# Patient Record
Sex: Male | Born: 1960 | Race: Black or African American | Hispanic: No | Marital: Single | Smoking: Never smoker
Health system: Southern US, Community
[De-identification: ages and names within clinical notes are randomized; demographics above are authoritative.]

## PROBLEM LIST (undated history)

## (undated) DIAGNOSIS — F29 Unspecified psychosis not due to a substance or known physiological condition: Secondary | ICD-10-CM

## (undated) DIAGNOSIS — I10 Essential (primary) hypertension: Secondary | ICD-10-CM

## (undated) DIAGNOSIS — R569 Unspecified convulsions: Secondary | ICD-10-CM

## (undated) DIAGNOSIS — E119 Type 2 diabetes mellitus without complications: Secondary | ICD-10-CM

## (undated) DIAGNOSIS — E785 Hyperlipidemia, unspecified: Secondary | ICD-10-CM

---

## 2012-02-02 ENCOUNTER — Ambulatory Visit: Payer: Self-pay | Admitting: Family Medicine

## 2015-10-24 ENCOUNTER — Emergency Department
Admission: EM | Admit: 2015-10-24 | Discharge: 2015-10-24 | Disposition: A | Discharge status: Home / Self Care | Payer: Medicare Other | Attending: Emergency Medicine | Admitting: Emergency Medicine

## 2015-10-24 DIAGNOSIS — L0291 Cutaneous abscess, unspecified: Secondary | ICD-10-CM

## 2015-10-24 DIAGNOSIS — L02413 Cutaneous abscess of right upper limb: Secondary | ICD-10-CM | POA: Insufficient documentation

## 2015-10-24 MED ORDER — LIDOCAINE-EPINEPHRINE (PF) 1 %-1:200000 IJ SOLN
30.0000 mL | Freq: Once | INTRAMUSCULAR | Status: DC
  Filled 2015-10-24: qty 30

## 2015-10-24 NOTE — ED Notes (Signed)
Abscess noted to right elbow. Pt caregiver states first noted 10/21/15. Today PCP recommended evaluation by ED.

## 2015-10-24 NOTE — ED Provider Notes (Signed)
[facility] Emergency Department Provider Note  ____________________________________________  Time seen: Approximately 4:26 PM  I have reviewed the triage vital signs and the nursing notes.   HISTORY  Chief Complaint Abscess   HPI Jason Reed is a 55 y.o. male who presents to the emergency department for evaluation of an abscess to the right elbow. He was evaluated at [facility] at prescribed Bactrim DS, which he started 2 days ago. Caregiver states that there has been no improvement and the area actually looks worse. He was evaluated again today by Dr. Lennox Grumbles and sent to the ER for I&D.  History reviewed. No pertinent past medical history.  There are no active problems to display for this patient.   History reviewed. No pertinent past surgical history.  No current outpatient prescriptions on file.  Allergies Review of patient's allergies indicates no known allergies.  No family history on file.  Social History Social History  Substance Use Topics  . Smoking status: Never Smoker   . Smokeless tobacco: None  . Alcohol Use: No    Review of Systems  Constitutional: Negative for fever/chills Respiratory: Negative for shortness of breath. Musculoskeletal: Positive for pain. Skin: Positive for abscess. Neurological: Negative for headaches, focal weakness or numbness. ____________________________________________   PHYSICAL EXAM:  VITAL SIGNS: ED Triage Vitals  Enc Vitals Group     BP 10/24/15 1551 145/83 mmHg     Pulse Rate 10/24/15 1551 95     Resp 10/24/15 1551 18     Temp 10/24/15 1551 97.9 F (36.6 C)     Temp Source 10/24/15 1551 Oral     SpO2 10/24/15 1551 100 %     Weight 10/24/15 1551 253 lb (114.76 kg)     Height 10/24/15 1551 6' (1.829 m)     Head Cir --      Peak Flow --      Pain Score 10/24/15 1559 7     Pain Loc --      Pain Edu? --      Excl. in [place]? --      Constitutional: Alert and oriented. Well appearing  and in no acute distress. Eyes: Conjunctivae are normal. EOMI. Nose: No congestion/rhinnorhea. Mouth/Throat: Mucous membranes are moist.   Neck: No stridor. Cardiovascular: Good peripheral circulation. Respiratory: Normal respiratory effort.  No retractions. Musculoskeletal: Limited ROM of right elbow secondary to pain. Neurologic:  Normal speech and language. No gross focal neurologic deficits are appreciated. Skin:  Large, draining abscess over the olecranon process of the right elbow with surrounding erythema and mild, localized induration on the anterior aspect of the right forearm.  ____________________________________________   LABS (all labs ordered are listed, but only abnormal results are displayed)  Labs Reviewed - No data to display ____________________________________________  EKG   ____________________________________________  RADIOLOGY  Not indicated. ____________________________________________   PROCEDURES  Procedure(s) performed:  INCISION AND DRAINAGE Performed by: Sherrie George Consent: Verbal consent obtained. Risks and benefits: risks, benefits and alternatives were discussed Type: abscess  Body area: right elbow  Anesthesia: local infiltration  Incision was made with a scalpel.  Local anesthetic: lidocaine 1% with epinephrine  Anesthetic total: 5 ml  Complexity: complex Blunt dissection to break up loculations  Drainage: purulent  Drainage amount: large  Packing material: none  Patient tolerance: Patient tolerated the procedure well with no immediate complications.    ____________________________________________   INITIAL IMPRESSION / ASSESSMENT AND PLAN / ED COURSE  Pertinent labs & imaging results that were available  during my care of the patient were reviewed by me and considered in my medical decision making (see chart for details).  He will be advised to finish the Bactrim prescribed by PCP.  He was advised to follow up  with PCP for wound check in 2 days.  He was also advised to return to the emergency department for symptoms that change or worsen if unable to schedule an appointment.  ____________________________________________   FINAL CLINICAL IMPRESSION(S) / ED DIAGNOSES  Final diagnoses:  Abscess    New Prescriptions   No medications on file     Victorino Dike, FNP 10/24/15 [place], MD 10/24/15 2007

## 2015-10-24 NOTE — Discharge Instructions (Signed)
Abscess °An abscess is an infected area that contains a collection of pus and debris. It can occur in almost any part of the body. An abscess is also known as a furuncle or boil. °CAUSES  °An abscess occurs when tissue gets infected. This can occur from blockage of oil or sweat glands, infection of hair follicles, or a minor injury to the skin. As the body tries to fight the infection, pus collects in the area and creates pressure under the skin. This pressure causes pain. People with weakened immune systems have difficulty fighting infections and get certain abscesses more often.  °SYMPTOMS °Usually an abscess develops on the skin and becomes a painful mass that is red, warm, and tender. If the abscess forms under the skin, you may feel a moveable soft area under the skin. Some abscesses break open (rupture) on their own, but most will continue to get worse without care. The infection can spread deeper into the body and eventually into the bloodstream, causing you to feel ill.  °DIAGNOSIS  °Your caregiver will take your medical history and perform a physical exam. A sample of fluid may also be taken from the abscess to determine what is causing your infection. °TREATMENT  °Your caregiver may prescribe antibiotic medicines to fight the infection. However, taking antibiotics alone usually does not cure an abscess. Your caregiver may need to make a small cut (incision) in the abscess to drain the pus. In some cases, gauze is packed into the abscess to reduce pain and to continue draining the area. °HOME CARE INSTRUCTIONS  °· Only take over-the-counter or prescription medicines for pain, discomfort, or fever as directed by your caregiver. °· If you were prescribed antibiotics, take them as directed. Finish them even if you start to feel better. °· If gauze is used, follow your caregiver's directions for changing the gauze. °· To avoid spreading the infection: °· Keep your draining abscess covered with a  bandage. °· Wash your hands well. °· Do not share personal care items, towels, or whirlpools with others. °· Avoid skin contact with others. °· Keep your skin and clothes clean around the abscess. °· Keep all follow-up appointments as directed by your caregiver. °SEEK MEDICAL CARE IF:  °· You have increased pain, swelling, redness, fluid drainage, or bleeding. °· You have muscle aches, chills, or a general ill feeling. °· You have a fever. °MAKE SURE YOU:  °· Understand these instructions. °· Will watch your condition. °· Will get help right away if you are not doing well or get worse. °  °This information is not intended to replace advice given to you by your health care provider. Make sure you discuss any questions you have with your health care provider. °  °Document Released: 01/28/2005 Document Revised: 10/20/2011 Document Reviewed: 07/03/2011 °Elsevier Interactive Patient Education ©2016 [employer] ° °Incision and Drainage °Incision and drainage is a procedure in which a sac-like structure (cystic structure) is opened and drained. The area to be drained usually contains material such as pus, fluid, or blood.  °LET YOUR CAREGIVER KNOW ABOUT:  °· Allergies to medicine. °· Medicines taken, including vitamins, herbs, eyedrops, over-the-counter medicines, and creams. °· Use of steroids (by mouth or creams). °· Previous problems with anesthetics or numbing medicines. °· History of bleeding problems or blood clots. °· Previous surgery. °· Other health problems, including diabetes and kidney problems. °· Possibility of pregnancy, if this applies. °RISKS AND COMPLICATIONS °· Pain. °· Bleeding. °· Scarring. °· Infection. °BEFORE THE PROCEDURE  °  You may need to have an ultrasound or other imaging tests to see how large or deep your cystic structure is. Blood tests may also be used to determine if you have an infection or how severe the infection is. You may need to have a tetanus shot. °PROCEDURE  °The affected area  is cleaned with a cleaning fluid. The cyst area will then be numbed with a medicine (local anesthetic). A small incision will be made in the cystic structure. A syringe or catheter may be used to drain the contents of the cystic structure, or the contents may be squeezed out. The area will then be flushed with a cleansing solution. After cleansing the area, it is often gently packed with a gauze or another wound dressing. Once it is packed, it will be covered with gauze and tape or some other type of wound dressing.  °AFTER THE PROCEDURE  °· Often, you will be allowed to go home right after the procedure. °· You may be given antibiotic medicine to prevent or heal an infection. °· If the area was packed with gauze or some other wound dressing, you will likely need to come back in 1 to 2 days to get it removed. °· The area should heal in about 14 days. °  °This information is not intended to replace advice given to you by your health care provider. Make sure you discuss any questions you have with your health care provider. °  °Document Released: 10/14/2000 Document Revised: 10/20/2011 Document Reviewed: 06/15/2011 °Elsevier Interactive Patient Education ©2016 Elsevier Inc. ° °

## 2015-10-24 NOTE — ED Notes (Addendum)
Pt arrives to ER via POV c/o right sided elbow abscess. Pt seen at [facility] 5 days ago and prescribed antibiotics. Pt elbow draining white and light yellow drainage, thick drainage, no redness at this time. No redness at site. Pt alert and oriented X4, active, cooperative, pt in NAD. RR even and unlabored, color WNL.

## 2017-01-08 ENCOUNTER — Inpatient Hospital Stay: Payer: Medicare Other

## 2017-01-08 ENCOUNTER — Inpatient Hospital Stay
Admission: EM | Admit: 2017-01-08 | Discharge: 2017-01-12 | DRG: 871 | Disposition: A | Discharge status: Home Health | Payer: Medicare Other | Attending: Internal Medicine | Admitting: Internal Medicine

## 2017-01-08 ENCOUNTER — Emergency Department: Payer: Medicare Other

## 2017-01-08 DIAGNOSIS — N390 Urinary tract infection, site not specified: Secondary | ICD-10-CM | POA: Diagnosis present

## 2017-01-08 DIAGNOSIS — I10 Essential (primary) hypertension: Secondary | ICD-10-CM | POA: Diagnosis present

## 2017-01-08 DIAGNOSIS — H578 Other specified disorders of eye and adnexa: Secondary | ICD-10-CM | POA: Diagnosis present

## 2017-01-08 DIAGNOSIS — R6521 Severe sepsis with septic shock: Secondary | ICD-10-CM | POA: Diagnosis present

## 2017-01-08 DIAGNOSIS — Z7984 Long term (current) use of oral hypoglycemic drugs: Secondary | ICD-10-CM

## 2017-01-08 DIAGNOSIS — Z7982 Long term (current) use of aspirin: Secondary | ICD-10-CM

## 2017-01-08 DIAGNOSIS — Z79899 Other long term (current) drug therapy: Secondary | ICD-10-CM | POA: Diagnosis not present

## 2017-01-08 DIAGNOSIS — R700 Elevated erythrocyte sedimentation rate: Secondary | ICD-10-CM | POA: Diagnosis present

## 2017-01-08 DIAGNOSIS — E860 Dehydration: Secondary | ICD-10-CM | POA: Diagnosis present

## 2017-01-08 DIAGNOSIS — A419 Sepsis, unspecified organism: Secondary | ICD-10-CM | POA: Diagnosis present

## 2017-01-08 DIAGNOSIS — B9620 Unspecified Escherichia coli [E. coli] as the cause of diseases classified elsewhere: Secondary | ICD-10-CM | POA: Diagnosis present

## 2017-01-08 DIAGNOSIS — E119 Type 2 diabetes mellitus without complications: Secondary | ICD-10-CM | POA: Diagnosis present

## 2017-01-08 DIAGNOSIS — F419 Anxiety disorder, unspecified: Secondary | ICD-10-CM | POA: Diagnosis present

## 2017-01-08 DIAGNOSIS — A09 Infectious gastroenteritis and colitis, unspecified: Secondary | ICD-10-CM | POA: Diagnosis present

## 2017-01-08 DIAGNOSIS — N179 Acute kidney failure, unspecified: Secondary | ICD-10-CM | POA: Diagnosis present

## 2017-01-08 DIAGNOSIS — R571 Hypovolemic shock: Secondary | ICD-10-CM | POA: Diagnosis present

## 2017-01-08 DIAGNOSIS — D61818 Other pancytopenia: Secondary | ICD-10-CM | POA: Diagnosis not present

## 2017-01-08 DIAGNOSIS — E872 Acidosis: Secondary | ICD-10-CM | POA: Diagnosis present

## 2017-01-08 DIAGNOSIS — R197 Diarrhea, unspecified: Secondary | ICD-10-CM | POA: Diagnosis not present

## 2017-01-08 DIAGNOSIS — G9341 Metabolic encephalopathy: Secondary | ICD-10-CM | POA: Diagnosis present

## 2017-01-08 HISTORY — DX: Essential (primary) hypertension: I10

## 2017-01-08 HISTORY — DX: Type 2 diabetes mellitus without complications: E11.9

## 2017-01-08 LAB — COMPREHENSIVE METABOLIC PANEL
ALT: 20 U/L (ref 17–63)
ALT: 25 U/L (ref 17–63)
AST: 35 U/L (ref 15–41)
AST: 42 U/L — AB (ref 15–41)
Albumin: 2.9 g/dL — ABNORMAL LOW (ref 3.5–5.0)
Albumin: 2.6 g/dL — ABNORMAL LOW (ref 3.5–5.0)
Alkaline Phosphatase: 90 U/L (ref 38–126)
Alkaline Phosphatase: 76 U/L (ref 38–126)
Anion gap: 15 (ref 5–15)
Anion gap: 7 (ref 5–15)
BUN: 26 mg/dL — ABNORMAL HIGH (ref 6–20)
BUN: 32 mg/dL — AB (ref 6–20)
CHLORIDE: 104 mmol/L (ref 101–111)
CHLORIDE: 111 mmol/L (ref 101–111)
CO2: 16 mmol/L — AB (ref 22–32)
CO2: 20 mmol/L — AB (ref 22–32)
CREATININE: 2.24 mg/dL — AB (ref 0.61–1.24)
Calcium: 7.6 mg/dL — ABNORMAL LOW (ref 8.9–10.3)
Calcium: 6.8 mg/dL — ABNORMAL LOW (ref 8.9–10.3)
Creatinine, Ser: 2.1 mg/dL — ABNORMAL HIGH (ref 0.61–1.24)
GFR calc Af Amer: 38 mL/min — ABNORMAL LOW (ref >60)
GFR calc non Af Amer: 31 mL/min — ABNORMAL LOW (ref >60)
GFR calc non Af Amer: 33 mL/min — ABNORMAL LOW (ref >60)
GFR, EST AFRICAN AMERICAN: 35 mL/min — AB (ref >60)
Glucose, Bld: 213 mg/dL — ABNORMAL HIGH (ref 65–99)
Glucose, Bld: 167 mg/dL — ABNORMAL HIGH (ref 65–99)
POTASSIUM: 5.1 mmol/L (ref 3.5–5.1)
Potassium: 5.4 mmol/L — ABNORMAL HIGH (ref 3.5–5.1)
SODIUM: 135 mmol/L (ref 135–145)
SODIUM: 138 mmol/L (ref 135–145)
Total Bilirubin: 1.1 mg/dL (ref 0.3–1.2)
Total Bilirubin: 1.1 mg/dL (ref 0.3–1.2)
Total Protein: 5.9 g/dL — ABNORMAL LOW (ref 6.5–8.1)
Total Protein: 5.3 g/dL — ABNORMAL LOW (ref 6.5–8.1)

## 2017-01-08 LAB — URINALYSIS, ROUTINE W REFLEX MICROSCOPIC
Glucose, UA: 150 mg/dL — AB
KETONES UR: NEGATIVE mg/dL
Nitrite: NEGATIVE
PH: 5 (ref 5.0–8.0)
Protein, ur: 30 mg/dL — AB
SQUAMOUS EPITHELIAL / LPF: NONE SEEN
Specific Gravity, Urine: 1.012 (ref 1.005–1.030)

## 2017-01-08 LAB — CBC WITH DIFFERENTIAL/PLATELET
BASOS ABS: 0.1 10*3/uL (ref 0–0.1)
Basophils Relative: 1 %
Eosinophils Absolute: 0 10*3/uL (ref 0–0.7)
Eosinophils Relative: 0 %
HCT: 36 % — ABNORMAL LOW (ref 40.0–52.0)
HEMOGLOBIN: 11.6 g/dL — AB (ref 13.0–18.0)
LYMPHS PCT: 4 %
Lymphs Abs: 0.5 10*3/uL — ABNORMAL LOW (ref 1.0–3.6)
MCH: 30.1 pg (ref 26.0–34.0)
MCHC: 32.2 g/dL (ref 32.0–36.0)
MCV: 93.7 fL (ref 80.0–100.0)
Monocytes Absolute: 1.2 10*3/uL — ABNORMAL HIGH (ref 0.2–1.0)
Monocytes Relative: 11 %
NEUTROS ABS: 9.3 10*3/uL — AB (ref 1.4–6.5)
NEUTROS PCT: 84 %
Platelets: 161 10*3/uL (ref 150–440)
RBC: 3.85 MIL/uL — ABNORMAL LOW (ref 4.40–5.90)
RDW: 16 % — ABNORMAL HIGH (ref 11.5–14.5)
WBC: 11.1 10*3/uL — AB (ref 3.8–10.6)

## 2017-01-08 LAB — LIPASE, BLOOD: Lipase: 21 U/L (ref 11–51)

## 2017-01-08 LAB — TYPE AND SCREEN
ABO/RH(D): O POS
Antibody Screen: NEGATIVE

## 2017-01-08 LAB — GASTROINTESTINAL PANEL BY PCR, STOOL (REPLACES STOOL CULTURE)

## 2017-01-08 LAB — CBC
HCT: 32 % — ABNORMAL LOW (ref 40.0–52.0)
Hemoglobin: 10.4 g/dL — ABNORMAL LOW (ref 13.0–18.0)
MCH: 29.8 pg (ref 26.0–34.0)
MCHC: 32.5 g/dL (ref 32.0–36.0)
MCV: 91.6 fL (ref 80.0–100.0)
PLATELETS: 116 10*3/uL — AB (ref 150–440)
RBC: 3.5 MIL/uL — AB (ref 4.40–5.90)
RDW: 15.4 % — ABNORMAL HIGH (ref 11.5–14.5)
WBC: 3.4 10*3/uL — AB (ref 3.8–10.6)

## 2017-01-08 LAB — PHOSPHORUS
PHOSPHORUS: 3.7 mg/dL (ref 2.5–4.6)
Phosphorus: 3.4 mg/dL (ref 2.5–4.6)

## 2017-01-08 LAB — MAGNESIUM
Magnesium: 2.3 mg/dL (ref 1.7–2.4)
Magnesium: 1.9 mg/dL (ref 1.7–2.4)

## 2017-01-08 LAB — LACTIC ACID, PLASMA
LACTIC ACID, VENOUS: 6.3 mmol/L — AB (ref 0.5–1.9)
Lactic Acid, Venous: 5.8 mmol/L (ref 0.5–1.9)
Lactic Acid, Venous: 3.9 mmol/L (ref 0.5–1.9)

## 2017-01-08 LAB — MRSA PCR SCREENING: MRSA BY PCR: NEGATIVE

## 2017-01-08 LAB — C DIFFICILE QUICK SCREEN W PCR REFLEX
C DIFFICILE (CDIFF) TOXIN: NEGATIVE
C Diff antigen: NEGATIVE
C Diff interpretation: NOT DETECTED

## 2017-01-08 LAB — PROTIME-INR
INR: 1.11
PROTHROMBIN TIME: 14.2 s (ref 11.4–15.2)

## 2017-01-08 LAB — GLUCOSE, CAPILLARY
GLUCOSE-CAPILLARY: 179 mg/dL — AB (ref 65–99)
Glucose-Capillary: 177 mg/dL — ABNORMAL HIGH (ref 65–99)

## 2017-01-08 LAB — TROPONIN I: Troponin I: <0.03 ng/mL (ref <0.03)

## 2017-01-08 MED ORDER — MIRTAZAPINE 15 MG PO TBDP
15.0000 mg | ORAL_TABLET | Freq: Every day | ORAL | Status: DC
  Administered 2017-01-08 – 2017-01-11 (×4): 15 mg via ORAL
  Filled 2017-01-08 (×5): qty 1

## 2017-01-08 MED ORDER — TRAZODONE HCL 50 MG PO TABS
50.0000 mg | ORAL_TABLET | Freq: Every day | ORAL | Status: DC
  Administered 2017-01-09 – 2017-01-11 (×3): 50 mg via ORAL
  Filled 2017-01-08 (×3): qty 1

## 2017-01-08 MED ORDER — CIPROFLOXACIN IN D5W 400 MG/200ML IV SOLN
400.0000 mg | Freq: Two times a day (BID) | INTRAVENOUS | Status: DC
  Administered 2017-01-09 – 2017-01-11 (×5): 400 mg via INTRAVENOUS
  Filled 2017-01-08 (×6): qty 200

## 2017-01-08 MED ORDER — SODIUM CHLORIDE 0.9 % IV BOLUS (SEPSIS)
1000.0000 mL | Freq: Once | INTRAVENOUS | Status: AC
  Administered 2017-01-08: 1000 mL via INTRAVENOUS

## 2017-01-08 MED ORDER — INSULIN ASPART 100 UNIT/ML ~~LOC~~ SOLN
0.0000 [IU] | Freq: Three times a day (TID) | SUBCUTANEOUS | Status: DC
  Administered 2017-01-09 – 2017-01-10 (×3): 1 [IU] via SUBCUTANEOUS
  Administered 2017-01-11: 12:00:00 2 [IU] via SUBCUTANEOUS
  Administered 2017-01-11: 18:00:00 1 [IU] via SUBCUTANEOUS
  Administered 2017-01-11: 09:00:00 2 [IU] via SUBCUTANEOUS
  Administered 2017-01-12: 09:00:00 1 [IU] via SUBCUTANEOUS
  Administered 2017-01-12: 2 [IU] via SUBCUTANEOUS
  Filled 2017-01-08 (×8): qty 1

## 2017-01-08 MED ORDER — DEXTROSE 5 % IV SOLN
0.0000 ug/min | Freq: Once | INTRAVENOUS | Status: AC
Start: 2017-01-08 — End: 2017-01-09
  Administered 2017-01-08: 5 ug/min via INTRAVENOUS
  Filled 2017-01-08: qty 4

## 2017-01-08 MED ORDER — ARIPIPRAZOLE 15 MG PO TABS
15.0000 mg | ORAL_TABLET | Freq: Every day | ORAL | Status: DC
  Administered 2017-01-09 – 2017-01-12 (×4): 15 mg via ORAL
  Filled 2017-01-08 (×4): qty 1

## 2017-01-08 MED ORDER — PHENYTOIN SODIUM EXTENDED 100 MG PO CAPS
100.0000 mg | ORAL_CAPSULE | Freq: Two times a day (BID) | ORAL | Status: DC
  Administered 2017-01-08 – 2017-01-12 (×8): 100 mg via ORAL
  Filled 2017-01-08 (×9): qty 1

## 2017-01-08 MED ORDER — ONDANSETRON HCL 4 MG PO TABS: 4.0000 mg | ORAL_TABLET | Freq: Four times a day (QID) | ORAL | Status: DC | PRN

## 2017-01-08 MED ORDER — SIMVASTATIN 20 MG PO TABS
20.0000 mg | ORAL_TABLET | Freq: Every day | ORAL | Status: DC
  Administered 2017-01-08 – 2017-01-11 (×4): 20 mg via ORAL
  Filled 2017-01-08 (×4): qty 1

## 2017-01-08 MED ORDER — GUAIFENESIN-DM 100-10 MG/5ML PO SYRP
10.0000 mL | ORAL_SOLUTION | ORAL | Status: DC | PRN
  Filled 2017-01-08: qty 10

## 2017-01-08 MED ORDER — SODIUM CHLORIDE 0.9 % IV BOLUS (SEPSIS)
1000.0000 mL | INTRAVENOUS | Status: AC
  Administered 2017-01-08 – 2017-01-09 (×2): 1000 mL via INTRAVENOUS

## 2017-01-08 MED ORDER — CIPROFLOXACIN IN D5W 400 MG/200ML IV SOLN: 400.0000 mg | Freq: Once | INTRAVENOUS | Status: DC

## 2017-01-08 MED ORDER — LORATADINE 10 MG PO TABS
10.0000 mg | ORAL_TABLET | Freq: Every day | ORAL | Status: DC
Start: 2017-01-09 — End: 2017-01-12
  Administered 2017-01-10 – 2017-01-12 (×3): 10 mg via ORAL
  Filled 2017-01-08 (×4): qty 1

## 2017-01-08 MED ORDER — QUETIAPINE FUMARATE 200 MG PO TABS
800.0000 mg | ORAL_TABLET | Freq: Every day | ORAL | Status: DC
  Administered 2017-01-09 – 2017-01-11 (×3): 800 mg via ORAL
  Filled 2017-01-08: qty 2
  Filled 2017-01-08 (×3): qty 4

## 2017-01-08 MED ORDER — ENOXAPARIN SODIUM 40 MG/0.4ML ~~LOC~~ SOLN
40.0000 mg | SUBCUTANEOUS | Status: DC
  Administered 2017-01-08 – 2017-01-11 (×4): 40 mg via SUBCUTANEOUS
  Filled 2017-01-08 (×4): qty 0.4

## 2017-01-08 MED ORDER — SODIUM CHLORIDE 0.9 % IV SOLN
600.0000 mg | Freq: Four times a day (QID) | INTRAVENOUS | Status: DC | PRN
  Administered 2017-01-08 – 2017-01-09 (×2): 600 mg via INTRAVENOUS
  Filled 2017-01-08 (×2): qty 6

## 2017-01-08 MED ORDER — ONDANSETRON HCL 4 MG/2ML IJ SOLN: 4.0000 mg | Freq: Four times a day (QID) | INTRAMUSCULAR | Status: DC | PRN

## 2017-01-08 MED ORDER — PIPERACILLIN-TAZOBACTAM 3.375 G IVPB 30 MIN
3.3750 g | Freq: Once | INTRAVENOUS | Status: AC
  Administered 2017-01-08: 3.375 g via INTRAVENOUS

## 2017-01-08 MED ORDER — POTASSIUM CHLORIDE IN NACL 20-0.9 MEQ/L-% IV SOLN
INTRAVENOUS | Status: DC
  Administered 2017-01-08: 21:00:00 via INTRAVENOUS
  Filled 2017-01-08 (×2): qty 1000

## 2017-01-08 MED ORDER — SODIUM CHLORIDE 0.9 % IV BOLUS (SEPSIS)
500.0000 mL | Freq: Once | INTRAVENOUS | Status: AC
  Administered 2017-01-08: 500 mL via INTRAVENOUS

## 2017-01-08 MED ORDER — VANCOMYCIN HCL IN DEXTROSE 1-5 GM/200ML-% IV SOLN
1000.0000 mg | Freq: Once | INTRAVENOUS | Status: AC
  Administered 2017-01-08: 1000 mg via INTRAVENOUS

## 2017-01-08 MED ORDER — SODIUM CHLORIDE 0.9 % IV SOLN
INTRAVENOUS | Status: DC
  Administered 2017-01-08 – 2017-01-11 (×7): via INTRAVENOUS

## 2017-01-08 MED ORDER — ACETAMINOPHEN 650 MG RE SUPP: 650.0000 mg | Freq: Four times a day (QID) | RECTAL | Status: DC | PRN

## 2017-01-08 MED ORDER — METRONIDAZOLE IN NACL 5-0.79 MG/ML-% IV SOLN
500.0000 mg | Freq: Three times a day (TID) | INTRAVENOUS | Status: DC
  Administered 2017-01-09 (×3): 500 mg via INTRAVENOUS
  Filled 2017-01-08 (×4): qty 100

## 2017-01-08 MED ORDER — ASPIRIN EC 81 MG PO TBEC
81.0000 mg | DELAYED_RELEASE_TABLET | Freq: Every day | ORAL | Status: DC
  Administered 2017-01-09 – 2017-01-12 (×4): 81 mg via ORAL
  Filled 2017-01-08 (×4): qty 1

## 2017-01-08 MED ORDER — ONDANSETRON 4 MG PO TBDP
4.0000 mg | ORAL_TABLET | Freq: Four times a day (QID) | ORAL | Status: DC | PRN
  Filled 2017-01-08: qty 1

## 2017-01-08 MED ORDER — ACETAMINOPHEN 325 MG PO TABS
650.0000 mg | ORAL_TABLET | Freq: Four times a day (QID) | ORAL | Status: DC | PRN
  Administered 2017-01-09: 650 mg via ORAL
  Filled 2017-01-08: qty 2

## 2017-01-08 MED ORDER — CLONAZEPAM 0.5 MG PO TABS: 1.0000 mg | ORAL_TABLET | Freq: Three times a day (TID) | ORAL | Status: DC | PRN

## 2017-01-08 MED ORDER — SENNOSIDES-DOCUSATE SODIUM 8.6-50 MG PO TABS: 1.0000 | ORAL_TABLET | Freq: Every evening | ORAL | Status: DC | PRN

## 2017-01-08 MED ORDER — CHLORHEXIDINE GLUCONATE 0.12 % MT SOLN
15.0000 mL | Freq: Two times a day (BID) | OROMUCOSAL | Status: DC
  Administered 2017-01-08 – 2017-01-09 (×3): 15 mL via OROMUCOSAL
  Filled 2017-01-08 (×3): qty 15

## 2017-01-08 MED ORDER — GABAPENTIN 300 MG PO CAPS
300.0000 mg | ORAL_CAPSULE | Freq: Three times a day (TID) | ORAL | Status: DC
  Administered 2017-01-08 – 2017-01-12 (×11): 300 mg via ORAL
  Filled 2017-01-08 (×11): qty 1

## 2017-01-08 NOTE — ED Notes (Signed)
Pt able to state his name, unsure where he is.

## 2017-01-08 NOTE — Progress Notes (Signed)
eLink Physician-Brief Progress Note Patient Name: Jason Reed DOB: 09/02/1960 MRN: [id number]   Date of Service  01/08/2017  HPI/Events of Note  56 yo male with unknown PMH. Presents with diarrhea and ALOC. Patient already seen in consultation by PCCM. VSS.   eICU Interventions  No new orders.      Intervention Category Evaluation Type: New Patient Evaluation  Lysle Dingwall 01/08/2017, 7:28 PM

## 2017-01-08 NOTE — H&P (Signed)
[place] [facility] [place] NAME: Jason Reed    MR#:  [id number]  DATE OF BIRTH:  [birth year]  DATE OF ADMISSION:  01/08/2017  PRIMARY CARE PHYSICIAN: Patient, No Pcp Per   REQUESTING/REFERRING PHYSICIAN: dr Corky Downs  CHIEF COMPLAINT:   Diarrhea and AMS HISTORY OF PRESENT ILLNESS:  Jason Reed  is a 56 y.o. male with a known history of Diabetes and essential hypertension who comes in group home due to profuse diarrhea and altered mental status. Apparently this morning patient woke up with profuse diarrhea. His blood pressure was reported to be 73/35 with heart rate 116 and he had some confusion. He was brought to the ER for further evaluation where he was noted to have persistent hypotension. He is mentating however he is very slow to answer any questions. He has been given a total of 4-1/2 L of fluid however his systolic blood pressure remains in the 70s to 80s.   PAST MEDICAL HISTORY:  Essential hypertension Diabetes Anxiety  PAST SURGICAL HISTORY:  No surgical history  SOCIAL HISTORY:   Social History  Substance Use Topics  . Smoking status: Never Smoker  . Smokeless tobacco: no  . Alcohol use No    FAMILY HISTORY:  Patient unable to answer this  DRUG ALLERGIES:   Allergies  Allergen Reactions  . Lisinopril     Unknown    REVIEW OF SYSTEMS:   Review of Systems  Unable to perform ROS: Acuity of condition    MEDICATIONS AT HOME:   Prior to Admission medications   Medication Sig Start Date End Date Taking? Authorizing Provider  acetaminophen (TYLENOL) 325 MG tablet Take 650 mg by mouth every 6 (six) hours as needed.   Yes [provider]  ARIPiprazole (ABILIFY) 15 MG tablet Take 15 mg by mouth daily.   Yes [provider]  aspirin EC 81 MG tablet Take 81 mg by mouth daily.   Yes [provider]  chlorhexidine (PERIDEX) 0.12 % solution Use as directed 15 mLs in the mouth or throat 2 (two)  times daily.   Yes [provider]  clonazePAM (KLONOPIN) 1 MG tablet Take 1 mg by mouth 3 (three) times daily as needed for anxiety. Take 1 mg by mouth every 12 hours as needed and take 1 mg by mouth at bedtime.   Yes [provider]  gabapentin (NEURONTIN) 300 MG capsule Take 300 mg by mouth 3 (three) times daily.   Yes [provider]  guaiFENesin-dextromethorphan (ROBITUSSIN DM) 100-10 MG/5ML syrup Take 10 mLs by mouth every 4 (four) hours as needed for cough.   Yes [provider]  hydrochlorothiazide (HYDRODIURIL) 25 MG tablet Take 25 mg by mouth daily.   Yes [provider]  hydrocortisone cream 1 % Apply 1 application topically 2 (two) times daily.   Yes [provider]  loratadine (CLARITIN) 10 MG tablet Take 10 mg by mouth daily.   Yes [provider]  losartan (COZAAR) 100 MG tablet Take 100 mg by mouth daily.   Yes [provider]  metFORMIN (GLUCOPHAGE) 500 MG tablet Take 500 mg by mouth 2 (two) times daily with a meal.   Yes [provider]  metoprolol succinate (TOPROL-XL) 25 MG 24 hr tablet Take 25 mg by mouth daily.   Yes [provider]  mirtazapine (REMERON SOL-TAB) 15 MG disintegrating tablet Take 15 mg by mouth at bedtime.   Yes [provider]  ondansetron (ZOFRAN-ODT) 4  MG disintegrating tablet Take 4 mg by mouth every 6 (six) hours as needed for nausea or vomiting.   Yes [provider]  phenytoin (DILANTIN) 100 MG ER capsule Take 100-300 mg by mouth 2 (two) times daily. Take 100 mg by mouth in the morning and 300 mg by mouth at bedtime.   Yes [provider]  psyllium (METAMUCIL) 58.6 % powder Take 1 packet by mouth daily.   Yes [provider]  QUEtiapine (SEROQUEL) 400 MG tablet Take 800 mg by mouth at bedtime.   Yes [provider]  simvastatin (ZOCOR) 20 MG tablet Take 20 mg by mouth at bedtime.   Yes [provider]  traZODone  (DESYREL) 50 MG tablet Take 50 mg by mouth at bedtime.   Yes [provider]      VITAL SIGNS:  Blood pressure (!) 87/40, pulse (!) 108, temperature 99.5 F (37.5 C), resp. rate 18, height 6' (1.829 m), weight 113.4 kg (250 lb), SpO2 100 %.  PHYSICAL EXAMINATION:   Physical Exam  Constitutional: He is well-developed, well-nourished, and in no distress. No distress.  Patient looks critically ill. Severely dehydrated. Tongue is very dry  HENT:  Head: Normocephalic.  Right eyelid swollen  Eyes: No scleral icterus.  Neck: Normal range of motion. Neck supple. No JVD present. No tracheal deviation present.  Cardiovascular: Normal rate, regular rhythm and normal heart sounds.  Exam reveals no gallop and no friction rub.   No murmur heard. Tachycardia  Pulmonary/Chest: Effort normal and breath sounds normal. No respiratory distress. He has no wheezes. He has no rales. He exhibits no tenderness.  Abdominal: Soft. Bowel sounds are normal. He exhibits no distension and no mass. There is tenderness. There is no rebound and no guarding.  Musculoskeletal: Normal range of motion. He exhibits edema.  Neurological: He is alert.  Oriented to name and place not time Very slow to answer any questions  Skin: Skin is warm. No rash noted. No erythema.  Psychiatric:  Very slow to answer questions      LABORATORY PANEL:   CBC  Recent Labs Lab 01/08/17 1353  WBC 11.1*  HGB 11.6*  HCT 36.0*  PLT 161   ------------------------------------------------------------------------------------------------------------------  Chemistries   Recent Labs Lab 01/08/17 1353  NA 135  K 5.1  CL 104  CO2 16*  GLUCOSE 213*  BUN 26*  CREATININE 2.24*  CALCIUM 7.6*  AST 35  ALT 20  ALKPHOS 90  BILITOT 1.1   ------------------------------------------------------------------------------------------------------------------  Cardiac Enzymes  Recent Labs Lab 01/08/17 1353  TROPONINI <0.03    ------------------------------------------------------------------------------------------------------------------  RADIOLOGY:  Dg Chest Port 1 View  Result Date: 01/08/2017 CLINICAL DATA:  Weakness EXAM: PORTABLE CHEST 1 VIEW COMPARISON:  None. FINDINGS: Hypoventilation with mild bibasilar atelectasis. Negative for heart failure or effusion IMPRESSION: Hypoventilation with mild bibasilar atelectasis. Electronically Signed   By: Franchot Gallo M.D.   On: 01/08/2017 14:55    EKG:  No orders found for this or any previous visit.  IMPRESSION AND PLAN:   56 year old male with history of essential hypertension and diabetes from group home who presents with hypovolemic shock due to profuse diarrhea.  1. Hypovolemic shock due to profuse diarrhea Empiric Cipro and Flagyl started Follow up in stool cultures as well as C. Difficile Continue aggressive hydration with IV fluids Patient will need central line and pressors to be started to keep map greater than 65 Follow-up on urine analysis as well to rule out septic shock Check CT of  the abdomen without contrast   2. Acute metabolic encephalopathy in the setting hypovolemic shock Patient's mental status is slowly clearing Consider head CT if mental status does not improve after fluids in a.m.  3. Diabetes: Sliding scale insulin started  4. Essential hypertension: Due to problem #1 I will discontinue all hypertensive medications at this time. 5. Acute kidney injury in the setting of hypovolemic shock and profound diarrhea Continue aggressive hydration BMP in a.m. Consider renal ultrasound if creatinine has not improved in a.m. Hold nephrotoxic medications  6. Right eye swelling: Patient states he has had right eye swelling I would continue to monitor to make sure he doesn't have any vision or changes due to current mental status, I am unable to obtain much information about the right eye   All the records are reviewed and case  discussed with ED provider. Management plans discussed with the patient and he is in agreement  CODE STATUS: FULL  Critical careTOTAL TIME TAKING CARE OF THIS PATIENT: 50 minutes.   Case discussed with Dr. Mortimer Fries   Amarria Andreasen, Ulice Bold M.D on 01/08/2017 at 3:31 PM  Between 7am to 6pm - Pager - [phone]  After 6pm go to [email or web address] - password [email or web address] [facility]  Office  [phone]  CC: Primary care physician; Patient, No Pcp Per

## 2017-01-08 NOTE — ED Triage Notes (Signed)
Pt came to ED via EMS, found in a chair in a field. From group home, unsure where. Pt arrived to ED covered in stool, bp 75/35, HR 116, minimally responsive.

## 2017-01-08 NOTE — Progress Notes (Signed)
Pharmacy Antibiotic Note  Jason Reed is a 56 y.o. male admitted on 01/08/2017 with intraabdominal infection.  Pharmacy has been consulted for Cipro dosing.  Plan: Will start Cipro 400 mg IV q12 hours  Height: 6' (182.9 cm) Weight: 250 lb (113.4 kg) IBW/kg (Calculated) : 77.6  Temp (24hrs), Avg:99.6 F (37.6 C), Min:99.3 F (37.4 C), Max:100.1 F (37.8 C)   Recent Labs Lab 01/08/17 1353  WBC 11.1*  CREATININE 2.24*  LATICACIDVEN 6.3*    Estimated Creatinine Clearance: 46.7 mL/min (A) (by C-G formula based on SCr of 2.24 mg/dL (H)).    Allergies  Allergen Reactions  . Lisinopril     Unknown    Antimicrobials this admission: Cipro 9/7 >>  Metronidazole 9/7 >>   Dose adjustments this admission:   Microbiology results:  BCx:   UCx:    Sputum:    MRSA PCR:   Thank you for allowing pharmacy to be a part of this patient's care.  Conn Trombetta D 01/08/2017 3:40 PM

## 2017-01-08 NOTE — ED Notes (Signed)
Called Dr Jenell Milliner for orders related to patient's decreasing MAP. Unable to contact.

## 2017-01-08 NOTE — ED Provider Notes (Signed)
[facility] Emergency Department Provider Note   ____________________________________________    I have reviewed the triage vital signs and the nursing notes.   HISTORY  Chief Complaint Code Sepsis   Altered mental status limits history  HPI Jason Reed is a 56 y.o. male who presents with altered mental status. EMS reports patient is from a group home and developed copious diarrhea this morning and has become more lethargic throughout the day. They report he is febrile. No further history is available    History reviewed. No pertinent past medical history.  There are no active problems to display for this patient.   History reviewed. No pertinent surgical history.  Prior to Admission medications   Medication Sig Start Date End Date Taking? Authorizing Provider  acetaminophen (TYLENOL) 325 MG tablet Take 650 mg by mouth every 6 (six) hours as needed.   Yes [provider]  ARIPiprazole (ABILIFY) 15 MG tablet Take 15 mg by mouth daily.   Yes [provider]  aspirin EC 81 MG tablet Take 81 mg by mouth daily.   Yes [provider]  chlorhexidine (PERIDEX) 0.12 % solution Use as directed 15 mLs in the mouth or throat 2 (two) times daily.   Yes [provider]  clonazePAM (KLONOPIN) 1 MG tablet Take 1 mg by mouth 3 (three) times daily as needed for anxiety. Take 1 mg by mouth every 12 hours as needed and take 1 mg by mouth at bedtime.   Yes [provider]  gabapentin (NEURONTIN) 300 MG capsule Take 300 mg by mouth 3 (three) times daily.   Yes [provider]  guaiFENesin-dextromethorphan (ROBITUSSIN DM) 100-10 MG/5ML syrup Take 10 mLs by mouth every 4 (four) hours as needed for cough.   Yes [provider]  hydrochlorothiazide (HYDRODIURIL) 25 MG tablet Take 25 mg by mouth daily.   Yes [provider]  hydrocortisone cream 1 % Apply 1 application topically 2 (two) times daily.    Yes [provider]  loratadine (CLARITIN) 10 MG tablet Take 10 mg by mouth daily.   Yes [provider]  losartan (COZAAR) 100 MG tablet Take 100 mg by mouth daily.   Yes [provider]  metFORMIN (GLUCOPHAGE) 500 MG tablet Take 500 mg by mouth 2 (two) times daily with a meal.   Yes [provider]  metoprolol succinate (TOPROL-XL) 25 MG 24 hr tablet Take 25 mg by mouth daily.   Yes [provider]  mirtazapine (REMERON SOL-TAB) 15 MG disintegrating tablet Take 15 mg by mouth at bedtime.   Yes [provider]  ondansetron (ZOFRAN-ODT) 4 MG disintegrating tablet Take 4 mg by mouth every 6 (six) hours as needed for nausea or vomiting.   Yes [provider]  phenytoin (DILANTIN) 100 MG ER capsule Take 100-300 mg by mouth 2 (two) times daily. Take 100 mg by mouth in the morning and 300 mg by mouth at bedtime.   Yes [provider]  psyllium (METAMUCIL) 58.6 % powder Take 1 packet by mouth daily.   Yes [provider]  QUEtiapine (SEROQUEL) 400 MG tablet Take 800 mg by mouth at bedtime.   Yes [provider]  simvastatin (ZOCOR) 20 MG tablet Take 20 mg by mouth at bedtime.   Yes [provider]  traZODone (DESYREL) 50 MG tablet Take 50 mg by mouth at bedtime.   Yes [provider]     Allergies Lisinopril  No family history on  file.  Social History Social History  Substance Use Topics  . Smoking status: Never Smoker  . Smokeless tobacco: Not on file  . Alcohol use No    Level V caveat: Unable to obtain Review of Systems due to altered mental status     ____________________________________________   PHYSICAL EXAM:  VITAL SIGNS: ED Triage Vitals  Enc Vitals Group     BP 01/08/17 1400 (!) 77/32     Pulse Rate 01/08/17 1400 (!) 123     Resp 01/08/17 1400 (!) 31     Temp 01/08/17 1405 100.1 F (37.8 C)     Temp Source 01/08/17 1405 Rectal     SpO2 01/08/17 1400 94 %      Weight 01/08/17 1423 113.4 kg (250 lb)     Height 01/08/17 1423 1.829 m (6')     Head Circumference --      Peak Flow --      Pain Score --      Pain Loc --      Pain Edu? --      Excl. in [place]? --     Constitutional: Arousable.  Eyes: Conjunctivae are normal.  Head: Atraumatic. Nose: No congestion/rhinnorhea. Mouth/Throat: Mucous membranes are dry    Cardiovascular: Tachycardia, regular rhythm. Grossly normal heart sounds.  Good peripheral circulation. Respiratory: Normal respiratory effort.  No retractions. Lungs CTAB. Gastrointestinal: Soft and nontender. No distention.  No CVA tenderness. Foul-smelling diarrhea  Musculoskeletal:   Warm and well perfused Neurologic:   No gross focal neurologic deficits are appreciated.  Skin:  Skin is warm, dry and intact. No rash noted. Psychiatric: Unable to examine  ____________________________________________   LABS (all labs ordered are listed, but only abnormal results are displayed)  Labs Reviewed  LACTIC ACID, PLASMA - Abnormal; Notable for the following:       Result Value   Lactic Acid, Venous 6.3 (*)    All other components within normal limits  CBC WITH DIFFERENTIAL/PLATELET - Abnormal; Notable for the following:    WBC 11.1 (*)    RBC 3.85 (*)    Hemoglobin 11.6 (*)    HCT 36.0 (*)    RDW 16.0 (*)    Neutro Abs 9.3 (*)    Lymphs Abs 0.5 (*)    Monocytes Absolute 1.2 (*)    All other components within normal limits  CULTURE, BLOOD (ROUTINE X 2)  CULTURE, BLOOD (ROUTINE X 2)  URINE CULTURE  GASTROINTESTINAL PANEL BY PCR, STOOL (REPLACES STOOL CULTURE)  C DIFFICILE QUICK SCREEN W PCR REFLEX  LIPASE, BLOOD  TROPONIN I  PROTIME-INR  LACTIC ACID, PLASMA  URINALYSIS, ROUTINE W REFLEX MICROSCOPIC  COMPREHENSIVE METABOLIC PANEL  TYPE AND SCREEN  TYPE AND SCREEN   ____________________________________________  EKG  ED ECG REPORT I, Lavonia Drafts, the attending physician, personally viewed and interpreted this  ECG.  Date: 01/08/2017   Rhythm: Sinus tachycardia QRS Axis: normal Intervals: normal ST/T Wave abnormalities: Nonspecific   ____________________________________________  RADIOLOGY  Chest x-ray unremarkable ____________________________________________   PROCEDURES  Procedure(s) performed: No    Critical Care performed: yes  CRITICAL CARE Performed by: Lavonia Drafts   Total critical care time: 35 minutes  Critical care time was exclusive of separately billable procedures and treating other patients.  Critical care was necessary to treat or prevent imminent or life-threatening deterioration.  Critical care was time spent personally by me on the following activities: development of treatment plan with patient and/or surrogate as well as nursing, discussions with  consultants, evaluation of patient's response to treatment, examination of patient, obtaining history from patient or surrogate, ordering and performing treatments and interventions, ordering and review of laboratory studies, ordering and review of radiographic studies, pulse oximetry and re-evaluation of patient's condition.  ____________________________________________   INITIAL IMPRESSION / ASSESSMENT AND PLAN / ED COURSE  Pertinent labs & imaging results that were available during my care of the patient were reviewed by me and considered in my medical decision making (see chart for details).  Patient presents with altered mental status likely related to hypotension. Patient is febrile and tachycardic. Suspect severe fluid losses from diarrhea combined with sepsis as the cause of his septic shock. 30 MLS per kilogram normal saline started. Broad-spectrum antibiotics ordered.  ----------------------------------------- 3:17 PM on 01/08/2017 -----------------------------------------  Patient has had significant improvement in blood pressure with fluids. Heart rate has decreased, blood pressure has increased  to a map of 70  Sepsis - Repeat Assessment  Performed at:    1515  Vitals     Blood pressure (!) 87/40, pulse (!) 108, temperature 99.5 F (37.5 C), resp. rate 18, height 1.829 m (6'), weight 113.4 kg (250 lb), SpO2 100 %.  Heart:     Tachycardic  Lungs:    CTA  Capillary Refill:   <2 sec  Peripheral Pulse:   Radial pulse palpable  Skin:     Normal Color    Discussed with hospitalist for admission, also spoke with Dr. Mortimer Fries of ICU, recommended additional fluids      ____________________________________________   FINAL CLINICAL IMPRESSION(S) / ED DIAGNOSES  Final diagnoses:  Septic shock ([place])      NEW MEDICATIONS STARTED DURING THIS VISIT:  New Prescriptions   No medications on file     Note:  This document was prepared using Dragon voice recognition software and may include unintentional dictation errors.    Lavonia Drafts, MD 01/08/17 [phone]

## 2017-01-08 NOTE — Consult Note (Signed)
Name: Jason Reed MRN: [id number] DOB: [birth year]     CONSULTATION DATE: 01/08/2017  REFERRING MD : Benjie Karvonen  CHIEF COMPLAINT:  Diarrhea, pain    HISTORY OF PRESENT ILLNESS:  56 y.o. male who presents with altered mental status. EMS reports patient is from a group home and developed copious diarrhea this morning and has become more lethargic throughout the day. They report he is febrile. No further history is available   He looks ill, patient lethargic, unable to provide complete ROS +rogors and chills LA 6, has ARF with creat 2.3 BP low, patient given several liters of IVF's approx 3 Liters  PAST MEDICAL HISTORY :   has no past medical history on file.  has no past surgical history on file. Prior to Admission medications   Medication Sig Start Date End Date Taking? Authorizing Provider  acetaminophen (TYLENOL) 325 MG tablet Take 650 mg by mouth every 6 (six) hours as needed.   Yes [provider]  ARIPiprazole (ABILIFY) 15 MG tablet Take 15 mg by mouth daily.   Yes [provider]  aspirin EC 81 MG tablet Take 81 mg by mouth daily.   Yes [provider]  chlorhexidine (PERIDEX) 0.12 % solution Use as directed 15 mLs in the mouth or throat 2 (two) times daily.   Yes [provider]  clonazePAM (KLONOPIN) 1 MG tablet Take 1 mg by mouth 3 (three) times daily as needed for anxiety. Take 1 mg by mouth every 12 hours as needed and take 1 mg by mouth at bedtime.   Yes [provider]  gabapentin (NEURONTIN) 300 MG capsule Take 300 mg by mouth 3 (three) times daily.   Yes [provider]  guaiFENesin-dextromethorphan (ROBITUSSIN DM) 100-10 MG/5ML syrup Take 10 mLs by mouth every 4 (four) hours as needed for cough.   Yes [provider]  hydrochlorothiazide (HYDRODIURIL) 25 MG tablet Take 25 mg by mouth daily.   Yes [provider]  hydrocortisone cream 1 % Apply 1 application topically 2 (two) times daily.   Yes  [provider]  loratadine (CLARITIN) 10 MG tablet Take 10 mg by mouth daily.   Yes [provider]  losartan (COZAAR) 100 MG tablet Take 100 mg by mouth daily.   Yes [provider]  metFORMIN (GLUCOPHAGE) 500 MG tablet Take 500 mg by mouth 2 (two) times daily with a meal.   Yes [provider]  metoprolol succinate (TOPROL-XL) 25 MG 24 hr tablet Take 25 mg by mouth daily.   Yes [provider]  mirtazapine (REMERON SOL-TAB) 15 MG disintegrating tablet Take 15 mg by mouth at bedtime.   Yes [provider]  ondansetron (ZOFRAN-ODT) 4 MG disintegrating tablet Take 4 mg by mouth every 6 (six) hours as needed for nausea or vomiting.   Yes [provider]  phenytoin (DILANTIN) 100 MG ER capsule Take 100-300 mg by mouth 2 (two) times daily. Take 100 mg by mouth in the morning and 300 mg by mouth at bedtime.   Yes [provider]  psyllium (METAMUCIL) 58.6 % powder Take 1 packet by mouth daily.   Yes [provider]  QUEtiapine (SEROQUEL) 400 MG tablet Take 800 mg by mouth at bedtime.   Yes [provider]  simvastatin (ZOCOR) 20 MG tablet Take 20 mg by mouth at bedtime.   Yes [provider]  traZODone (DESYREL) 50 MG tablet Take 50 mg by mouth at bedtime.   Yes [provider]  Allergies  Allergen Reactions  . Lisinopril     Unknown    FAMILY HISTORY:  +HTN  SOCIAL HISTORY:  reports that he has never smoked. He does not have any smokeless tobacco history on file. He reports that he does not drink alcohol.  REVIEW OF SYSTEMS:   Can not obtain due to lethargy ALL OTHER ROS ARE NEGATIVE    VITAL SIGNS: Temp:  [99.3 F (37.4 C)-100.1 F (37.8 C)] 99.3 F (37.4 C) (09/07 1532) Pulse Rate:  [108-123] 108 (09/07 1500) Resp:  [18-31] 19 (09/07 1532) BP: (75-101)/(32-67) 101/67 (09/07 1532) SpO2:  [93 %-100 %] 100 % (09/07 1500) Weight:  [250 lb (113.4 kg)] 250 lb (113.4 kg) (09/07  1423)  Physical Examination:  GENERAL:critically ill appearing,  HEAD: Normocephalic, atraumatic.  EYES: Pupils equal, round, reactive to light.  No scleral icterus.  MOUTH: Moist mucosal membrane. NECK: Supple. No thyromegaly. No nodules. No JVD.  PULMONARY: +rhonchi,  CARDIOVASCULAR: S1 and S2. Regular rate and rhythm. No murmurs, rubs, or gallops.  GASTROINTESTINAL: Soft, nontender, -distended. No masses. Positive bowel sounds. No hepatosplenomegaly.  MUSCULOSKELETAL: No swelling, clubbing, or edema.  NEUROLOGIC: olethargic but arousable SKIN:intact,warm,dry        Recent Labs Lab 01/08/17 1353  NA 135  K 5.1  CL 104  CO2 16*  BUN 26*  CREATININE 2.24*  GLUCOSE 213*    Recent Labs Lab 01/08/17 1353  HGB 11.6*  HCT 36.0*  WBC 11.1*  PLT 161   Dg Chest Port 1 View  Result Date: 01/08/2017 CLINICAL DATA:  Weakness EXAM: PORTABLE CHEST 1 VIEW COMPARISON:  None. FINDINGS: Hypoventilation with mild bibasilar atelectasis. Negative for heart failure or effusion IMPRESSION: Hypoventilation with mild bibasilar atelectasis. Electronically Signed   By: Franchot Gallo M.D.   On: 01/08/2017 14:55  I have Independently reviewed images of  CXR   on 01/08/2017 Interpretation:no obvious opacities, no infiltrates   ASSESSMENT / PLAN: 56 yo AAM with acute septic shock with lactic acidosis with abd pain and diarrhea with acute renal failure with encephalopathy  1.aggressive IVF's 2.Neo if needed for MAP >65 3.follow UOP 4.admit to step down status 5.follow up chem 7 and cbc 6.empiric abx 7.CT abd with oral contract NOT IV contrast  Case discussed with Dr Benjie Karvonen Prognosis is guarded   Corrin Parker, M.D.  [facility]  Medical Director [place] Director [facility] Cardio-Pulmonary Department

## 2017-01-08 NOTE — ED Notes (Signed)
Clara- [phone] [facility])

## 2017-01-08 NOTE — ED Provider Notes (Signed)
-----------------------------------------   5:39 PM on 01/08/2017 -----------------------------------------  Patient remained hypotensive despite multiple/significant fluid boluses., Unable to reach intensive care physician at this time. I placed a right IJ triple lumen, no complications. We'll obtain an x-ray to eval placement. Patient tolerated very well. Patient will be admitted to the ICU very shortly.  CENTRAL LINE Performed by: Harvest Dark Consent: The procedure was performed in an emergent situation. Required items: required blood products, implants, devices, and special equipment available Patient identity confirmed: arm band and provided demographic data Time out: Immediately prior to procedure a "time out" was called to verify the correct patient, procedure, equipment, support staff and site/side marked as required. Indications: vascular access Anesthesia: local infiltration Local anesthetic: lidocaine 1% with epinephrine Anesthetic total: 3 ml Patient sedated: no Preparation: skin prepped with 2% chlorhexidine Skin prep agent dried: skin prep agent completely dried prior to procedure Sterile barriers: all five maximum sterile barriers used - cap, mask, sterile gown, sterile gloves, and large sterile sheet Hand hygiene: hand hygiene performed prior to central venous catheter insertion  Location details: Right internal jugular  Catheter type: triple lumen Catheter size: 8 Fr Pre-procedure: landmarks identified Ultrasound guidance: yes Successful placement: yes Post-procedure: line sutured and dressing applied Assessment: blood return through all parts, free fluid flow, placement verified by x-ray and no pneumothorax on x-ray Patient tolerance: Patient tolerated the procedure well with no immediate complications.    Harvest Dark, MD 01/08/17 1740

## 2017-01-09 LAB — BASIC METABOLIC PANEL
ANION GAP: 6 (ref 5–15)
ANION GAP: 4 — AB (ref 5–15)
Anion gap: 6 (ref 5–15)
BUN: 28 mg/dL — ABNORMAL HIGH (ref 6–20)
BUN: 28 mg/dL — AB (ref 6–20)
BUN: 27 mg/dL — ABNORMAL HIGH (ref 6–20)
CALCIUM: 6.2 mg/dL — AB (ref 8.9–10.3)
CALCIUM: 6.6 mg/dL — AB (ref 8.9–10.3)
CALCIUM: 6.7 mg/dL — AB (ref 8.9–10.3)
CHLORIDE: 115 mmol/L — AB (ref 101–111)
CO2: 17 mmol/L — ABNORMAL LOW (ref 22–32)
CO2: 18 mmol/L — AB (ref 22–32)
CO2: 17 mmol/L — ABNORMAL LOW (ref 22–32)
CREATININE: 1.82 mg/dL — AB (ref 0.61–1.24)
CREATININE: 1.77 mg/dL — AB (ref 0.61–1.24)
Chloride: 116 mmol/L — ABNORMAL HIGH (ref 101–111)
Chloride: 115 mmol/L — ABNORMAL HIGH (ref 101–111)
Creatinine, Ser: 1.62 mg/dL — ABNORMAL HIGH (ref 0.61–1.24)
GFR calc Af Amer: 47 mL/min — ABNORMAL LOW (ref >60)
GFR calc Af Amer: 52 mL/min — ABNORMAL LOW (ref >60)
GFR calc non Af Amer: 39 mL/min — ABNORMAL LOW (ref >60)
GFR calc non Af Amer: 41 mL/min — ABNORMAL LOW (ref >60)
GFR calc non Af Amer: 45 mL/min — ABNORMAL LOW (ref >60)
GFR, EST AFRICAN AMERICAN: 46 mL/min — AB (ref >60)
GLUCOSE: 181 mg/dL — AB (ref 65–99)
Glucose, Bld: 150 mg/dL — ABNORMAL HIGH (ref 65–99)
Glucose, Bld: 160 mg/dL — ABNORMAL HIGH (ref 65–99)
POTASSIUM: 4.1 mmol/L (ref 3.5–5.1)
Potassium: 5.3 mmol/L — ABNORMAL HIGH (ref 3.5–5.1)
Potassium: 5.4 mmol/L — ABNORMAL HIGH (ref 3.5–5.1)
SODIUM: 138 mmol/L (ref 135–145)
Sodium: 140 mmol/L (ref 135–145)
Sodium: 136 mmol/L (ref 135–145)

## 2017-01-09 LAB — GLUCOSE, CAPILLARY
Glucose-Capillary: 119 mg/dL — ABNORMAL HIGH (ref 65–99)
Glucose-Capillary: 131 mg/dL — ABNORMAL HIGH (ref 65–99)
Glucose-Capillary: 129 mg/dL — ABNORMAL HIGH (ref 65–99)
Glucose-Capillary: 134 mg/dL — ABNORMAL HIGH (ref 65–99)
Glucose-Capillary: 133 mg/dL — ABNORMAL HIGH (ref 65–99)

## 2017-01-09 LAB — CBC
HCT: 28.1 % — ABNORMAL LOW (ref 40.0–52.0)
HCT: 29.1 % — ABNORMAL LOW (ref 40.0–52.0)
Hemoglobin: 9.2 g/dL — ABNORMAL LOW (ref 13.0–18.0)
Hemoglobin: 9.3 g/dL — ABNORMAL LOW (ref 13.0–18.0)
MCH: 30.6 pg (ref 26.0–34.0)
MCH: 30.5 pg (ref 26.0–34.0)
MCHC: 32.5 g/dL (ref 32.0–36.0)
MCHC: 32.1 g/dL (ref 32.0–36.0)
MCV: 93.9 fL (ref 80.0–100.0)
MCV: 95.2 fL (ref 80.0–100.0)
PLATELETS: 88 10*3/uL — AB (ref 150–440)
PLATELETS: 84 10*3/uL — AB (ref 150–440)
RBC: 3 MIL/uL — AB (ref 4.40–5.90)
RBC: 3.06 MIL/uL — ABNORMAL LOW (ref 4.40–5.90)
RDW: 15.7 % — ABNORMAL HIGH (ref 11.5–14.5)
RDW: 15.6 % — AB (ref 11.5–14.5)
WBC: 2.7 10*3/uL — AB (ref 3.8–10.6)
WBC: 5.1 10*3/uL (ref 3.8–10.6)

## 2017-01-09 LAB — URINE DRUG SCREEN, QUALITATIVE (ARMC ONLY)
Amphetamines, Ur Screen: NOT DETECTED
BARBITURATES, UR SCREEN: NOT DETECTED
Benzodiazepine, Ur Scrn: NOT DETECTED
CANNABINOID 50 NG, UR ~~LOC~~: NOT DETECTED
COCAINE METABOLITE, UR ~~LOC~~: NOT DETECTED
MDMA (Ecstasy)Ur Screen: NOT DETECTED
Methadone Scn, Ur: NOT DETECTED
Opiate, Ur Screen: NOT DETECTED
Phencyclidine (PCP) Ur S: NOT DETECTED
TRICYCLIC, UR SCREEN: NOT DETECTED

## 2017-01-09 LAB — CORTISOL: CORTISOL PLASMA: 25.5 ug/dL

## 2017-01-09 LAB — MAGNESIUM: MAGNESIUM: 1.6 mg/dL — AB (ref 1.7–2.4)

## 2017-01-09 LAB — RAPID HIV SCREEN (HIV 1/2 AB+AG)
HIV 1/2 Antibodies: NONREACTIVE
HIV-1 P24 Antigen - HIV24: NONREACTIVE

## 2017-01-09 LAB — LACTIC ACID, PLASMA
LACTIC ACID, VENOUS: 2.2 mmol/L — AB (ref 0.5–1.9)
Lactic Acid, Venous: 1.9 mmol/L (ref 0.5–1.9)

## 2017-01-09 LAB — SEDIMENTATION RATE: SED RATE: 48 mm/h — AB (ref 0–20)

## 2017-01-09 LAB — AMMONIA: AMMONIA: 28 umol/L (ref 9–35)

## 2017-01-09 LAB — LACTOFERRIN, FECAL, QUALITATIVE: LACTOFERRIN, FECAL, QUAL: POSITIVE — AB

## 2017-01-09 LAB — TSH: TSH: 1.774 u[IU]/mL (ref 0.350–4.500)

## 2017-01-09 LAB — PHOSPHORUS: Phosphorus: 3.2 mg/dL (ref 2.5–4.6)

## 2017-01-09 MED ORDER — LOPERAMIDE HCL 2 MG PO CAPS
4.0000 mg | ORAL_CAPSULE | Freq: Four times a day (QID) | ORAL | Status: DC | PRN
  Administered 2017-01-09: 4 mg via ORAL
  Filled 2017-01-09 (×2): qty 2

## 2017-01-09 MED ORDER — ACETAMINOPHEN 325 MG PO TABS
650.0000 mg | ORAL_TABLET | Freq: Once | ORAL | Status: AC
  Administered 2017-01-09: 650 mg via ORAL
  Filled 2017-01-09: qty 2

## 2017-01-09 MED ORDER — HYDROCORTISONE NA SUCCINATE PF 100 MG IJ SOLR
50.0000 mg | Freq: Four times a day (QID) | INTRAMUSCULAR | Status: DC
  Administered 2017-01-09 – 2017-01-11 (×7): 50 mg via INTRAVENOUS
  Filled 2017-01-09: qty 2
  Filled 2017-01-09 (×2): qty 1
  Filled 2017-01-09 (×3): qty 2
  Filled 2017-01-09 (×3): qty 1

## 2017-01-09 MED ORDER — SODIUM CHLORIDE 0.9 % IV BOLUS (SEPSIS)
1000.0000 mL | Freq: Once | INTRAVENOUS | Status: AC
  Administered 2017-01-09: 1000 mL via INTRAVENOUS

## 2017-01-09 MED ORDER — RIFAXIMIN 200 MG PO TABS
200.0000 mg | ORAL_TABLET | Freq: Three times a day (TID) | ORAL | Status: AC
  Administered 2017-01-09 – 2017-01-12 (×8): 200 mg via ORAL
  Filled 2017-01-09 (×10): qty 1

## 2017-01-09 MED ORDER — SODIUM CHLORIDE 0.9 % IV BOLUS (SEPSIS)
2000.0000 mL | Freq: Once | INTRAVENOUS | Status: AC
  Administered 2017-01-09: 2000 mL via INTRAVENOUS

## 2017-01-09 MED ORDER — SODIUM CHLORIDE 0.9 % IV BOLUS (SEPSIS)
1000.0000 mL | INTRAVENOUS | Status: AC
  Administered 2017-01-09: 1000 mL via INTRAVENOUS

## 2017-01-09 MED ORDER — AZITHROMYCIN 500 MG PO TABS
1000.0000 mg | ORAL_TABLET | Freq: Once | ORAL | Status: AC
  Administered 2017-01-09: 1000 mg via ORAL
  Filled 2017-01-09: qty 2

## 2017-01-09 NOTE — Clinical Social Work Note (Signed)
CSW received consult that patient is from a group home. CSW will assess when able.  Santiago Bumpers, MSW, Latanya Presser [phone]

## 2017-01-09 NOTE — Clinical Social Work Note (Signed)
Clinical Social Work Assessment  Patient Details  Name: Jason Reed MRN: [id number] Date of Birth: [birth year]  Date of referral:  01/09/17               Reason for consult:  Facility Placement                Permission sought to share information with:  Facility Art therapist granted to share information::     Name::        Agency::     Relationship::     Contact Information:     Housing/Transportation Living arrangements for the past 2 months:  Group Home Source of Information:  Facility, Medical Team Patient Interpreter Needed:  None Criminal Activity/Legal Involvement Pertinent to Current Situation/Hospitalization:  No - Comment as needed Significant Relationships:  Jason Reed Lives with:  Facility Resident Do you feel safe going back to the place where you live?  Yes Need for family participation in patient care:  Yes (Comment) (Patient has AMS)  Care giving concerns:  The patient admitted from a group home.   Social Worker assessment / plan:  CSW received a call from the attending RN that an unknown person was attempting to gain information about the patient without a password. The CSW contacted the patient's caregiver, Jason Reed, with [employer] Jason Reed confirmed that she had contacted the RN. The CSW explained the hospital policies/federal policies regarding HIPPA and PHI. Jason Reed verbalized understanding.  Jason Reed reported that the patient can return when stable. The patient was found having wandered from the home by Jason Reed in a field sitting in a chair and covered in feces. Jason Reed reported that this is not his baseline behavior in any way. Currently, the patient is under code Sepsis, and his discharge needs (SNF v home) are unknown as PT is pending. The patient is febrile at this time.  CSW will continue to follow for discharge planning and to screen for possible neglect due to the way the patient was found.     Employment status:   Retired Forensic scientist:  [employer], Medicaid In [place] PT Recommendations:  Not assessed at this time Information / Referral to community resources:     Patient/Family's Response to care:  The patient is lethargic and only oriented to person and place. The patient's caregiver, Jason Reed, thanked the CSW for assistance.  Patient/Family's Understanding of and Emotional Response to Diagnosis, Current Treatment, and Prognosis:  The patient seems to understand that he is in [facility]. The patient's caregiver, Jason Reed, understands that the patient is in critical care.  Emotional Assessment Appearance:  Appears stated age Attitude/Demeanor/Rapport:  Lethargic Affect (typically observed):  Quiet Orientation:  Oriented to Self Alcohol / Substance use:  Never Used Psych involvement (Current and /or in the community):  No (Comment)  Discharge Needs  Concerns to be addressed:  Care Coordination, Discharge Planning Concerns Readmission within the last 30 days:  No Current discharge risk:  Chronically ill, Lack of support system, Other (Concern for neglect due to how the patient was found) Barriers to Discharge:  Continued Medical Work up   [employer], LCSW 01/09/2017, 10:48 AM

## 2017-01-09 NOTE — Consult Note (Signed)
   Name: Jason Reed MRN: [id number] DOB: [birth year]     CONSULTATION DATE: 01/08/2017  REFERRING MD : Benjie Karvonen  CHIEF COMPLAINT:  Diarrhea, pain  HISTORY OF PRESENT ILLNESS:  Low BP, IVF's Still with profuse diarrhea Creat better LA improved    Allergies  Allergen Reactions  . Lisinopril     Unknown    REVIEW OF SYSTEMS:   Can not obtain due to lethargy ALL OTHER ROS ARE NEGATIVE    VITAL SIGNS: Temp:  [99.3 F (37.4 C)-103.1 F (39.5 C)] 101.7 F (38.7 C) (09/08 1000) Pulse Rate:  [102-232] 138 (09/08 1000) Resp:  [0-31] 29 (09/08 1000) BP: (60-203)/(28-160) 117/67 (09/08 1000) SpO2:  [92 %-100 %] 98 % (09/08 1000) Weight:  [250 lb (113.4 kg)-259 lb 0.7 oz (117.5 kg)] 259 lb 0.7 oz (117.5 kg) (09/08 0431)   Physical Examination:  GENERAL:critically ill appearing,  HEAD: Normocephalic, atraumatic.  EYES: Pupils equal, round, reactive to light.  No scleral icterus.  MOUTH: Moist mucosal membrane. NECK: Supple. No thyromegaly. No nodules. No JVD.  PULMONARY: +rhonchi,  CARDIOVASCULAR: S1 and S2. Regular rate and rhythm. No murmurs, rubs, or gallops.  GASTROINTESTINAL: Soft, nontender, -distended. No masses. Positive bowel sounds. No hepatosplenomegaly.  MUSCULOSKELETAL: No swelling, clubbing, or edema.  NEUROLOGIC: olethargic but arousable SKIN:intact,warm,dry       Recent Labs Lab 01/08/17 1353 01/08/17 2308 01/09/17 0648  NA 135 138 138  K 5.1 5.4* 4.1  CL 104 111 115*  CO2 16* 20* 17*  BUN 26* 32* 28*  CREATININE 2.24* 2.10* 1.82*  GLUCOSE 213* 167* 150*    Recent Labs Lab 01/08/17 1353 01/08/17 2308 01/09/17 0648  HGB 11.6* 10.4* 9.2*  HCT 36.0* 32.0* 28.1*  WBC 11.1* 3.4* 2.7*  PLT 161 116* 88*      ASSESSMENT / PLAN: 56 yo AAM with acute septic shock with lactic acidosis with abd pain and diarrhea with acute renal failure with encephalopathy  1.aggressive IVF's 2.Neo if needed for MAP >65 3.follow UOP 4.step down  status 5.follow up chem 7 and cbc 6.empiric abx 7.CT abd shows probable  Infectious colitis 8.gi consult 9.follow up cultures, check HIV  Remains step down status    Corrin Parker, M.D.  [facility]  Medical Director [place] Director [facility] Cardio-Pulmonary Department

## 2017-01-09 NOTE — Progress Notes (Signed)
Dr.Kasa notified of pt's Lactic result of 2.2 and Pt calcium of 6.2. Dr. Mortimer Fries acknowledged and gave verbal orders to start an additional 1 Liter bolus.  Orders read back and acknowledged. Will continue to assess.

## 2017-01-09 NOTE — Plan of Care (Signed)
Problem: Education: Goal: Knowledge of [facility] Education information/materials will improve Outcome: Not Met (add Reason) Pt was febrile overnight and remained tachycardiac with rates from low 100s-130s. Pt was given 7L boluses of NS overnight due to low BPs and CVPs. Pt remained on 2L overnight with adequate O2 sats.

## 2017-01-09 NOTE — Progress Notes (Addendum)
Dr.Kasa notified of pt's HR remaining ST in the 20's, BP dropping currently  99/45 (65). Verbal orders to draw a Lactic and give 2 L bolus. Orders read back and acknowledged. Will continue to assess.

## 2017-01-09 NOTE — Consult Note (Signed)
Jason Darby, MD [address]  [place]  [place], [place] [address]  Main: [phone]  Fax: [phone] Pager: [phone]   Consultation  Referring Provider:     No ref. provider found Primary Care Physician:  Patient, No Pcp Per Primary Gastroenterologist: None         Reason for Consultation:     Acute diarrhea, hypovolemic shock  Date of Admission:  01/08/2017 Date of Consultation:  01/09/2017         HPI:   Jason Reed is a 56 y.o. male with history of hypertension, diabetes was admitted to the ICU yesterday secondary to hypovolemic shock from severe profuse diarrhea. He was brought by EMS yesterday to the ER solids in feces, he was hypotensive, tachycardic,was found to be inacute renal failure and elevated lactate. Patient is not a good historian as he is lethargic at the time of interview and was not answering all of my questions. He has mild MR. He reports having several bouts of nonbloody diarrhea since Monday. Patient reports that he started having fever, diffuse abdominal pain, nausea and vomiting followed by diarrhea. He goes to school with people of his age. He denies eating out, recent travel, sick contacts. He was initially admitted to the floor, transferred to ICU as he developed hypovolemic shock. He has been febrile in ICU to temp of 101. He had a CT without contrast which showed mild rectosigmoid thickening and gaseous distention. He denies taking any inhalants or IV drugs.he is also on several different anti-psychiatric medications as outpatient.  Since admission to the ICU he received aggressive fluid resuscitation, AKI is improving. He continues to have profuse watery diarrhea, per patient's nurse diarrhea has slightly improved today. He is more arousable today. He did not have any episodes of vomiting. He was started empirically on Cipro and Flagyl. Workup thus far was negative for infectious diarrhea including C. Difficile.HIV was negative He did not  have any GI surgeries. GI Procedures: none  Past Medical History:  Diagnosis Date  . Diabetes ([place])   . HTN (hypertension)     History reviewed. No pertinent surgical history.  Prior to Admission medications   Medication Sig Start Date End Date Taking? Authorizing Provider  acetaminophen (TYLENOL) 325 MG tablet Take 650 mg by mouth every 6 (six) hours as needed.   Yes [provider]  ARIPiprazole (ABILIFY) 15 MG tablet Take 15 mg by mouth daily.   Yes [provider]  aspirin EC 81 MG tablet Take 81 mg by mouth daily.   Yes [provider]  chlorhexidine (PERIDEX) 0.12 % solution Use as directed 15 mLs in the mouth or throat 2 (two) times daily.   Yes [provider]  clonazePAM (KLONOPIN) 1 MG tablet Take 1 mg by mouth 3 (three) times daily as needed for anxiety. Take 1 mg by mouth every 12 hours as needed and take 1 mg by mouth at bedtime.   Yes [provider]  gabapentin (NEURONTIN) 300 MG capsule Take 300 mg by mouth 3 (three) times daily.   Yes [provider]  guaiFENesin-dextromethorphan (ROBITUSSIN DM) 100-10 MG/5ML syrup Take 10 mLs by mouth every 4 (four) hours as needed for cough.   Yes [provider]  hydrochlorothiazide (HYDRODIURIL) 25 MG tablet Take 25 mg by mouth daily.   Yes [provider]  hydrocortisone cream 1 % Apply 1 application topically 2 (two) times daily.   Yes [provider]  loratadine (CLARITIN) 10  MG tablet Take 10 mg by mouth daily.   Yes [provider]  losartan (COZAAR) 100 MG tablet Take 100 mg by mouth daily.   Yes [provider]  metFORMIN (GLUCOPHAGE) 500 MG tablet Take 500 mg by mouth 2 (two) times daily with a meal.   Yes [provider]  metoprolol succinate (TOPROL-XL) 25 MG 24 hr tablet Take 25 mg by mouth daily.   Yes [provider]  mirtazapine (REMERON SOL-TAB) 15 MG disintegrating tablet Take 15 mg by mouth at bedtime.    Yes [provider]  ondansetron (ZOFRAN-ODT) 4 MG disintegrating tablet Take 4 mg by mouth every 6 (six) hours as needed for nausea or vomiting.   Yes [provider]  phenytoin (DILANTIN) 100 MG ER capsule Take 100-300 mg by mouth 2 (two) times daily. Take 100 mg by mouth in the morning and 300 mg by mouth at bedtime.   Yes [provider]  psyllium (METAMUCIL) 58.6 % powder Take 1 packet by mouth daily.   Yes [provider]  QUEtiapine (SEROQUEL) 400 MG tablet Take 800 mg by mouth at bedtime.   Yes [provider]  simvastatin (ZOCOR) 20 MG tablet Take 20 mg by mouth at bedtime.   Yes [provider]  traZODone (DESYREL) 50 MG tablet Take 50 mg by mouth at bedtime.   Yes [provider]    No family history on file.   Social History  Substance Use Topics  . Smoking status: Never Smoker  . Smokeless tobacco: Not on file  . Alcohol use No    Allergies as of 01/08/2017 - Review Complete 01/08/2017  Allergen Reaction Noted  . Lisinopril  01/08/2017    Review of Systems:    All systems reviewed and negative except where noted in HPI.   Physical Exam:  Vital signs in last 24 hours: Temp:  [99.3 F (37.4 C)-103.1 F (39.5 C)] 100.2 F (37.9 C) (09/08 1400) Pulse Rate:  [102-232] 120 (09/08 1400) Resp:  [0-29] 27 (09/08 1400) BP: (60-203)/(28-160) 95/49 (09/08 1400) SpO2:  [92 %-100 %] 99 % (09/08 1400) Weight:  [254 lb 13.6 oz (115.6 kg)-259 lb 0.7 oz (117.5 kg)] 259 lb 0.7 oz (117.5 kg) (09/08 0431) Last BM Date: 01/09/17   General:  Lethargic, opens eyes to commands, cooperative in NAD Head:  Normocephalic and atraumatic. Eyes:   No icterus.   Conjunctiva pink. PERRLA. Neck:  Supple; no masses or thyroidomegaly Lungs: Respirations even and unlabored. Lungs clear to auscultation bilaterally.   No wheezes, crackles, or rhonchi.  Heart:  Regular rate and rhythm;  Without murmur, clicks, rubs or gallops Abdomen:   Soft, nondistended, mild diffusetenderness. Normal bowel sounds. No appreciable masses or hepatomegaly.  No rebound or guarding.  Rectal:  Not performed.liquid brown stool in rectal bag Msk:  Symmetrical without gross deformities.   Extremities:  Without edema, cyanosis or clubbing. Neurologic:  Alert and oriented x2;   Skin:  Intact without significant lesions or rashes.   LAB RESULTS:  Recent Labs  01/08/17 1353 01/08/17 2308 01/09/17 0648  WBC 11.1* 3.4* 2.7*  HGB 11.6* 10.4* 9.2*  HCT 36.0* 32.0* 28.1*  PLT 161 116* 88*   BMET  Recent Labs  01/08/17 1353 01/08/17 2308 01/09/17 0648  NA 135 138 138  K 5.1 5.4* 4.1  CL 104 111 115*  CO2 16* 20* 17*  GLUCOSE 213* 167* 150*  BUN 26* 32* 28*  CREATININE 2.24* 2.10* 1.82*  CALCIUM  7.6* 6.8* 6.2*   LFT  Recent Labs  01/08/17 2308  PROT 5.3*  ALBUMIN 2.6*  AST 42*  ALT 25  ALKPHOS 76  BILITOT 1.1   PT/INR  Recent Labs  01/08/17 1353  LABPROT 14.2  INR 1.11    STUDIES: Ct Abdomen Pelvis Wo Contrast  Result Date: 01/08/2017 CLINICAL DATA:  Diarrhea.  Hypotension and tachycardia.  Confusion. EXAM: CT ABDOMEN AND PELVIS WITHOUT CONTRAST TECHNIQUE: Multidetector CT imaging of the abdomen and pelvis was performed following the standard protocol without IV contrast. COMPARISON:  None. FINDINGS: Lower chest: Respiratory motion artifact with mild dependent opacity in both lung bases, likely atelectasis. No pleural effusion. Coronary artery atherosclerosis. Normal heart size. Hepatobiliary: No focal liver abnormality is seen. No gallstones, gallbladder wall thickening, or biliary dilatation. Pancreas: Unremarkable. Spleen: Unremarkable. Adrenals/Urinary Tract: Unremarkable adrenal glands. Absent right kidney. Mild left hydronephrosis. Mild-to-moderate diffuse left ureteral dilatation without obstructing stone identified. Decompressed bladder with Foley catheter in place. Stomach/Bowel: The stomach is within normal limits.  A rectal tube is in place. A small amount of liquid stool is present at the rectosigmoid region. There is moderate gaseous distension of the sigmoid colon, which is redundant. The sigmoid colon also has a somewhat featureless appearance though there is only at most mild wall thickening. A small amount of formed stool is present in the hepatic and splenic flexures, and there is a small amount of fluid in the descending colon. There is mild gaseous distension of the transverse colon. There is no small bowel dilatation. Vascular/Lymphatic: Normal caliber of the abdominal aorta. No enlarged lymph nodes. Reproductive: Unremarkable prostate. Other: No intraperitoneal free fluid. Small fat containing umbilical hernia. Musculoskeletal: Thoracolumbar spondylosis. Degenerative endplate irregularity and disc space narrowing at L4-5 and L5-S1 with neural foraminal stenosis at both levels. Grade 1 retrolisthesis of L5 on S1. IMPRESSION: 1. Moderate gaseous distension of the colon suggestive of ileus. Mild rectosigmoid wall thickening may indicate infectious colitis. 2. Mild-to-moderate left hydroureteronephrosis. No obstructing stone identified. Foley catheter in place. 3. Absent right kidney. Electronically Signed   By: Logan Bores M.D.   On: 01/08/2017 18:51   Dg Chest Portable 1 View  Result Date: 01/08/2017 CLINICAL DATA:  Central line placement EXAM: PORTABLE CHEST 1 VIEW COMPARISON:  01/08/2017 FINDINGS: Right central venous catheter tip overlies the right upper chest, presumably over venous confluence, allowing for patient rotation. No pneumothorax. Low lung volumes. Borderline cardiomegaly. IMPRESSION: 1. Right sided central venous catheter tip probably superimposes the venous confluence allowing for rotated patient. No pneumothorax. Electronically Signed   By: Donavan Foil M.D.   On: 01/08/2017 17:58   Dg Chest Port 1 View  Result Date: 01/08/2017 CLINICAL DATA:  Weakness EXAM: PORTABLE CHEST 1 VIEW COMPARISON:   None. FINDINGS: Hypoventilation with mild bibasilar atelectasis. Negative for heart failure or effusion IMPRESSION: Hypoventilation with mild bibasilar atelectasis. Electronically Signed   By: Franchot Gallo M.D.   On: 01/08/2017 14:55      Impression / Plan:   Shay Bartoli is a 56 y.o. y/o male with MR, diabetes, hypertension with acute febrile nonbloody diarrhea leading to severe hypovolemia and and AKI. Most likely infectious diarrhea. However, GI pathogen panel for various bacterial and vital infections and C. Difficile have been negative. HIV negative. He also developed mild pancytopenia most likely secondary to aggressive fluid resuscitation  - Check ESR, CRP - Check urine toxicology - Check TSH, serum cortisol - Check stool lactoferrin/WBC - Empiric antibiotics: ciprofloxacin plus azithromycin plus rifaximin -  Start Imodium 4 mg, 4 times every 6 hours, hold for BM less than 3 per day - Monitor electrolytes, magnesium, phosphorus and CBC closely   Thank you for involving me in the care of this patient. I will continue to follow    LOS: 1 day   Sherri Sear, MD  01/09/2017, 3:26 PM   Note: This dictation was prepared with Dragon dictation along with smaller phrase technology. Any transcriptional errors that result from this process are unintentional.

## 2017-01-09 NOTE — Clinical Social Work Note (Signed)
CSW contacted the patient's brother, Charlotte Crumb, to confirm that he is aware that the patient is in CCU. Mr. Choquette had been notified by the patient's caregiver, Clara, and he plans to arrive in the morning to visit the patient and connect with the medical team. Mr. Costales gave verbal permission for the team to discuss care with Clara, and he indicated that he feels his brother is well-cared for by her facility. CSW will continue to follow.  Santiago Bumpers, MSW, Latanya Presser [phone]

## 2017-01-09 NOTE — Progress Notes (Addendum)
Dr.Kasa notified of pts increasing HR, now ST at 137. Increasing RR, currently 25 breaths per minute, regular and unlabored. Pt temp also increasing, currently 101.7 core temp. New orders given for additional 1 time dose of Tylenol. Ice packs applied. Pt is lethargic but is responsive to voice. Pt remains alert to place and self. Will continue to assess.

## 2017-01-09 NOTE — Progress Notes (Signed)
PT Hold Note  Patient Details Name: Jason Reed MRN: [id number] DOB: 07/25/1960   Hold Evaluation:    Reason Eval/Treat Not Completed: Medical issues which prohibited therapy. Order received and chart reviewed. Pt was significantly hypotensive overnight requiring multiple fluid boluses to maintain BP and CVP. BP appears stable this AM however pt remains significantly tachycardic throughout AM and is currently febrile. Not appropriate for PT evaluation at this time. Will attempt PT evaluation on later date/time once pt has stabilized and is medically appropriate.   Lyndel Safe [name] PT, DPT   [name],Jason 01/09/2017, 8:06 AM

## 2017-01-09 NOTE — Progress Notes (Signed)
Facility contact person visited patient. Left contact information for Patients brother Tank [phone]). Pt also has sister, representative states that they do not contact the sister.  Representative also left personal cell phone number to contact her [phone].  Representative will bring over papers with patients current health history and medication list.

## 2017-01-09 NOTE — Progress Notes (Signed)
[facility] [place] NAME: Jason Reed    MR#:  [id number]  DATE OF BIRTH:  1960/05/12  SUBJECTIVE:  CHIEF COMPLAINT:  Patient is arousable but not answering any questions. No family members at bedside.  REVIEW OF SYSTEMS:  Review of systems unobtainable  DRUG ALLERGIES:   Allergies  Allergen Reactions  . Lisinopril     Unknown    VITALS:  Blood pressure (!) 95/49, pulse (!) 120, temperature 100.2 F (37.9 C), resp. rate (!) 27, height 6\' 2"  (1.88 m), weight 117.5 kg (259 lb 0.7 oz), SpO2 99 %.  PHYSICAL EXAMINATION:  GENERAL:  56 y.o.-year-old patient lying in the bed with no acute distress.  EYES: Pupils equal, round, reactive to light and accommodation. No scleral icterus.  HEENT: Head atraumatic, normocephalic. Oropharynx and nasopharynx clear.  NECK:  Supple, no jugular venous distention. No thyroid enlargement, no tenderness. Central line is present LUNGS: Moderate breath sounds bilaterally, no wheezing, rales,rhonchi or crepitation. No use of accessory muscles of respiration.  CARDIOVASCULAR: S1, S2 normal. No murmurs, rubs, or gallops.  ABDOMEN: Soft, nontender, nondistended. Bowel sounds present. No organomegaly or mass.  EXTREMITIES: No pedal edema, cyanosis, or clubbing.  NEUROLOGIC: Delirious PSYCHIATRIC: The patient is delirious  SKIN: No obvious rash, lesion, or ulcer.    LABORATORY PANEL:   CBC  Recent Labs Lab 01/09/17 0648  WBC 2.7*  HGB 9.2*  HCT 28.1*  PLT 88*   ------------------------------------------------------------------------------------------------------------------  Chemistries   Recent Labs Lab 01/08/17 2308 01/09/17 0648  NA 138 138  K 5.4* 4.1  CL 111 115*  CO2 20* 17*  GLUCOSE 167* 150*  BUN 32* 28*  CREATININE 2.10* 1.82*  CALCIUM 6.8* 6.2*  MG 1.9  --   AST 42*  --   ALT 25  --   ALKPHOS 76  --   BILITOT 1.1  --     ------------------------------------------------------------------------------------------------------------------  Cardiac Enzymes  Recent Labs Lab 01/08/17 1353  TROPONINI <0.03   ------------------------------------------------------------------------------------------------------------------  RADIOLOGY:  Ct Abdomen Pelvis Wo Contrast  Result Date: 01/08/2017 CLINICAL DATA:  Diarrhea.  Hypotension and tachycardia.  Confusion. EXAM: CT ABDOMEN AND PELVIS WITHOUT CONTRAST TECHNIQUE: Multidetector CT imaging of the abdomen and pelvis was performed following the standard protocol without IV contrast. COMPARISON:  None. FINDINGS: Lower chest: Respiratory motion artifact with mild dependent opacity in both lung bases, likely atelectasis. No pleural effusion. Coronary artery atherosclerosis. Normal heart size. Hepatobiliary: No focal liver abnormality is seen. No gallstones, gallbladder wall thickening, or biliary dilatation. Pancreas: Unremarkable. Spleen: Unremarkable. Adrenals/Urinary Tract: Unremarkable adrenal glands. Absent right kidney. Mild left hydronephrosis. Mild-to-moderate diffuse left ureteral dilatation without obstructing stone identified. Decompressed bladder with Foley catheter in place. Stomach/Bowel: The stomach is within normal limits. A rectal tube is in place. A small amount of liquid stool is present at the rectosigmoid region. There is moderate gaseous distension of the sigmoid colon, which is redundant. The sigmoid colon also has a somewhat featureless appearance though there is only at most mild wall thickening. A small amount of formed stool is present in the hepatic and splenic flexures, and there is a small amount of fluid in the descending colon. There is mild gaseous distension of the transverse colon. There is no small bowel dilatation. Vascular/Lymphatic: Normal caliber of the abdominal aorta. No enlarged lymph nodes. Reproductive: Unremarkable prostate. Other: No  intraperitoneal free fluid. Small fat containing umbilical hernia. Musculoskeletal: Thoracolumbar spondylosis. Degenerative endplate irregularity and disc space narrowing  at L4-5 and L5-S1 with neural foraminal stenosis at both levels. Grade 1 retrolisthesis of L5 on S1. IMPRESSION: 1. Moderate gaseous distension of the colon suggestive of ileus. Mild rectosigmoid wall thickening may indicate infectious colitis. 2. Mild-to-moderate left hydroureteronephrosis. No obstructing stone identified. Foley catheter in place. 3. Absent right kidney. Electronically Signed   By: Logan Bores M.D.   On: 01/08/2017 18:51   Dg Chest Portable 1 View  Result Date: 01/08/2017 CLINICAL DATA:  Central line placement EXAM: PORTABLE CHEST 1 VIEW COMPARISON:  01/08/2017 FINDINGS: Right central venous catheter tip overlies the right upper chest, presumably over venous confluence, allowing for patient rotation. No pneumothorax. Low lung volumes. Borderline cardiomegaly. IMPRESSION: 1. Right sided central venous catheter tip probably superimposes the venous confluence allowing for rotated patient. No pneumothorax. Electronically Signed   By: Donavan Foil M.D.   On: 01/08/2017 17:58   Dg Chest Port 1 View  Result Date: 01/08/2017 CLINICAL DATA:  Weakness EXAM: PORTABLE CHEST 1 VIEW COMPARISON:  None. FINDINGS: Hypoventilation with mild bibasilar atelectasis. Negative for heart failure or effusion IMPRESSION: Hypoventilation with mild bibasilar atelectasis. Electronically Signed   By: Franchot Gallo M.D.   On: 01/08/2017 14:55    EKG:   Orders placed or performed during the hospital encounter of 01/08/17  . EKG    ASSESSMENT AND PLAN:    #Acute encephalopathy secondary to septic shock Aggressive IV fluids Follow serial lactic acid levels Follow-up on the cultures On IV antibiotics, neuro checks  #Septic shock with abdominal pain and diarrhea-probably from acute infectious colitis Aggressive hydration with IV fluids so  far received 13 L of fluid boluses IV antibiotics ciprofloxacin and Flagyl Follow-up with gastroenterology CT abdomen has revealed acute infectious colitis and possible ileus Repeat a.m. Labs  #Acute renal failure from septic shock Hydrate with IV fluids and monitor renal function and avoid nephrotoxins   #Diabetes mellitus sliding scale insulin at this time   All the records are reviewed and case discussed with Care Management/Social Workerr. Management plans discussed with the patient, family and they are in agreement.  CODE STATUS: FC,Until CODE STATUS determined  TOTAL TIME TAKING CARE OF THIS PATIENT: 35 minutes.   POSSIBLE D/C IN ?DAYS, DEPENDING ON CLINICAL CONDITION.  Note: This dictation was prepared with Dragon dictation along with smaller phrase technology. Any transcriptional errors that result from this process are unintentional.   Nicholes Mango M.D on 01/09/2017 at 4:11 PM  Between 7am to 6pm - Pager - [phone] After 6pm go to [email or web address] - password [email or web address]  Office  [phone]  CC: Primary care physician; Patient, No Pcp Per

## 2017-01-10 DIAGNOSIS — A09 Infectious gastroenteritis and colitis, unspecified: Secondary | ICD-10-CM

## 2017-01-10 DIAGNOSIS — R571 Hypovolemic shock: Secondary | ICD-10-CM

## 2017-01-10 LAB — BASIC METABOLIC PANEL
ANION GAP: 8 (ref 5–15)
ANION GAP: 6 (ref 5–15)
ANION GAP: 5 (ref 5–15)
Anion gap: 5 (ref 5–15)
BUN: 27 mg/dL — AB (ref 6–20)
BUN: 24 mg/dL — ABNORMAL HIGH (ref 6–20)
BUN: 26 mg/dL — ABNORMAL HIGH (ref 6–20)
BUN: 23 mg/dL — ABNORMAL HIGH (ref 6–20)
CALCIUM: 6.3 mg/dL — AB (ref 8.9–10.3)
CALCIUM: 7.2 mg/dL — AB (ref 8.9–10.3)
CALCIUM: 7.6 mg/dL — AB (ref 8.9–10.3)
CALCIUM: 7.5 mg/dL — AB (ref 8.9–10.3)
CHLORIDE: 115 mmol/L — AB (ref 101–111)
CHLORIDE: 116 mmol/L — AB (ref 101–111)
CO2: 16 mmol/L — ABNORMAL LOW (ref 22–32)
CO2: 17 mmol/L — AB (ref 22–32)
CO2: 16 mmol/L — AB (ref 22–32)
CO2: 17 mmol/L — ABNORMAL LOW (ref 22–32)
Chloride: 117 mmol/L — ABNORMAL HIGH (ref 101–111)
Chloride: 116 mmol/L — ABNORMAL HIGH (ref 101–111)
Creatinine, Ser: 1.65 mg/dL — ABNORMAL HIGH (ref 0.61–1.24)
Creatinine, Ser: 1.44 mg/dL — ABNORMAL HIGH (ref 0.61–1.24)
Creatinine, Ser: 1.39 mg/dL — ABNORMAL HIGH (ref 0.61–1.24)
Creatinine, Ser: 1.4 mg/dL — ABNORMAL HIGH (ref 0.61–1.24)
GFR calc Af Amer: 51 mL/min — ABNORMAL LOW (ref >60)
GFR calc non Af Amer: 52 mL/min — ABNORMAL LOW (ref >60)
GFR calc non Af Amer: 54 mL/min — ABNORMAL LOW (ref >60)
GFR calc non Af Amer: 54 mL/min — ABNORMAL LOW (ref >60)
GFR, EST NON AFRICAN AMERICAN: 44 mL/min — AB (ref >60)
Glucose, Bld: 165 mg/dL — ABNORMAL HIGH (ref 65–99)
Glucose, Bld: 145 mg/dL — ABNORMAL HIGH (ref 65–99)
Glucose, Bld: 125 mg/dL — ABNORMAL HIGH (ref 65–99)
Glucose, Bld: 160 mg/dL — ABNORMAL HIGH (ref 65–99)
POTASSIUM: 5 mmol/L (ref 3.5–5.1)
Potassium: 4.6 mmol/L (ref 3.5–5.1)
Potassium: 4.3 mmol/L (ref 3.5–5.1)
Potassium: 3.9 mmol/L (ref 3.5–5.1)
SODIUM: 138 mmol/L (ref 135–145)
SODIUM: 140 mmol/L (ref 135–145)
SODIUM: 138 mmol/L (ref 135–145)
SODIUM: 138 mmol/L (ref 135–145)

## 2017-01-10 LAB — CBC
HCT: 24.3 % — ABNORMAL LOW (ref 40.0–52.0)
Hemoglobin: 7.8 g/dL — ABNORMAL LOW (ref 13.0–18.0)
MCH: 30.4 pg (ref 26.0–34.0)
MCHC: 32.1 g/dL (ref 32.0–36.0)
MCV: 94.6 fL (ref 80.0–100.0)
PLATELETS: 77 10*3/uL — AB (ref 150–440)
RBC: 2.57 MIL/uL — ABNORMAL LOW (ref 4.40–5.90)
RDW: 15.8 % — AB (ref 11.5–14.5)
WBC: 4 10*3/uL (ref 3.8–10.6)

## 2017-01-10 LAB — GLUCOSE, CAPILLARY
GLUCOSE-CAPILLARY: 140 mg/dL — AB (ref 65–99)
Glucose-Capillary: 117 mg/dL — ABNORMAL HIGH (ref 65–99)
Glucose-Capillary: 107 mg/dL — ABNORMAL HIGH (ref 65–99)
Glucose-Capillary: 178 mg/dL — ABNORMAL HIGH (ref 65–99)

## 2017-01-10 LAB — MAGNESIUM: Magnesium: 1.7 mg/dL (ref 1.7–2.4)

## 2017-01-10 LAB — URINE CULTURE

## 2017-01-10 LAB — T3, FREE: T3, Free: 1.1 pg/mL — ABNORMAL LOW (ref 2.0–4.4)

## 2017-01-10 LAB — PHOSPHORUS: Phosphorus: 3.2 mg/dL (ref 2.5–4.6)

## 2017-01-10 LAB — T4: T4, Total: 3.1 ug/dL — ABNORMAL LOW (ref 4.5–12.0)

## 2017-01-10 MED ORDER — SODIUM CHLORIDE 0.9 % IV BOLUS (SEPSIS)
2000.0000 mL | Freq: Once | INTRAVENOUS | Status: AC
  Administered 2017-01-10: 2000 mL via INTRAVENOUS

## 2017-01-10 MED ORDER — ORAL CARE MOUTH RINSE
15.0000 mL | Freq: Two times a day (BID) | OROMUCOSAL | Status: DC
  Administered 2017-01-10 – 2017-01-12 (×6): 15 mL via OROMUCOSAL

## 2017-01-10 MED ORDER — SODIUM CHLORIDE 0.9% FLUSH
10.0000 mL | INTRAVENOUS | Status: DC | PRN
  Administered 2017-01-11: 23:00:00 12 mL
  Filled 2017-01-10: qty 40

## 2017-01-10 MED ORDER — SODIUM CHLORIDE 0.9 % IV SOLN
1.0000 g | Freq: Once | INTRAVENOUS | Status: AC
  Administered 2017-01-10: 1 g via INTRAVENOUS
  Filled 2017-01-10: qty 10

## 2017-01-10 MED ORDER — SODIUM CHLORIDE 0.9 % IV BOLUS (SEPSIS)
1000.0000 mL | Freq: Once | INTRAVENOUS | Status: AC
  Administered 2017-01-10: 1000 mL via INTRAVENOUS

## 2017-01-10 MED ORDER — MAGNESIUM SULFATE IN D5W 1-5 GM/100ML-% IV SOLN
1.0000 g | Freq: Once | INTRAVENOUS | Status: AC
  Administered 2017-01-10: 1 g via INTRAVENOUS
  Filled 2017-01-10: qty 100

## 2017-01-10 MED ORDER — SODIUM CHLORIDE 0.9% FLUSH
10.0000 mL | Freq: Two times a day (BID) | INTRAVENOUS | Status: DC
  Administered 2017-01-10 – 2017-01-11 (×4): 10 mL
  Administered 2017-01-12: 20 mL

## 2017-01-10 NOTE — Progress Notes (Signed)
Jason Darby, MD [address]  Jason Reed  [place], [place] [address]  Main: [phone]  Fax: [phone] Pager: [phone]   Jason Reed is being followed for acute diarrhea Day 1 of follow up    Subjective: More alert and awake today, he sees trying to feel better He had 500 mL of liquid brown stool since 7 PM to 7 AM  He did not have any stool output since 7 AM today per his nurse he denies any abdominal pain, nausea, vomiting   Objective: Vital signs in last 24 hours: Vitals:   01/10/17 0900 01/10/17 1000 01/10/17 1100 01/10/17 1200  BP: 107/66 110/72 115/74 120/70  Pulse: (!) 104 (!) 105 (!) 105 (!) 111  Resp: 14 15 17 18   Temp: (!) 97.2 F (36.2 C) (!) 97.5 F (36.4 C) 97.9 F (36.6 C) 98.2 F (36.8 C)  TempSrc:      SpO2: 100% 100% 100% 98%  Weight:      Height:       Weight change: 32 lb 3 oz (14.6 kg)  Intake/Output Summary (Last 24 hours) at 01/10/17 1309 Last data filed at 01/10/17 1200  Gross per 24 hour  Intake         12049.33 ml  Output             4285 ml  Net          7764.33 ml     Exam: Heart:: Regular rate and rhythm or S1S2 present Lungs: clear to auscultation Abdomen: soft, nontender, normal bowel sounds   Lab Results: @LABTEST2 @ Micro Results: Recent Results (from the past 240 hour(s))  Blood Culture (routine x 2)     Status: None (Preliminary result)   Collection Time: 01/08/17  1:54 PM  Result Value Ref Range Status   Specimen Description BLOOD RIGHT HAND  Final   Special Requests   Final    BOTTLES DRAWN AEROBIC AND ANAEROBIC Blood Culture results may not be optimal due to an excessive volume of blood received in culture bottles   Culture NO GROWTH 2 DAYS  Final   Report Status PENDING  Incomplete  Blood Culture (routine x 2)     Status: None (Preliminary result)   Collection Time: 01/08/17  1:54 PM  Result Value Ref Range Status   Specimen Description BLOOD LEFT HAND  Final   Special Requests   Final   BOTTLES DRAWN AEROBIC AND ANAEROBIC Blood Culture results may not be optimal due to an excessive volume of blood received in culture bottles   Culture NO GROWTH 2 DAYS  Final   Report Status PENDING  Incomplete  Urine culture     Status: Abnormal   Collection Time: 01/08/17  2:21 PM  Result Value Ref Range Status   Specimen Description URINE, RANDOM  Final   Special Requests NONE  Final   Culture >=100,000 COLONIES/mL ESCHERICHIA COLI (A)  Final   Report Status 01/10/2017 FINAL  Final   Organism ID, Bacteria ESCHERICHIA COLI (A)  Final      Susceptibility   Escherichia coli - MIC*    AMPICILLIN 8 SENSITIVE Sensitive     CEFAZOLIN <=4 SENSITIVE Sensitive     CEFTRIAXONE <=1 SENSITIVE Sensitive     CIPROFLOXACIN <=0.25 SENSITIVE Sensitive     GENTAMICIN <=1 SENSITIVE Sensitive     IMIPENEM <=0.25 SENSITIVE Sensitive     NITROFURANTOIN <=16 SENSITIVE Sensitive     TRIMETH/SULFA <=20 SENSITIVE Sensitive     AMPICILLIN/SULBACTAM  4 SENSITIVE Sensitive     PIP/TAZO <=4 SENSITIVE Sensitive     Extended ESBL NEGATIVE Sensitive     * >=100,000 COLONIES/mL ESCHERICHIA COLI  Gastrointestinal Panel by PCR , Stool     Status: None   Collection Time: 01/08/17  2:22 PM  Result Value Ref Range Status   Campylobacter species NOT DETECTED NOT DETECTED Final   Plesimonas shigelloides NOT DETECTED NOT DETECTED Final   Salmonella species NOT DETECTED NOT DETECTED Final   Yersinia enterocolitica NOT DETECTED NOT DETECTED Final   Vibrio species NOT DETECTED NOT DETECTED Final   Vibrio cholerae NOT DETECTED NOT DETECTED Final   Enteroaggregative E coli (EAEC) NOT DETECTED NOT DETECTED Final   Enteropathogenic E coli (EPEC) NOT DETECTED NOT DETECTED Final   Enterotoxigenic E coli (ETEC) NOT DETECTED NOT DETECTED Final   Shiga like toxin producing E coli (STEC) NOT DETECTED NOT DETECTED Final   Shigella/Enteroinvasive E coli (EIEC) NOT DETECTED NOT DETECTED Final   Cryptosporidium NOT DETECTED NOT  DETECTED Final   Cyclospora cayetanensis NOT DETECTED NOT DETECTED Final   Entamoeba histolytica NOT DETECTED NOT DETECTED Final   Giardia lamblia NOT DETECTED NOT DETECTED Final   Adenovirus F40/41 NOT DETECTED NOT DETECTED Final   Astrovirus NOT DETECTED NOT DETECTED Final   Norovirus GI/GII NOT DETECTED NOT DETECTED Final   Rotavirus A NOT DETECTED NOT DETECTED Final   Sapovirus (I, II, IV, and V) NOT DETECTED NOT DETECTED Final  MRSA PCR Screening     Status: None   Collection Time: 01/08/17  6:57 PM  Result Value Ref Range Status   MRSA by PCR NEGATIVE NEGATIVE Final    Comment:        The GeneXpert MRSA Assay (FDA approved for NASAL specimens only), is one component of a comprehensive MRSA colonization surveillance program. It is not intended to diagnose MRSA infection nor to guide or monitor treatment for MRSA infections.   C difficile quick scan w PCR reflex     Status: None   Collection Time: 01/08/17  8:34 PM  Result Value Ref Range Status   C Diff antigen NEGATIVE NEGATIVE Final   C Diff toxin NEGATIVE NEGATIVE Final   C Diff interpretation No C. difficile detected.  Final   Studies/Results: Ct Abdomen Pelvis Wo Contrast  Result Date: 01/08/2017 CLINICAL DATA:  Diarrhea.  Hypotension and tachycardia.  Confusion. EXAM: CT ABDOMEN AND PELVIS WITHOUT CONTRAST TECHNIQUE: Multidetector CT imaging of the abdomen and pelvis was performed following the standard protocol without IV contrast. COMPARISON:  None. FINDINGS: Lower chest: Respiratory motion artifact with mild dependent opacity in both lung bases, likely atelectasis. No pleural effusion. Coronary artery atherosclerosis. Normal heart size. Hepatobiliary: No focal liver abnormality is seen. No gallstones, gallbladder wall thickening, or biliary dilatation. Pancreas: Unremarkable. Spleen: Unremarkable. Adrenals/Urinary Tract: Unremarkable adrenal glands. Absent right kidney. Mild left hydronephrosis. Mild-to-moderate diffuse  left ureteral dilatation without obstructing stone identified. Decompressed bladder with Foley catheter in place. Stomach/Bowel: The stomach is within normal limits. A rectal tube is in place. A small amount of liquid stool is present at the rectosigmoid region. There is moderate gaseous distension of the sigmoid colon, which is redundant. The sigmoid colon also has a somewhat featureless appearance though there is only at most mild wall thickening. A small amount of formed stool is present in the hepatic and splenic flexures, and there is a small amount of fluid in the descending colon. There is mild gaseous distension of the transverse colon. There  is no small bowel dilatation. Vascular/Lymphatic: Normal caliber of the abdominal aorta. No enlarged lymph nodes. Reproductive: Unremarkable prostate. Other: No intraperitoneal free fluid. Small fat containing umbilical hernia. Musculoskeletal: Thoracolumbar spondylosis. Degenerative endplate irregularity and disc space narrowing at L4-5 and L5-S1 with neural foraminal stenosis at both levels. Grade 1 retrolisthesis of L5 on S1. IMPRESSION: 1. Moderate gaseous distension of the colon suggestive of ileus. Mild rectosigmoid wall thickening may indicate infectious colitis. 2. Mild-to-moderate left hydroureteronephrosis. No obstructing stone identified. Foley catheter in place. 3. Absent right kidney. Electronically Signed   By: Logan Bores M.D.   On: 01/08/2017 18:51   Dg Chest Portable 1 View  Result Date: 01/08/2017 CLINICAL DATA:  Central line placement EXAM: PORTABLE CHEST 1 VIEW COMPARISON:  01/08/2017 FINDINGS: Right central venous catheter tip overlies the right upper chest, presumably over venous confluence, allowing for patient rotation. No pneumothorax. Low lung volumes. Borderline cardiomegaly. IMPRESSION: 1. Right sided central venous catheter tip probably superimposes the venous confluence allowing for rotated patient. No pneumothorax. Electronically  Signed   By: Donavan Foil M.D.   On: 01/08/2017 17:58   Dg Chest Port 1 View  Result Date: 01/08/2017 CLINICAL DATA:  Weakness EXAM: PORTABLE CHEST 1 VIEW COMPARISON:  None. FINDINGS: Hypoventilation with mild bibasilar atelectasis. Negative for heart failure or effusion IMPRESSION: Hypoventilation with mild bibasilar atelectasis. Electronically Signed   By: Franchot Gallo M.D.   On: 01/08/2017 14:55   Medications: I have reviewed the patient's current medications. Scheduled Meds: . ARIPiprazole  15 mg Oral Daily  . aspirin EC  81 mg Oral Daily  . enoxaparin (LOVENOX) injection  40 mg Subcutaneous Q24H  . gabapentin  300 mg Oral TID  . hydrocortisone sod succinate (SOLU-CORTEF) inj  50 mg Intravenous Q6H  . insulin aspart  0-9 Units Subcutaneous TID WC  . loratadine  10 mg Oral Daily  . mouth rinse  15 mL Mouth Rinse BID  . mirtazapine  15 mg Oral QHS  . phenytoin  100 mg Oral BID  . QUEtiapine  800 mg Oral QHS  . rifaximin  200 mg Oral Q8H  . simvastatin  20 mg Oral QHS  . sodium chloride flush  10-40 mL Intracatheter Q12H  . traZODone  50 mg Oral QHS   Continuous Infusions: . sodium chloride 150 mL/hr at 01/10/17 1200  . ciprofloxacin    . ciprofloxacin Stopped (01/10/17 5366)  . ibuprofen (CALDOLOR) IV Stopped (01/09/17 1300)   PRN Meds:.acetaminophen **OR** acetaminophen, clonazePAM, guaiFENesin-dextromethorphan, ibuprofen (CALDOLOR) IV, loperamide, ondansetron **OR** ondansetron (ZOFRAN) IV, ondansetron, senna-docusate, sodium chloride flush   Assessment: Active Problems:   Septic shock (HCC)  Keiji Melland is a 56 y.o. y/o male with MR, diabetes, hypertension with acute febrile nonbloody diarrhea leading to severe hypovolemia and and AKI. Most likely infectious diarrhea. However, GI pathogen panel for various bacterial and viral infections and C. Difficile have been negative. HIV negative. He also developed mild pancytopenia most likely secondary to aggressive fluid  resuscitation. Received Imodium, diarrhea is improving. Clinically getting better. Serum cortisol levels normal. TSH normal. Urine drug screen negative. Fecal lactoferrin positive. Elevated sedimentation rate. Creatinine is significantly improved. Afebrile in last 24 hours. He received azithromycin 1 g  Plan: Continue fluid resuscitation Monitor electrolytes and replete as necessary Start clear liquid diet Continue Imodium when necessary  Monitor CBC closely and transfuse as needed  Continue ciprofloxacin for 5 days total  Continue rifaximin for 3 days total   I'll continue to follow  LOS: 2 days   Jaquaveon Bilal 01/10/2017, 1:09 PM

## 2017-01-10 NOTE — Progress Notes (Signed)
MD aware of hgb level of 7.8. 1L bolus of NS ordered.

## 2017-01-10 NOTE — Progress Notes (Signed)
Pt. A&O x 2 at baseline, VSS O/N- CVP btwn 8-10 during shift- NP ordered a total of 4L NS bolus. Pt. Also received 1 g replacement of Ca chloride. Pt. Is still having diarrhea and remains on 2 L [place].  No care concerns at this time.

## 2017-01-10 NOTE — Consult Note (Signed)
   Name: Jason Reed MRN: [id number] DOB: Oct 13, 1960     CONSULTATION DATE: 01/08/2017  REFERRING MD : Benjie Karvonen  CHIEF COMPLAINT:  Diarrhea, pain  HISTORY OF PRESENT ILLNESS:  BP improved, IVF's given Diarrhea improved Creat better LA improved    Allergies  Allergen Reactions  . Lisinopril     Unknown    REVIEW OF SYSTEMS:   Alert and awake, no fevers, no chils  No pain  ALL OTHER ROS ARE NEGATIVE    VITAL SIGNS: Temp:  [96.4 F (35.8 C)-101.8 F (38.8 C)] 97.2 F (36.2 C) (09/09 0700) Pulse Rate:  [101-138] 107 (09/09 0700) Resp:  [13-29] 13 (09/09 0700) BP: (80-123)/(49-77) 110/67 (09/09 0700) SpO2:  [98 %-100 %] 100 % (09/09 0700) Weight:  [282 lb 3 oz (128 kg)] 282 lb 3 oz (128 kg) (09/09 0500)    Physical Examination:  GENERAL:NAD HEAD: Normocephalic, atraumatic.  EYES: Pupils equal, round, reactive to light.  No scleral icterus.  MOUTH: Moist mucosal membrane. NECK: Supple. No thyromegaly. No nodules. No JVD.  PULMONARY: +rhonchi,  CARDIOVASCULAR: S1 and S2. Regular rate and rhythm. No murmurs, rubs, or gallops.  GASTROINTESTINAL: Soft, nontender, -distended. No masses. Positive bowel sounds. No hepatosplenomegaly.  MUSCULOSKELETAL: No swelling, clubbing, or edema.  NEUROLOGIC: no focal deficits SKIN:intact,warm,dry      Recent Labs Lab 01/09/17 1623 01/09/17 1959 01/10/17 0216  NA 140 136 138  K 5.3* 5.4* 5.0  CL 116* 115* 117*  CO2 18* 17* 16*  BUN 28* 27* 27*  CREATININE 1.77* 1.62* 1.65*  GLUCOSE 160* 181* 165*    Recent Labs Lab 01/09/17 0648 01/09/17 1625 01/10/17 0216  HGB 9.2* 9.3* 7.8*  HCT 28.1* 29.1* 24.3*  WBC 2.7* 5.1 4.0  PLT 88* 84* 77*      ASSESSMENT / PLAN: 56 yo AAM with acute septic shock with lactic acidosis with abd pain and diarrhea with acute renal failure with encephalopathy-all resolving  1.IVF's 2.follow UOP 3.transfer to gen med floor 4.empiric abx 5.CT abd shows probable  Infectious  colitis 6.gi consult recs follow up   OK to transfer to gen med floor   Donyale Berthold Patricia Pesa, M.D.  [facility]  Medical Director [place] Director [facility] Cardio-Pulmonary Department

## 2017-01-10 NOTE — Evaluation (Signed)
Physical Therapy Evaluation Patient Details Name: Jason Reed MRN: [id number] DOB: 1960/09/20 Today's Date: 01/10/2017   History of Present Illness  Jason Reed  is a 56 y.o. male with a known history of diabetes and essential hypertension who comes in group home due to profuse diarrhea and altered mental status. Per CSW note the patient was found having wandered from the home by [place] in a field sitting in a chair and covered in feces Apparently, this morning patient woke up with profuse diarrhea. His blood pressure was reported to be 73/35 with heart rate 116 and he had some confusion. He was brought to the ER for further evaluation where he was noted to have persistent hypotension. He is mentating however he is very slow to answer any questions. Pt with history of MR. He has been given a total of 4-1/2 L of fluid however his systolic blood pressure remains in the 70s to 80s. He was admitted to the CCU but has transferred to the floor at [facility] time of PT evaluation.   Clinical Impression  Pt admitted with above diagnosis. Pt currently with functional limitations due to the deficits listed below (see PT Problem List). Pt moves slowly with bed mobility but does not require assist from therapist. He is also slow and unsteady with standing but no external assist provided. He ambulates slowly to RN station and back to bed using walker. Pt takes short shuffling steps and requires repeated cues to stay within confines of walker. MinA+1 provided to support balance and assist with turns. Pt reports DOE with ambulation and RR increases with pt reporting fatigue. HR increases to 130 bpm but SaO2>95% throughout entire ambulation. Pt with difficulty following cues during ambulation. At this time would recommend placement in SNF due to instability with ambulation. Will continue to update discharge recommendation as appropriate. Pt will benefit from PT services to address deficits in strength, balance, and mobility  in order to return to full function at home.     Follow Up Recommendations SNF    Equipment Recommendations  Rolling walker with 5" wheels    Recommendations for Other Services       Precautions / Restrictions Precautions Precautions: Fall Precaution Comments: Foley and rectal tube Restrictions Weight Bearing Restrictions: No      Mobility  Bed Mobility Overal bed mobility: Needs Assistance Bed Mobility: Supine to Sit     Supine to sit: Supervision     General bed mobility comments: Pt moves slowly with HOB elevated and use of bed rails. No external assist required but cues provided by therapist  Transfers Overall transfer level: Needs assistance Equipment used: Rolling walker (2 wheeled) Transfers: Sit to/from Stand Sit to Stand: Min guard         General transfer comment: Pt requires CGA for transfers but no external assist to transfer. Bed elevated for transfers to assist pt. Once in standing pt is stable with UE support on walker  Ambulation/Gait Ambulation/Gait assistance: Min assist Ambulation Distance (Feet): 80 Feet Assistive device: Rolling walker (2 wheeled) Gait Pattern/deviations: Decreased step length - right;Decreased step length - left;Decreased dorsiflexion - right;Decreased dorsiflexion - left Gait velocity: Decreased Gait velocity interpretation: <1.8 ft/sec, indicative of risk for recurrent falls General Gait Details: Pt ambulates slowly to RN station and back to bed. Pt takes short shuffling steps and requires repeated cues to stay within confines of walker. MinA+1 provided to support balance and assist with turns. Pt reports DOE with ambulation and RR increases. HR increases to  130 bpm but SaO2>95% throughout entire ambulation. Pt with difficulty following cues  Stairs            Wheelchair Mobility    Modified Rankin (Stroke Patients Only)       Balance Overall balance assessment: Needs assistance Sitting-balance support: No  upper extremity supported Sitting balance-Leahy Scale: Good     Standing balance support: Bilateral upper extremity supported Standing balance-Leahy Scale: Fair Standing balance comment: Requires UE support in standing                             Pertinent Vitals/Pain Pain Assessment: No/denies pain    Home Living Family/patient expects to be discharged to:: Unsure Living Arrangements: Group Home Available Help at Discharge: Personal care attendant Type of Home: House Home Access: Stairs to enter Entrance Stairs-Rails: Right Entrance Stairs-Number of Steps: 6 Home Layout: One level Home Equipment: None      Prior Function Level of Independence: Needs assistance   Gait / Transfers Assistance Needed: Independent ambulation without assistive device. No falls in the last 12 months.   ADL's / Homemaking Assistance Needed: Requires assist with ADLs/IADLs        Hand Dominance   Dominant Hand: Right    Extremity/Trunk Assessment   Upper Extremity Assessment Upper Extremity Assessment: Overall WFL for tasks assessed    Lower Extremity Assessment Lower Extremity Assessment: Generalized weakness       Communication   Communication: Expressive difficulties  Cognition Arousal/Alertness: Awake/alert Behavior During Therapy: Flat affect Overall Cognitive Status: History of cognitive impairments - at baseline                                 General Comments: Pt is AOx2 at time of evaluation, unable to provide DOB, current month/year, or situation      General Comments      Exercises     Assessment/Plan    PT Assessment Patient needs continued PT services  PT Problem List Decreased strength;Decreased activity tolerance;Decreased balance;Cardiopulmonary status limiting activity       PT Treatment Interventions DME instruction;Gait training;Stair training;Functional mobility training;Therapeutic activities;Therapeutic exercise;Balance  training;Neuromuscular re-education;Patient/family education    PT Goals (Current goals can be found in the Care Plan section)  Acute Rehab PT Goals Patient Stated Goal: Return to prior level of function and return to group home PT Goal Formulation: With family Time For Goal Achievement: 01/24/17 Potential to Achieve Goals: Good    Frequency Min 2X/week   Barriers to discharge        Co-evaluation               AM-PAC PT "6 Clicks" Daily Activity  Outcome Measure Difficulty turning over in bed (including adjusting bedclothes, sheets and blankets)?: A Little Difficulty moving from lying on back to sitting on the side of the bed? : A Little Difficulty sitting down on and standing up from a chair with arms (e.g., wheelchair, bedside commode, etc,.)?: A Little Help needed moving to and from a bed to chair (including a wheelchair)?: A Little Help needed walking in hospital room?: A Little Help needed climbing 3-5 steps with a railing? : A Lot 6 Click Score: 17    End of Session Equipment Utilized During Treatment: Gait belt Activity Tolerance: Patient limited by fatigue Patient left: in bed;with call bell/phone within reach;with bed alarm set Nurse Communication: Mobility status PT  Visit Diagnosis: Unsteadiness on feet (R26.81);Muscle weakness (generalized) (M62.81);Difficulty in walking, not elsewhere classified (R26.2)    Time: 6415-8309 PT Time Calculation (min) (ACUTE ONLY): 33 min   Charges:   PT Evaluation $PT Eval Moderate Complexity: 1 Mod PT Treatments $Gait Training: 8-22 mins   PT G Codes:   PT G-Codes **NOT FOR INPATIENT CLASS** Functional Assessment Tool Used: AM-PAC 6 Clicks Basic Mobility Functional Limitation: Mobility: Walking and moving around Mobility: Walking and Moving Around Current Status (M0768): At least 40 percent but less than 60 percent impaired, limited or restricted Mobility: Walking and Moving Around Goal Status [phone]): At least 1  percent but less than 20 percent impaired, limited or restricted    Phillips Grout PT, DPT    [name],Jason 01/10/2017, 4:28 PM

## 2017-01-10 NOTE — Progress Notes (Signed)
[facility] NAME: Jason Reed    MR#:  [id number]  DATE OF BIRTH:  1960-08-25  SUBJECTIVE:  CHIEF COMPLAINT:  Patient is arousable but lethargic, diarrhea improving No family members at bedside.  REVIEW OF SYSTEMS:  Review of systems unobtainable  DRUG ALLERGIES:   Allergies  Allergen Reactions  . Lisinopril     Unknown    VITALS:  Blood pressure 120/70, pulse (!) 111, temperature 98.2 F (36.8 C), resp. rate 18, height 6\' 2"  (1.88 m), weight 128 kg (282 lb 3 oz), SpO2 98 %.  PHYSICAL EXAMINATION:  GENERAL:  56 y.o.-year-old patient lying in the bed with no acute distress.  EYES: Pupils equal, round, reactive to light and accommodation. No scleral icterus.  HEENT: Head atraumatic, normocephalic. Oropharynx and nasopharynx clear.  NECK:  Supple, no jugular venous distention. No thyroid enlargement, no tenderness. Central line is present LUNGS: Moderate breath sounds bilaterally, no wheezing, rales,rhonchi or crepitation. No use of accessory muscles of respiration.  CARDIOVASCULAR: S1, S2 normal. No murmurs, rubs, or gallops.  ABDOMEN: Soft, nontender, nondistended. Bowel sounds present. No organomegaly or mass.  EXTREMITIES: No pedal edema, cyanosis, or clubbing.  NEUROLOGIC: Delirious PSYCHIATRIC: The patient is delirious  SKIN: No obvious rash, lesion, or ulcer.    LABORATORY PANEL:   CBC  Recent Labs Lab 01/10/17 0216  WBC 4.0  HGB 7.8*  HCT 24.3*  PLT 77*   ------------------------------------------------------------------------------------------------------------------  Chemistries   Recent Labs Lab 01/08/17 2308  01/10/17 0216 01/10/17 0933  NA 138  < > 138 140  K 5.4*  < > 5.0 4.6  CL 111  < > 117* 115*  CO2 20*  < > 16* 17*  GLUCOSE 167*  < > 165* 145*  BUN 32*  < > 27* 24*  CREATININE 2.10*  < > 1.65* 1.44*  CALCIUM 6.8*  < > 6.3* 7.2*  MG 1.9  < > 1.7  --   AST 42*  --   --    --   ALT 25  --   --   --   ALKPHOS 76  --   --   --   BILITOT 1.1  --   --   --   < > = values in this interval not displayed. ------------------------------------------------------------------------------------------------------------------  Cardiac Enzymes  Recent Labs Lab 01/08/17 1353  TROPONINI <0.03   ------------------------------------------------------------------------------------------------------------------  RADIOLOGY:  Ct Abdomen Pelvis Wo Contrast  Result Date: 01/08/2017 CLINICAL DATA:  Diarrhea.  Hypotension and tachycardia.  Confusion. EXAM: CT ABDOMEN AND PELVIS WITHOUT CONTRAST TECHNIQUE: Multidetector CT imaging of the abdomen and pelvis was performed following the standard protocol without IV contrast. COMPARISON:  None. FINDINGS: Lower chest: Respiratory motion artifact with mild dependent opacity in both lung bases, likely atelectasis. No pleural effusion. Coronary artery atherosclerosis. Normal heart size. Hepatobiliary: No focal liver abnormality is seen. No gallstones, gallbladder wall thickening, or biliary dilatation. Pancreas: Unremarkable. Spleen: Unremarkable. Adrenals/Urinary Tract: Unremarkable adrenal glands. Absent right kidney. Mild left hydronephrosis. Mild-to-moderate diffuse left ureteral dilatation without obstructing stone identified. Decompressed bladder with Foley catheter in place. Stomach/Bowel: The stomach is within normal limits. A rectal tube is in place. A small amount of liquid stool is present at the rectosigmoid region. There is moderate gaseous distension of the sigmoid colon, which is redundant. The sigmoid colon also has a somewhat featureless appearance though there is only at most mild wall thickening. A small amount of formed stool is present  in the hepatic and splenic flexures, and there is a small amount of fluid in the descending colon. There is mild gaseous distension of the transverse colon. There is no small bowel dilatation.  Vascular/Lymphatic: Normal caliber of the abdominal aorta. No enlarged lymph nodes. Reproductive: Unremarkable prostate. Other: No intraperitoneal free fluid. Small fat containing umbilical hernia. Musculoskeletal: Thoracolumbar spondylosis. Degenerative endplate irregularity and disc space narrowing at L4-5 and L5-S1 with neural foraminal stenosis at both levels. Grade 1 retrolisthesis of L5 on S1. IMPRESSION: 1. Moderate gaseous distension of the colon suggestive of ileus. Mild rectosigmoid wall thickening may indicate infectious colitis. 2. Mild-to-moderate left hydroureteronephrosis. No obstructing stone identified. Foley catheter in place. 3. Absent right kidney. Electronically Signed   By: Logan Bores M.D.   On: 01/08/2017 18:51   Dg Chest Portable 1 View  Result Date: 01/08/2017 CLINICAL DATA:  Central line placement EXAM: PORTABLE CHEST 1 VIEW COMPARISON:  01/08/2017 FINDINGS: Right central venous catheter tip overlies the right upper chest, presumably over venous confluence, allowing for patient rotation. No pneumothorax. Low lung volumes. Borderline cardiomegaly. IMPRESSION: 1. Right sided central venous catheter tip probably superimposes the venous confluence allowing for rotated patient. No pneumothorax. Electronically Signed   By: Donavan Foil M.D.   On: 01/08/2017 17:58   Dg Chest Port 1 View  Result Date: 01/08/2017 CLINICAL DATA:  Weakness EXAM: PORTABLE CHEST 1 VIEW COMPARISON:  None. FINDINGS: Hypoventilation with mild bibasilar atelectasis. Negative for heart failure or effusion IMPRESSION: Hypoventilation with mild bibasilar atelectasis. Electronically Signed   By: Franchot Gallo M.D.   On: 01/08/2017 14:55    EKG:   Orders placed or performed during the hospital encounter of 01/08/17  . EKG    ASSESSMENT AND PLAN:    #Acute encephalopathy secondary to septic shock Aggressive IV fluids Follow serial lactic acid levels,lactic acid normalized 1.9 Follow-up on the cultures On  IV antibiotics, neuro checks  #Septic shock with abdominal pain and diarrhea from acute infectious colitis and UTI Aggressive hydration with IV fluids so far received 13 L of fluid boluses IV antibiotics ciprofloxacin and Flagyl Follow-up with gastroenterology CT abdomen has revealed acute infectious colitis and possible ileus Repeat a.m. Labs Blood culture no growth in 2 days  #urinary tract infection from Escherichia coli Pansensitive, continue ciprofloxacin  #Acute renal failure from septic shock Hydrate with IV fluids and monitor renal function and avoid nephrotoxins Creatinine 2.10-1.8-1.77-1.65  #Diabetes mellitus sliding scale insulin at this time  Patient is from group home from underlying psychiatric condition, social worker is involved All the records are reviewed and case discussed with Care Management/Social Workerr.  CODE STATUS: FC,Until CODE STATUS determined  TOTAL TIME TAKING CARE OF THIS PATIENT: 35 minutes.   POSSIBLE D/C IN ?DAYS, DEPENDING ON CLINICAL CONDITION.  Note: This dictation was prepared with Dragon dictation along with smaller phrase technology. Any transcriptional errors that result from this process are unintentional.   Nicholes Mango M.D on 01/10/2017 at 1:07 PM  Between 7am to 6pm - Pager - [phone] After 6pm go to [email or web address] - password [email or web address] [facility]  Office  [phone]  CC: Primary care physician; Patient, No Pcp Per

## 2017-01-11 LAB — BASIC METABOLIC PANEL
ANION GAP: 4 — AB (ref 5–15)
ANION GAP: 6 (ref 5–15)
BUN: 24 mg/dL — ABNORMAL HIGH (ref 6–20)
BUN: 25 mg/dL — ABNORMAL HIGH (ref 6–20)
CHLORIDE: 117 mmol/L — AB (ref 101–111)
CO2: 19 mmol/L — ABNORMAL LOW (ref 22–32)
CO2: 17 mmol/L — AB (ref 22–32)
Calcium: 7.7 mg/dL — ABNORMAL LOW (ref 8.9–10.3)
Calcium: 7.7 mg/dL — ABNORMAL LOW (ref 8.9–10.3)
Chloride: 116 mmol/L — ABNORMAL HIGH (ref 101–111)
Creatinine, Ser: 1.29 mg/dL — ABNORMAL HIGH (ref 0.61–1.24)
Creatinine, Ser: 1.38 mg/dL — ABNORMAL HIGH (ref 0.61–1.24)
GFR calc non Af Amer: 60 mL/min — ABNORMAL LOW (ref >60)
GFR calc non Af Amer: 55 mL/min — ABNORMAL LOW (ref >60)
Glucose, Bld: 170 mg/dL — ABNORMAL HIGH (ref 65–99)
Glucose, Bld: 205 mg/dL — ABNORMAL HIGH (ref 65–99)
POTASSIUM: 3.9 mmol/L (ref 3.5–5.1)
POTASSIUM: 3.7 mmol/L (ref 3.5–5.1)
SODIUM: 140 mmol/L (ref 135–145)
Sodium: 139 mmol/L (ref 135–145)

## 2017-01-11 LAB — VITAMIN B12: VITAMIN B 12: 247 pg/mL (ref 180–914)

## 2017-01-11 LAB — CBC
HEMATOCRIT: 24.1 % — AB (ref 40.0–52.0)
HEMOGLOBIN: 7.9 g/dL — AB (ref 13.0–18.0)
MCH: 30.5 pg (ref 26.0–34.0)
MCHC: 32.8 g/dL (ref 32.0–36.0)
MCV: 92.9 fL (ref 80.0–100.0)
Platelets: 103 10*3/uL — ABNORMAL LOW (ref 150–440)
RBC: 2.59 MIL/uL — AB (ref 4.40–5.90)
RDW: 15.7 % — ABNORMAL HIGH (ref 11.5–14.5)
WBC: 7.8 10*3/uL (ref 3.8–10.6)

## 2017-01-11 LAB — URINE CULTURE: CULTURE: NO GROWTH

## 2017-01-11 LAB — IRON AND TIBC
IRON: 27 ug/dL — AB (ref 45–182)
SATURATION RATIOS: 19 % (ref 17.9–39.5)
TIBC: 141 ug/dL — ABNORMAL LOW (ref 250–450)
UIBC: 114 ug/dL

## 2017-01-11 LAB — GLUCOSE, CAPILLARY
GLUCOSE-CAPILLARY: 140 mg/dL — AB (ref 65–99)
GLUCOSE-CAPILLARY: 158 mg/dL — AB (ref 65–99)
Glucose-Capillary: 160 mg/dL — ABNORMAL HIGH (ref 65–99)
Glucose-Capillary: 180 mg/dL — ABNORMAL HIGH (ref 65–99)

## 2017-01-11 LAB — FERRITIN: FERRITIN: 503 ng/mL — AB (ref 24–336)

## 2017-01-11 LAB — PHOSPHORUS: Phosphorus: 2.5 mg/dL (ref 2.5–4.6)

## 2017-01-11 LAB — FOLATE: FOLATE: 9.6 ng/mL (ref >5.9)

## 2017-01-11 LAB — MAGNESIUM: Magnesium: 1.8 mg/dL (ref 1.7–2.4)

## 2017-01-11 MED ORDER — CIPROFLOXACIN HCL 500 MG PO TABS
500.0000 mg | ORAL_TABLET | Freq: Two times a day (BID) | ORAL | Status: DC
  Administered 2017-01-11 – 2017-01-12 (×3): 500 mg via ORAL
  Filled 2017-01-11 (×3): qty 1

## 2017-01-11 MED ORDER — GLUCERNA SHAKE PO LIQD
237.0000 mL | Freq: Three times a day (TID) | ORAL | Status: DC
  Administered 2017-01-11 – 2017-01-12 (×5): 237 mL via ORAL

## 2017-01-11 NOTE — Progress Notes (Signed)
Rectal tube removed per Dr. Marius Ditch (GI doctor). Pt tolerated without problem.

## 2017-01-11 NOTE — Progress Notes (Signed)
[name], MD [address]  [place]  [place], [place] [address]  Main: [phone]  Fax: [phone] Pager: [phone]   Matisse Salais is being followed for acute diarrhea Day 1 of follow up    Subjective: Transferred to floor last night  More alert and awake, feeling better He had 200 mL of liquid brown stool since 7 PM to 7 AM  He denies any abdominal pain, nausea, vomiting   Objective: Vital signs in last 24 hours: Vitals:   01/10/17 1200 01/10/17 1339 01/10/17 2028 01/11/17 0500  BP: 120/70 (!) 143/80 116/64 118/68  Pulse: (!) 111 (!) 109 (!) 116 (!) 115  Resp: _0 Temp: 98.2 F (36.8 C) 98 F (36.7 C) 99.4 F (37.4 C) 98.5 F (36.9 C)  TempSrc:  Oral Oral Oral  SpO2: 98% 99% 98% 98%  Weight:      Height:       Weight change:   Intake/Output Summary (Last 24 hours) at 01/11/17 0915 Last data filed at 01/11/17 0531  Gross per 24 hour  Intake          5312.33 ml  Output             2800 ml  Net          2512.33 ml     Exam: Heart:: Regular rate and rhythm or S1S2 present Lungs: clear to auscultation Abdomen: soft, nontender, normal bowel sounds   Lab Results: CBC Latest Ref Rng & Units 01/11/2017 01/10/2017 01/09/2017  WBC 3.8 - 10.6 K/uL 7.8 4.0 5.1  Hemoglobin 13.0 - 18.0 g/dL 7.9(L) 7.8(L) 9.3(L)  Hematocrit 40.0 - 52.0 % 24.1(L) 24.3(L) 29.1(L)  Platelets 150 - 440 K/uL 103(L) 77(L) 84(L)   BMP Latest Ref Rng & Units 01/11/2017 01/10/2017 01/10/2017  Glucose 65 - 99 mg/dL 170(H) 160(H) 125(H)  BUN 6 - 20 mg/dL 24(H) 23(H) 26(H)  Creatinine 0.61 - 1.24 mg/dL 1.29(H) 1.40(H) 1.39(H)  Sodium 135 - 145 mmol/L 140 138 138  Potassium 3.5 - 5.1 mmol/L 3.9 3.9 4.3  Chloride 101 - 111 mmol/L 117(H) 116(H) 116(H)  CO2 22 - 32 mmol/L 19(L) 17(L) 16(L)  Calcium 8.9 - 10.3 mg/dL 7.7(L) 7.5(L) 7.6(L)   Hepatic Function Latest Ref Rng & Units 01/08/2017 01/08/2017  Total Protein 6.5 - 8.1 g/dL 5.3(L) 5.9(L)  Albumin 3.5 - 5.0 g/dL 2.6(L)  2.9(L)  AST 15 - 41 U/L 42(H) 35  ALT 17 - 63 U/L 25 20  Alk Phosphatase 38 - 126 U/L 76 90  Total Bilirubin 0.3 - 1.2 mg/dL 1.1 1.1    Micro Results: Recent Results (from the past 240 hour(s))  Blood Culture (routine x 2)     Status: None (Preliminary result)   Collection Time: 01/08/17  1:54 PM  Result Value Ref Range Status   Specimen Description BLOOD RIGHT HAND  Final   Special Requests   Final    BOTTLES DRAWN AEROBIC AND ANAEROBIC Blood Culture results may not be optimal due to an excessive volume of blood received in culture bottles   Culture NO GROWTH 3 DAYS  Final   Report Status PENDING  Incomplete  Blood Culture (routine x 2)     Status: None (Preliminary result)   Collection Time: 01/08/17  1:54 PM  Result Value Ref Range Status   Specimen Description BLOOD LEFT HAND  Final   Special Requests   Final    BOTTLES DRAWN AEROBIC AND ANAEROBIC Blood Culture results may  not be optimal due to an excessive volume of blood received in culture bottles   Culture NO GROWTH 3 DAYS  Final   Report Status PENDING  Incomplete  Urine culture     Status: Abnormal   Collection Time: 01/08/17  2:21 PM  Result Value Ref Range Status   Specimen Description URINE, RANDOM  Final   Special Requests NONE  Final   Culture >=100,000 COLONIES/mL ESCHERICHIA COLI (A)  Final   Report Status 01/10/2017 FINAL  Final   Organism ID, Bacteria ESCHERICHIA COLI (A)  Final      Susceptibility   Escherichia coli - MIC*    AMPICILLIN 8 SENSITIVE Sensitive     CEFAZOLIN <=4 SENSITIVE Sensitive     CEFTRIAXONE <=1 SENSITIVE Sensitive     CIPROFLOXACIN <=0.25 SENSITIVE Sensitive     GENTAMICIN <=1 SENSITIVE Sensitive     IMIPENEM <=0.25 SENSITIVE Sensitive     NITROFURANTOIN <=16 SENSITIVE Sensitive     TRIMETH/SULFA <=20 SENSITIVE Sensitive     AMPICILLIN/SULBACTAM 4 SENSITIVE Sensitive     PIP/TAZO <=4 SENSITIVE Sensitive     Extended ESBL NEGATIVE Sensitive     * >=100,000 COLONIES/mL ESCHERICHIA  COLI  Gastrointestinal Panel by PCR , Stool     Status: None   Collection Time: 01/08/17  2:22 PM  Result Value Ref Range Status   Campylobacter species NOT DETECTED NOT DETECTED Final   Plesimonas shigelloides NOT DETECTED NOT DETECTED Final   Salmonella species NOT DETECTED NOT DETECTED Final   Yersinia enterocolitica NOT DETECTED NOT DETECTED Final   Vibrio species NOT DETECTED NOT DETECTED Final   Vibrio cholerae NOT DETECTED NOT DETECTED Final   Enteroaggregative E coli (EAEC) NOT DETECTED NOT DETECTED Final   Enteropathogenic E coli (EPEC) NOT DETECTED NOT DETECTED Final   Enterotoxigenic E coli (ETEC) NOT DETECTED NOT DETECTED Final   Shiga like toxin producing E coli (STEC) NOT DETECTED NOT DETECTED Final   Shigella/Enteroinvasive E coli (EIEC) NOT DETECTED NOT DETECTED Final   Cryptosporidium NOT DETECTED NOT DETECTED Final   Cyclospora cayetanensis NOT DETECTED NOT DETECTED Final   Entamoeba histolytica NOT DETECTED NOT DETECTED Final   Giardia lamblia NOT DETECTED NOT DETECTED Final   Adenovirus F40/41 NOT DETECTED NOT DETECTED Final   Astrovirus NOT DETECTED NOT DETECTED Final   Norovirus GI/GII NOT DETECTED NOT DETECTED Final   Rotavirus A NOT DETECTED NOT DETECTED Final   Sapovirus (I, II, IV, and V) NOT DETECTED NOT DETECTED Final  MRSA PCR Screening     Status: None   Collection Time: 01/08/17  6:57 PM  Result Value Ref Range Status   MRSA by PCR NEGATIVE NEGATIVE Final    Comment:        The GeneXpert MRSA Assay (FDA approved for NASAL specimens only), is one component of a comprehensive MRSA colonization surveillance program. It is not intended to diagnose MRSA infection nor to guide or monitor treatment for MRSA infections.   C difficile quick scan w PCR reflex     Status: None   Collection Time: 01/08/17  8:34 PM  Result Value Ref Range Status   C Diff antigen NEGATIVE NEGATIVE Final   C Diff toxin NEGATIVE NEGATIVE Final   C Diff interpretation No C.  difficile detected.  Final  Urine Culture     Status: None   Collection Time: 01/09/17  4:25 PM  Result Value Ref Range Status   Specimen Description URINE, RANDOM  Final   Special Requests NONE  Final   Culture   Final    NO GROWTH Performed at [facility], [place] [address] [place], [place] [address]    Report Status 01/11/2017 FINAL  Final   Studies/Results: No results found. Medications: I have reviewed the patient's current medications. Scheduled Meds: . ARIPiprazole  15 mg Oral Daily  . aspirin EC  81 mg Oral Daily  . ciprofloxacin  500 mg Oral BID  . enoxaparin (LOVENOX) injection  40 mg Subcutaneous Q24H  . feeding supplement (GLUCERNA SHAKE)  237 mL Oral TID BM  . gabapentin  300 mg Oral TID  . insulin aspart  0-9 Units Subcutaneous TID WC  . loratadine  10 mg Oral Daily  . mouth rinse  15 mL Mouth Rinse BID  . mirtazapine  15 mg Oral QHS  . phenytoin  100 mg Oral BID  . QUEtiapine  800 mg Oral QHS  . rifaximin  200 mg Oral Q8H  . simvastatin  20 mg Oral QHS  . sodium chloride flush  10-40 mL Intracatheter Q12H  . traZODone  50 mg Oral QHS   Continuous Infusions: . sodium chloride 150 mL/hr at 01/11/17 0336  . ibuprofen (CALDOLOR) IV Stopped (01/09/17 1300)   PRN Meds:.acetaminophen **OR** acetaminophen, clonazePAM, guaiFENesin-dextromethorphan, ibuprofen (CALDOLOR) IV, loperamide, ondansetron **OR** ondansetron (ZOFRAN) IV, ondansetron, senna-docusate, sodium chloride flush   Assessment: Active Problems:   Septic shock (HCC)  Jahmere Bramel is a 56 y.o. y/o male with MR, diabetes, hypertension with acute febrile nonbloody diarrhea leading to severe hypovolemia and and AKI. Most likely infectious diarrhea. However, GI pathogen panel for various bacterial and viral infections and C. Difficile have been negative. HIV negative. He also developed mild pancytopenia most likely secondary to aggressive fluid resuscitation Which is improving. Diarrhea is also  improving. Clinically getting better. Serum cortisol levels normal. TSH normal. Urine drug screen negative. He continues to have normocytic anemia, there is no evidence of active GI bleed Fecal lactoferrin positive. Elevated sedimentation rate. Creatinine is significantly improved. Afebrile in last 24 hours. He received azithromycin 1 g  Plan: Advance diet Nutrition consult Can decrease or stop IV fluids if patient able to tolerate by mouth Monitor electrolytes and replete as necessary Check ferritin, iron studies, B12 and folate acid Continue Imodium when necessary  Continue ciprofloxacin for 5 days total  Continue rifaximin for 3 days total  Anticipate DC home in next 1-2 days GI clinic follow-up in 2-4 weeks after discharge  I'll continue to follow    LOS: 3 days   Rohini Vanga 01/11/2017, 9:15 AM

## 2017-01-11 NOTE — Care Management (Addendum)
Patient move out of icu to 1C 01/10/2017.  He is a resident of [facility] but patient says he is from "[facility]."  Physical therapy consult recommending skilled nursing placement.  Patient says "I don't know if I have a walker."  CSW is involved.

## 2017-01-11 NOTE — Progress Notes (Signed)
Pt is A&O x2. Rectal tube removed this morning- no diarrhea today. Foley remains in place. PT recommending SNF. IVFs infusing. Sinus Tach low 100's on telemetry. Good appetite- needs set up assistance.

## 2017-01-11 NOTE — Progress Notes (Addendum)
Patient is from [facility] ([place], [place]) [place] (caregiver/lives at facility is point of contact) [phone]

## 2017-01-11 NOTE — Progress Notes (Signed)
[facility] NAME: Jason Reed    MR#:  [id number]  DATE OF BIRTH:  [birth year]  SUBJECTIVE:  CHIEF COMPLAINT:  Patient is More alert and answering questions diarrhea improving No family members at bedside.  REVIEW OF SYSTEMS:    Review of Systems  Constitutional: Negative for chills and fever.  HENT: Negative for hearing loss.   Eyes: Negative for blurred vision, double vision and photophobia.  Respiratory: Negative for cough, hemoptysis and shortness of breath.   Cardiovascular: Negative for palpitations, orthopnea and leg swelling.  Gastrointestinal: Negative for abdominal pain, diarrhea improving and denies vomiting.  Genitourinary: Negative for dysuria and urgency.  Musculoskeletal: Negative for myalgias and neck pain.  Skin: Negative for rash.  Neurological: report generalized weakness. Negative for dizziness, seizures and headaches.  Psychiatric/Behavioral: Negative for memory loss. The patient does not have insomnia.     DRUG ALLERGIES:   Allergies  Allergen Reactions  . Lisinopril     Unknown    VITALS:  Blood pressure 122/72, pulse (!) 110, temperature 98.8 F (37.1 C), temperature source Oral, resp. rate 20, height 6\' 2"  (1.88 m), weight 128 kg (282 lb 3 oz), SpO2 100 %.  PHYSICAL EXAMINATION:  GENERAL:  56 y.o.-year-old patient lying in the bed with no acute distress.  EYES: Pupils equal, round, reactive to light and accommodation. No scleral icterus.  HEENT: Head atraumatic, normocephalic. Oropharynx and nasopharynx clear.  NECK:  Supple, no jugular venous distention. No thyroid enlargement, no tenderness. Central line is present LUNGS: Moderate breath sounds bilaterally, no wheezing, rales,rhonchi or crepitation. No use of accessory muscles of respiration.  CARDIOVASCULAR: S1, S2 normal. No murmurs, rubs, or gallops.  ABDOMEN: Soft, nontender, nondistended. Bowel sounds present.  EXTREMITIES: No  pedal edema, cyanosis, or clubbing.  NEUROLOGIC: Delirious PSYCHIATRIC: The patient is delirious  SKIN: No obvious rash, lesion, or ulcer.    LABORATORY PANEL:   CBC  Recent Labs Lab 01/11/17 0556  WBC 7.8  HGB 7.9*  HCT 24.1*  PLT 103*   ------------------------------------------------------------------------------------------------------------------  Chemistries   Recent Labs Lab 01/08/17 2308  01/11/17 0556 01/11/17 1013  NA 138  < > 140 139  K 5.4*  < > 3.9 3.7  CL 111  < > 117* 116*  CO2 20*  < > 19* 17*  GLUCOSE 167*  < > 170* 205*  BUN 32*  < > 24* 25*  CREATININE 2.10*  < > 1.29* 1.38*  CALCIUM 6.8*  < > 7.7* 7.7*  MG 1.9  < > 1.8  --   AST 42*  --   --   --   ALT 25  --   --   --   ALKPHOS 76  --   --   --   BILITOT 1.1  --   --   --   < > = values in this interval not displayed. ------------------------------------------------------------------------------------------------------------------  Cardiac Enzymes  Recent Labs Lab 01/08/17 1353  TROPONINI <0.03   ------------------------------------------------------------------------------------------------------------------  RADIOLOGY:  No results found.  EKG:   Orders placed or performed during the hospital encounter of 01/08/17  . EKG    ASSESSMENT AND PLAN:    #Acute encephalopathy secondary to septic shock D/c  IV fluids,advance diet as tolerated Follow serial lactic acid levels,lactic acid normalized 1.9 Follow-up on the cultures,blood cultures negative urine culture with the Escherichia coli pansensitive Stool for C. Difficile toxin negative;GI panel is negative On IV antibiotics, neuro checks  #  Septic shock with abdominal pain and diarrhea from acute infectious colitis and UTI Aggressive hydration with IV fluids provided while patient is in ICU IV antibiotics ciprofloxacin and Flagyl Follow-up with gastroenterology,outpatient follow-up in 2 weeks after discharge CT abdomen has  revealed acute infectious colitis and possible ileus Repeat a.m. Labs Blood culture no growth in 2 days  #urinary tract infection from Escherichia coli Pansensitive, continue ciprofloxacin  #Acute renal failure from septic shock Hydrate with IV fluids and monitor renal function and avoid nephrotoxins Creatinine 2.10-1.8-1.77-1.65-1.38  #Diabetes mellitus sliding scale insulin at this time  PT consult, and the discharge in a.m.if medically stable Patient is from group home from underlying psychiatric condition, social worker is involved All the records are reviewed and case discussed with Care Management/Social Workerr.  CODE STATUS: FC,Until CODE STATUS determined  TOTAL TIME TAKING CARE OF THIS PATIENT: 35 minutes.   POSSIBLE D/C IN am DAYS, DEPENDING ON CLINICAL CONDITION.  Note: This dictation was prepared with Dragon dictation along with smaller phrase technology. Any transcriptional errors that result from this process are unintentional.   Nicholes Mango M.D on 01/11/2017 at 2:46 PM  Between 7am to 6pm - Pager - [phone] After 6pm go to [email or web address] - password [email or web address] [facility]  Office  [phone]  CC: Primary care physician; Patient, No Pcp Per

## 2017-01-11 NOTE — Progress Notes (Signed)
Physical Therapy Treatment Patient Details Name: Jason Reed MRN: [id number] DOB: 09-24-1960 Today's Date: 01/11/2017    History of Present Illness Jason Reed  is a 56 y.o. male with a known history of diabetes and essential hypertension who comes in group home due to profuse diarrhea and altered mental status. Per CSW note the patient was found having wandered from the home by [place] in a field sitting in a chair and covered in feces Apparently, this morning patient woke up with profuse diarrhea. His blood pressure was reported to be 73/35 with heart rate 116 and he had some confusion. He was brought to the ER for further evaluation where he was noted to have persistent hypotension. He is mentating however he is very slow to answer any questions. Pt with history of MR. He has been given a total of 4-1/2 L of fluid however his systolic blood pressure remains in the 70s to 80s. He was admitted to the CCU but has transferred to the floor at [facility] time of PT evaluation.   Pt continues to move slowly with bed mobility. HOB maximally elevated and bed rails utilized. Bed mobility is labored but eventually able to perform without external assist. Pt requiring multiple attempts to come to standing today. Cues for safe hand placement and bed is elevated to help with transfesr. Once upright pt is stable with UE support on rolling walker. Pt requires elevated surface and transfer requires considerable effort by patient. Pt is able to ambulate in hallway with therapist but demonstrates impulsivity and poor safety awareness. Requires continual cues to stay within confines of walker and occasional minA+1 to direct walker and assist with turns. Vitals monitored and HR increases to 135bpm during ambulation. Pt with increase in respiration rate and notable fatigue. He reports feeling SOB but   PT Comments    Pt continues to move slowly with bed mobility during treatment session today. HOB maximally elevated and  bed rails utilized. Bed mobility is labored but eventually he is able to perform without external assist. Pt requiring multiple attempts to come to standing today. Cues for safe hand placement and bed is elevated to help with transfesr. Once upright pt is stable with UE support on rolling walker. Pt requires elevated surface and transfer requires considerable effort by patient. He is able to ambulate increased distance in hallway today with therapist but demonstrates impulsivity and poor safety awareness. Requires continual cues to stay within confines of walker and occasional minA+1 to direct walker and assist with turns. Vitals monitored and HR increases to 135bpm during ambulation. Pt with increase in respiration rate and notable fatigue. He reports feeling SOB but SaO2 is >95% on room air. Despite increased ambulation distance continue to recommend SNF placment due to poor balance, impulsivity, and considerable deconditioning with transfers and ambulation. Pt will benefit from PT services to address deficits in strength, balance, and mobility in order to return to full function at home.    Follow Up Recommendations  SNF     Equipment Recommendations  Rolling walker with 5" wheels    Recommendations for Other Services       Precautions / Restrictions Precautions Precautions: Fall Precaution Comments: Foley Restrictions Weight Bearing Restrictions: No    Mobility  Bed Mobility Overal bed mobility: Needs Assistance Bed Mobility: Supine to Sit;Sit to Supine     Supine to sit: Supervision Sit to supine: Supervision   General bed mobility comments: Pt continues to move slowly with bed mobility. HOB maximally elevated and  bed rails utilized. Bed mobility is labored but eventually able to perform without external assist  Transfers Overall transfer level: Needs assistance Equipment used: Rolling walker (2 wheeled) Transfers: Sit to/from Stand Sit to Stand: Min guard         General  transfer comment: Pt requiring multiple attempts to come to standing today. Cues for safe hand placement and bed is elevated to help with transfesr. Once upright pt is stable with UE support on rolling walker. Pt requires elevated surface and transfer requires considerable effort by patient  Ambulation/Gait Ambulation/Gait assistance: Min assist Ambulation Distance (Feet): 180 Feet Assistive device: Rolling walker (2 wheeled) Gait Pattern/deviations: Decreased step length - right;Decreased step length - left;Decreased dorsiflexion - right;Decreased dorsiflexion - left Gait velocity: Decreased Gait velocity interpretation: <1.8 ft/sec, indicative of risk for recurrent falls General Gait Details: Pt is able to ambulate in hallway with therapist but demonstrates impulsivity and poor safety awareness. Requires continual cues to stay within confines of walker and occasional minA+1 to direct walker and assist with turns. Vitals monitored and HR increases to 135bpm during ambulation. Pt with increase in respiration rate and notable fatigue. He reports feeling SOB but SaO2 is >95% on room air.    Stairs            Wheelchair Mobility    Modified Rankin (Stroke Patients Only)       Balance Overall balance assessment: Needs assistance Sitting-balance support: No upper extremity supported Sitting balance-Leahy Scale: Good     Standing balance support: Bilateral upper extremity supported Standing balance-Leahy Scale: Fair Standing balance comment: Requires UE support in standing                            Cognition Arousal/Alertness: Awake/alert Behavior During Therapy: Flat affect Overall Cognitive Status: History of cognitive impairments - at baseline                                        Exercises General Exercises - Lower Extremity Long Arc Quad: Strengthening;Both;10 reps;Seated Heel Slides: Strengthening;Both;10 reps;Seated Hip  ABduction/ADduction: Strengthening;Both;10 reps;Seated Hip Flexion/Marching: Strengthening;Both;10 reps;Seated Heel Raises: Strengthening;Both;10 reps;Seated    General Comments        Pertinent Vitals/Pain Pain Assessment: No/denies pain    Home Living                      Prior Function            PT Goals (current goals can now be found in the care plan section) Acute Rehab PT Goals Patient Stated Goal: Return to prior level of function and return to group home PT Goal Formulation: With family Time For Goal Achievement: 01/24/17 Potential to Achieve Goals: Good Progress towards PT goals: Progressing toward goals    Frequency    Min 2X/week      PT Plan Current plan remains appropriate    Co-evaluation              AM-PAC PT "6 Clicks" Daily Activity  Outcome Measure  Difficulty turning over in bed (including adjusting bedclothes, sheets and blankets)?: A Little Difficulty moving from lying on back to sitting on the side of the bed? : A Little Difficulty sitting down on and standing up from a chair with arms (e.g., wheelchair, bedside commode, etc,.)?: A Lot Help needed moving to  and from a bed to chair (including a wheelchair)?: A Little Help needed walking in hospital room?: A Little Help needed climbing 3-5 steps with a railing? : A Lot 6 Click Score: 16    End of Session Equipment Utilized During Treatment: Gait belt Activity Tolerance: Patient limited by fatigue Patient left: in bed;with call bell/phone within reach;with bed alarm set   PT Visit Diagnosis: Unsteadiness on feet (R26.81);Muscle weakness (generalized) (M62.81);Difficulty in walking, not elsewhere classified (R26.2)     Time: 1443-1540 PT Time Calculation (min) (ACUTE ONLY): 24 min  Charges:  $Gait Training: 8-22 mins $Therapeutic Exercise: 8-22 mins                    G Codes:       Lyndel Safe Zanyiah Posten PT, DPT     Wendy Mikles 01/11/2017, 5:23 PM

## 2017-01-12 LAB — HIGH SENSITIVITY CRP: CRP, High Sensitivity: >300 mg/L — AB (ref 0.00–3.00)

## 2017-01-12 LAB — HEMOGLOBIN A1C
HEMOGLOBIN A1C: 5.8 % — AB (ref 4.8–5.6)
MEAN PLASMA GLUCOSE: 120 mg/dL

## 2017-01-12 LAB — GLUCOSE, CAPILLARY
GLUCOSE-CAPILLARY: 148 mg/dL — AB (ref 65–99)
GLUCOSE-CAPILLARY: 143 mg/dL — AB (ref 65–99)
Glucose-Capillary: 156 mg/dL — ABNORMAL HIGH (ref 65–99)

## 2017-01-12 LAB — PHENYTOIN LEVEL, FREE AND TOTAL
PHENYTOIN FREE: 1 ug/mL (ref 1.0–2.0)
PHENYTOIN, TOTAL: 4.4 ug/mL — AB (ref 10.0–20.0)

## 2017-01-12 MED ORDER — CIPROFLOXACIN HCL 500 MG PO TABS: 500.0000 mg | ORAL_TABLET | Freq: Two times a day (BID) | ORAL | 0 refills | Status: DC

## 2017-01-12 MED ORDER — ENSURE ENLIVE PO LIQD: 237.0000 mL | Freq: Two times a day (BID) | ORAL | Status: DC

## 2017-01-12 MED ORDER — GLUCERNA SHAKE PO LIQD: 237.0000 mL | Freq: Three times a day (TID) | ORAL | 0 refills | Status: DC

## 2017-01-12 MED ORDER — ENSURE ENLIVE PO LIQD: 237.0000 mL | Freq: Two times a day (BID) | ORAL | 0 refills | Status: DC

## 2017-01-12 NOTE — Care Management Important Message (Signed)
Important Message  Patient Details  Name: Jason Reed MRN: [id number] Date of Birth: 1961/01/28   Medicare Important Message Given:  Yes    Shelbie Ammons, RN 01/12/2017, 7:57 AM

## 2017-01-12 NOTE — Discharge Instructions (Signed)
Follow-up with her primary care physician in 2-3 days F/u with GI - IN 2 WEEKS

## 2017-01-12 NOTE — NC FL2 (Signed)
[place] LEVEL OF CARE SCREENING TOOL     IDENTIFICATION  Patient Name: Jason Reed Birthdate: 07/08/1960 Sex: male Admission Date (Current Location): 01/08/2017  [place] and [place] Number:  Jason Reed [id number] [place] and Address:  [facility], [address] [place], [place] [address]      Provider Number: [id number]  Attending Physician Name and Address:  Nicholes Mango, MD  Relative Name and Phone Number:  Dondra Prader [phone]     Current Level of Care: Hospital Recommended Level of Care: [place] Prior Approval Number:    Date Approved/Denied:   PASRR Number: [id number]  Discharge Plan: SNF    Current Diagnoses: Patient Active Problem List   Diagnosis Date Noted  . Septic shock ([place]) 01/08/2017    Orientation RESPIRATION BLADDER Height & Weight     Self, Place  Normal Continent Weight: 282 lb 3 oz (128 kg) Height:  6\' 2"  (188 cm)  BEHAVIORAL SYMPTOMS/MOOD NEUROLOGICAL BOWEL NUTRITION STATUS      Continent Diet (2g sodium diet)  AMBULATORY STATUS COMMUNICATION OF NEEDS Skin   Limited Assist Verbally Normal                       Personal Care Assistance Level of Assistance  Bathing, Feeding, Dressing Bathing Assistance: Limited assistance Feeding assistance: Limited assistance Dressing Assistance: Limited assistance     Functional Limitations Info  Sight, Hearing, Speech Sight Info: Adequate Hearing Info: Adequate Speech Info: Adequate    SPECIAL CARE FACTORS FREQUENCY  PT (By licensed PT)     PT Frequency: 5x a week              Contractures Contractures Info: Not present    Additional Factors Info  Code Status, Allergies, Psychotropic, Insulin Sliding Scale Code Status Info: Full Code Allergies Info: Lisinopril Psychotropic Info: ARIPiprazole (ABILIFY) tablet 15 mg, mirtazapine (REMERON SOL-TAB) disintegrating tablet 15 mg, QUEtiapine (SEROQUEL) tablet 800  mg Insulin Sliding Scale Info: insulin aspart (novoLOG) injection 0-9 Units 3x a day with meals       Current Medications (01/12/2017):  This is the current hospital active medication list Current Facility-Administered Medications  Medication Dose Route Frequency Provider Last Rate Last Dose  . acetaminophen (TYLENOL) tablet 650 mg  650 mg Oral Q6H PRN Bettey Costa, MD   650 mg at 01/09/17 0505   Or  . acetaminophen (TYLENOL) suppository 650 mg  650 mg Rectal Q6H PRN Mody, Sital, MD      . ARIPiprazole (ABILIFY) tablet 15 mg  15 mg Oral Daily Bettey Costa, MD   15 mg at 01/12/17 0834  . aspirin EC tablet 81 mg  81 mg Oral Daily Bettey Costa, MD   81 mg at 01/12/17 0833  . ciprofloxacin (CIPRO) tablet 500 mg  500 mg Oral BID Lin Landsman, MD   500 mg at 01/12/17 0834  . clonazePAM (KLONOPIN) tablet 1 mg  1 mg Oral TID PRN Bettey Costa, MD      . enoxaparin (LOVENOX) injection 40 mg  40 mg Subcutaneous Q24H Bettey Costa, MD   40 mg at 01/11/17 2236  . feeding supplement (ENSURE ENLIVE) (ENSURE ENLIVE) liquid 237 mL  237 mL Oral BID BM Vanga, Tally Due, MD      . feeding supplement (GLUCERNA SHAKE) (GLUCERNA SHAKE) liquid 237 mL  237 mL Oral TID BM Lin Landsman, MD   237 mL at 01/12/17 1224  . gabapentin (NEURONTIN) capsule 300 mg  300 mg Oral TID Bettey Costa, MD   300 mg at 01/12/17 0834  . guaiFENesin-dextromethorphan (ROBITUSSIN DM) 100-10 MG/5ML syrup 10 mL  10 mL Oral Q4H PRN Mody, Sital, MD      . ibuprofen (CALDOLOR) 600 mg in sodium chloride 0.9 % 250 mL IVPB  600 mg Intravenous Q6H PRN Mikael Spray, NP   Stopped at 01/09/17 1300  . insulin aspart (novoLOG) injection 0-9 Units  0-9 Units Subcutaneous TID WC Bettey Costa, MD   2 Units at 01/12/17 1224  . loperamide (IMODIUM) capsule 4 mg  4 mg Oral Q6H PRN Lin Landsman, MD   4 mg at 01/09/17 1750  . loratadine (CLARITIN) tablet 10 mg  10 mg Oral Daily Bettey Costa, MD   10 mg at 01/12/17 0834  . MEDLINE mouth rinse   15 mL Mouth Rinse BID Dorene Sorrow S, NP   15 mL at 01/12/17 0834  . mirtazapine (REMERON SOL-TAB) disintegrating tablet 15 mg  15 mg Oral QHS Bettey Costa, MD   15 mg at 01/11/17 2235  . ondansetron (ZOFRAN) tablet 4 mg  4 mg Oral Q6H PRN Bettey Costa, MD       Or  . ondansetron (ZOFRAN) injection 4 mg  4 mg Intravenous Q6H PRN Mody, Sital, MD      . ondansetron (ZOFRAN-ODT) disintegrating tablet 4 mg  4 mg Oral Q6H PRN Mody, Sital, MD      . phenytoin (DILANTIN) ER capsule 100 mg  100 mg Oral BID Bettey Costa, MD   100 mg at 01/12/17 0833  . QUEtiapine (SEROQUEL) tablet 800 mg  800 mg Oral QHS Bettey Costa, MD   800 mg at 01/11/17 2236  . rifaximin (XIFAXAN) tablet 200 mg  200 mg Oral Q8H Vanga, Tally Due, MD   200 mg at 01/12/17 0538  . senna-docusate (Senokot-S) tablet 1 tablet  1 tablet Oral QHS PRN Bettey Costa, MD      . simvastatin (ZOCOR) tablet 20 mg  20 mg Oral QHS Bettey Costa, MD   20 mg at 01/11/17 2236  . sodium chloride flush (NS) 0.9 % injection 10-40 mL  10-40 mL Intracatheter Q12H Tukov, Magadalene S, NP   20 mL at 01/12/17 0835  . sodium chloride flush (NS) 0.9 % injection 10-40 mL  10-40 mL Intracatheter PRN Mikael Spray, NP   12 mL at 01/11/17 2237  . traZODone (DESYREL) tablet 50 mg  50 mg Oral QHS Bettey Costa, MD   50 mg at 01/11/17 2236     Discharge Medications: Please see discharge summary for a list of discharge medications.  Relevant Imaging Results:  Relevant Lab Results:   Additional Information SSN [id number]  Ross Ludwig, [place]

## 2017-01-12 NOTE — Discharge Summary (Signed)
[facility] [place] NAME: Jason Reed    MR#:  [id number]  DATE OF BIRTH:  04-21-1961  DATE OF ADMISSION:  01/08/2017 ADMITTING PHYSICIAN: Bettey Costa, MD  DATE OF DISCHARGE: 01/12/17   PRIMARY CARE PHYSICIAN: Patient, No Pcp Per    ADMISSION DIAGNOSIS:  Septic shock ([place]) [A41.9, R65.21]  DISCHARGE DIAGNOSIS:  Active Problems:   Septic shock ([place])   SECONDARY DIAGNOSIS:   Past Medical History:  Diagnosis Date  . Diabetes ([place])   . HTN (hypertension)     HOSPITAL COURSE:   HPI  Jason Reed  is a 56 y.o. male with a known history of Diabetes and essential hypertension who comes in group home due to profuse diarrhea and altered mental status. Apparently this morning patient woke up with profuse diarrhea. His blood pressure was reported to be 73/35 with heart rate 116 and he had some confusion. He was brought to the ER for further evaluation where he was noted to have persistent hypotension. He is mentating however he is very slow to answer any questions. He has been given a total of 4-1/2 L of fluid however his systolic blood pressure remains in the 70s to 80s.   Hospital course :  #Acute encephalopathy secondary to septic shock Improved significantly and patient is almost at his baseline D/c  IV fluids,advance diet as tolerated Follow serial lactic acid levels,lactic acid normalized 1.9 Follow-up on the cultures,blood cultures negative urine culture with the Escherichia coli pansensitive Stool for C. Difficile toxin negative;GI panel is negative given IV antibiotics, neuro checks during hospital course   #Septic shock with abdominal pain and diarrhea from acute infectious colitis and UTI Aggressive hydration with IV fluids provided while patient is in ICU IV antibiotics ciprofloxacin and Flagyl given during the hospital course was discharge with Cipro .  D/C FLAGYL Follow-up with gastroenterology,outpatient  follow-up in 2 weeks after discharge CT abdomen has revealed acute infectious colitis and possible ileus Blood culture no growth in 4 days  #urinary tract infection from Escherichia coli Pansensitive, continue ciprofloxacin -5 DOSES  #Acute renal failure from septic shock Hydrate with IV fluids and monitor renal function and avoid nephrotoxins Creatinine 2.10-1.8-1.77-1.65-1.38  #Diabetes mellitus sliding scale insulin at this time  Physical therapy has recommended skilled nursing facility, but patient prefers going back to go group home with home health, we'll arrange home health and discharge patient   DISCHARGE CONDITIONS:   stable  CONSULTS OBTAINED:  Treatment Team:  Lin Landsman, MD   PROCEDURES   DRUG ALLERGIES:   Allergies  Allergen Reactions  . Lisinopril     Unknown    DISCHARGE MEDICATIONS:   Current Discharge Medication List    START taking these medications   Details  ciprofloxacin (CIPRO) 500 MG tablet Take 1 tablet (500 mg total) by mouth 2 (two) times daily. Qty: 5 tablet, Refills: 0    !! feeding supplement, ENSURE ENLIVE, (ENSURE ENLIVE) LIQD Take 237 mLs by mouth 2 (two) times daily between meals. Qty: 30 Bottle, Refills: 0    !! feeding supplement, GLUCERNA SHAKE, (GLUCERNA SHAKE) LIQD Take 237 mLs by mouth 3 (three) times daily with meals. Qty: 90 Can, Refills: 0     !! - Potential duplicate medications found. Please discuss with provider.    CONTINUE these medications which have NOT CHANGED   Details  acetaminophen (TYLENOL) 325 MG tablet Take 650 mg by mouth every 6 (six) hours as needed.  ARIPiprazole (ABILIFY) 15 MG tablet Take 15 mg by mouth daily.    aspirin EC 81 MG tablet Take 81 mg by mouth daily.    chlorhexidine (PERIDEX) 0.12 % solution Use as directed 15 mLs in the mouth or throat 2 (two) times daily.    clonazePAM (KLONOPIN) 1 MG tablet Take 1 mg by mouth 3 (three) times daily as needed for anxiety. Take 1  mg by mouth every 12 hours as needed and take 1 mg by mouth at bedtime.    gabapentin (NEURONTIN) 300 MG capsule Take 300 mg by mouth 3 (three) times daily.    guaiFENesin-dextromethorphan (ROBITUSSIN DM) 100-10 MG/5ML syrup Take 10 mLs by mouth every 4 (four) hours as needed for cough.    hydrochlorothiazide (HYDRODIURIL) 25 MG tablet Take 25 mg by mouth daily.    hydrocortisone cream 1 % Apply 1 application topically 2 (two) times daily.    loratadine (CLARITIN) 10 MG tablet Take 10 mg by mouth daily.    losartan (COZAAR) 100 MG tablet Take 100 mg by mouth daily.    metFORMIN (GLUCOPHAGE) 500 MG tablet Take 500 mg by mouth 2 (two) times daily with a meal.    metoprolol succinate (TOPROL-XL) 25 MG 24 hr tablet Take 25 mg by mouth daily.    mirtazapine (REMERON SOL-TAB) 15 MG disintegrating tablet Take 15 mg by mouth at bedtime.    ondansetron (ZOFRAN-ODT) 4 MG disintegrating tablet Take 4 mg by mouth every 6 (six) hours as needed for nausea or vomiting.    phenytoin (DILANTIN) 100 MG ER capsule Take 100-300 mg by mouth 2 (two) times daily. Take 100 mg by mouth in the morning and 300 mg by mouth at bedtime.    psyllium (METAMUCIL) 58.6 % powder Take 1 packet by mouth daily.    QUEtiapine (SEROQUEL) 400 MG tablet Take 800 mg by mouth at bedtime.    simvastatin (ZOCOR) 20 MG tablet Take 20 mg by mouth at bedtime.    traZODone (DESYREL) 50 MG tablet Take 50 mg by mouth at bedtime.         DISCHARGE INSTRUCTIONS:   Follow-up with her primary care physician in 2-3 days F/u with GI - IN 2 WEEKS   DIET:  Diabetic diet  DISCHARGE CONDITION:  Fair  ACTIVITY:  Activity as tolerated per PT  OXYGEN:  Home Oxygen: No.   Oxygen Delivery: room air  DISCHARGE LOCATION:  group home   If you experience worsening of your admission symptoms, develop shortness of breath, life threatening emergency, suicidal or homicidal thoughts you must seek medical attention immediately by  calling 911 or calling your MD immediately  if symptoms less severe.  You Must read complete instructions/literature along with all the possible adverse reactions/side effects for all the Medicines you take and that have been prescribed to you. Take any new Medicines after you have completely understood and accpet all the possible adverse reactions/side effects.   Please note  You were cared for by a hospitalist during your hospital stay. If you have any questions about your discharge medications or the care you received while you were in the hospital after you are discharged, you can call the unit and asked to speak with the hospitalist on call if the hospitalist that took care of you is not available. Once you are discharged, your primary care physician will handle any further medical issues. Please note that NO REFILLS for any discharge medications will be authorized once you are discharged, as it  is imperative that you return to your primary care physician (or establish a relationship with a primary care physician if you do not have one) for your aftercare needs so that they can reassess your need for medications and monitor your lab values.     Today  Chief Complaint  Patient presents with  . Code Sepsis   Patient is doing much better. Her diarrhea completely resolved denies any abdominal pain or dizziness. Denies any chest pain or shortness of breath. Prefers going back to group home  ROS: Limited CONSTITUTIONAL: Denies fevers, chills. Denies any fatigue, weakness.  EYES: Denies blurry vision, double vision, eye pain. EARS, NOSE, THROAT: Denies tinnitus, ear pain, hearing loss. RESPIRATORY: Denies cough, wheeze, shortness of breath.  CARDIOVASCULAR: Denies chest pain, palpitations, edema.  GASTROINTESTINAL: Denies nausea, vomiting, diarrhea, abdominal pain. Denies bright red blood per rectum. NEUROLOGIC: Denies paralysis, paresthesias.  PSYCHIATRIC: Denies anxiety or depressive  symptoms.   VITAL SIGNS:  Blood pressure (!) 155/87, pulse (!) 109, temperature (!) 97.1 F (36.2 C), temperature source Oral, resp. rate 20, height 6\' 2"  (1.88 m), weight 128 kg (282 lb 3 oz), SpO2 99 %.  I/O:    Intake/Output Summary (Last 24 hours) at 01/12/17 1514 Last data filed at 01/12/17 1200  Gross per 24 hour  Intake             2495 ml  Output             1700 ml  Net              795 ml    PHYSICAL EXAMINATION:  GENERAL:  56 y.o.-year-old patient lying in the bed with no acute distress.  EYES: Pupils equal, round, reactive to light and accommodation. No scleral icterus. Extraocular muscles intact.  HEENT: Head atraumatic, normocephalic. Oropharynx and nasopharynx clear.  NECK:  Supple, no jugular venous distention. No thyroid enlargement, no tenderness.  LUNGS: Normal breath sounds bilaterally, no wheezing, rales,rhonchi or crepitation. No use of accessory muscles of respiration.  CARDIOVASCULAR: S1, S2 normal. No murmurs, rubs, or gallops.  ABDOMEN: Soft, non-tender, non-distended. Bowel sounds present. No organomegaly or mass.  EXTREMITIES: No pedal edema, cyanosis, or clubbing.  NEUROLOGIC: Cranial nerves II through XII are intact. Muscle strength 5/5 in all extremities. Sensation intact. Gait not checked.  PSYCHIATRIC: The patient is alert and oriented x 2- 3.  SKIN: No obvious rash, lesion, or ulcer.   DATA REVIEW:   CBC  Recent Labs Lab 01/11/17 0556  WBC 7.8  HGB 7.9*  HCT 24.1*  PLT 103*    Chemistries   Recent Labs Lab 01/08/17 2308  01/11/17 0556 01/11/17 1013  NA 138  < > 140 139  K 5.4*  < > 3.9 3.7  CL 111  < > 117* 116*  CO2 20*  < > 19* 17*  GLUCOSE 167*  < > 170* 205*  BUN 32*  < > 24* 25*  CREATININE 2.10*  < > 1.29* 1.38*  CALCIUM 6.8*  < > 7.7* 7.7*  MG 1.9  < > 1.8  --   AST 42*  --   --   --   ALT 25  --   --   --   ALKPHOS 76  --   --   --   BILITOT 1.1  --   --   --   < > = values in this interval not  displayed.  Cardiac Enzymes  Recent Labs Lab 01/08/17 1353  TROPONINI <  0.03    Microbiology Results  Results for orders placed or performed during the hospital encounter of 01/08/17  Blood Culture (routine x 2)     Status: None (Preliminary result)   Collection Time: 01/08/17  1:54 PM  Result Value Ref Range Status   Specimen Description BLOOD RIGHT HAND  Final   Special Requests   Final    BOTTLES DRAWN AEROBIC AND ANAEROBIC Blood Culture results may not be optimal due to an excessive volume of blood received in culture bottles   Culture NO GROWTH 4 DAYS  Final   Report Status PENDING  Incomplete  Blood Culture (routine x 2)     Status: None (Preliminary result)   Collection Time: 01/08/17  1:54 PM  Result Value Ref Range Status   Specimen Description BLOOD LEFT HAND  Final   Special Requests   Final    BOTTLES DRAWN AEROBIC AND ANAEROBIC Blood Culture results may not be optimal due to an excessive volume of blood received in culture bottles   Culture NO GROWTH 4 DAYS  Final   Report Status PENDING  Incomplete  Urine culture     Status: Abnormal   Collection Time: 01/08/17  2:21 PM  Result Value Ref Range Status   Specimen Description URINE, RANDOM  Final   Special Requests NONE  Final   Culture >=100,000 COLONIES/mL ESCHERICHIA COLI (A)  Final   Report Status 01/10/2017 FINAL  Final   Organism ID, Bacteria ESCHERICHIA COLI (A)  Final      Susceptibility   Escherichia coli - MIC*    AMPICILLIN 8 SENSITIVE Sensitive     CEFAZOLIN <=4 SENSITIVE Sensitive     CEFTRIAXONE <=1 SENSITIVE Sensitive     CIPROFLOXACIN <=0.25 SENSITIVE Sensitive     GENTAMICIN <=1 SENSITIVE Sensitive     IMIPENEM <=0.25 SENSITIVE Sensitive     NITROFURANTOIN <=16 SENSITIVE Sensitive     TRIMETH/SULFA <=20 SENSITIVE Sensitive     AMPICILLIN/SULBACTAM 4 SENSITIVE Sensitive     PIP/TAZO <=4 SENSITIVE Sensitive     Extended ESBL NEGATIVE Sensitive     * >=100,000 COLONIES/mL ESCHERICHIA COLI   Gastrointestinal Panel by PCR , Stool     Status: None   Collection Time: 01/08/17  2:22 PM  Result Value Ref Range Status   Campylobacter species NOT DETECTED NOT DETECTED Final   Plesimonas shigelloides NOT DETECTED NOT DETECTED Final   Salmonella species NOT DETECTED NOT DETECTED Final   Yersinia enterocolitica NOT DETECTED NOT DETECTED Final   Vibrio species NOT DETECTED NOT DETECTED Final   Vibrio cholerae NOT DETECTED NOT DETECTED Final   Enteroaggregative E coli (EAEC) NOT DETECTED NOT DETECTED Final   Enteropathogenic E coli (EPEC) NOT DETECTED NOT DETECTED Final   Enterotoxigenic E coli (ETEC) NOT DETECTED NOT DETECTED Final   Shiga like toxin producing E coli (STEC) NOT DETECTED NOT DETECTED Final   Shigella/Enteroinvasive E coli (EIEC) NOT DETECTED NOT DETECTED Final   Cryptosporidium NOT DETECTED NOT DETECTED Final   Cyclospora cayetanensis NOT DETECTED NOT DETECTED Final   Entamoeba histolytica NOT DETECTED NOT DETECTED Final   Giardia lamblia NOT DETECTED NOT DETECTED Final   Adenovirus F40/41 NOT DETECTED NOT DETECTED Final   Astrovirus NOT DETECTED NOT DETECTED Final   Norovirus GI/GII NOT DETECTED NOT DETECTED Final   Rotavirus A NOT DETECTED NOT DETECTED Final   Sapovirus (I, II, IV, and V) NOT DETECTED NOT DETECTED Final  MRSA PCR Screening     Status: None  Collection Time: 01/08/17  6:57 PM  Result Value Ref Range Status   MRSA by PCR NEGATIVE NEGATIVE Final    Comment:        The GeneXpert MRSA Assay (FDA approved for NASAL specimens only), is one component of a comprehensive MRSA colonization surveillance program. It is not intended to diagnose MRSA infection nor to guide or monitor treatment for MRSA infections.   C difficile quick scan w PCR reflex     Status: None   Collection Time: 01/08/17  8:34 PM  Result Value Ref Range Status   C Diff antigen NEGATIVE NEGATIVE Final   C Diff toxin NEGATIVE NEGATIVE Final   C Diff interpretation No C.  difficile detected.  Final  Urine Culture     Status: None   Collection Time: 01/09/17  4:25 PM  Result Value Ref Range Status   Specimen Description URINE, RANDOM  Final   Special Requests NONE  Final   Culture   Final    NO GROWTH Performed at [facility], [address] [place], [place] [address]    Report Status 01/11/2017 FINAL  Final    RADIOLOGY:  Ct Abdomen Pelvis Wo Contrast  Result Date: 01/08/2017 CLINICAL DATA:  Diarrhea.  Hypotension and tachycardia.  Confusion. EXAM: CT ABDOMEN AND PELVIS WITHOUT CONTRAST TECHNIQUE: Multidetector CT imaging of the abdomen and pelvis was performed following the standard protocol without IV contrast. COMPARISON:  None. FINDINGS: Lower chest: Respiratory motion artifact with mild dependent opacity in both lung bases, likely atelectasis. No pleural effusion. Coronary artery atherosclerosis. Normal heart size. Hepatobiliary: No focal liver abnormality is seen. No gallstones, gallbladder wall thickening, or biliary dilatation. Pancreas: Unremarkable. Spleen: Unremarkable. Adrenals/Urinary Tract: Unremarkable adrenal glands. Absent right kidney. Mild left hydronephrosis. Mild-to-moderate diffuse left ureteral dilatation without obstructing stone identified. Decompressed bladder with Foley catheter in place. Stomach/Bowel: The stomach is within normal limits. A rectal tube is in place. A small amount of liquid stool is present at the rectosigmoid region. There is moderate gaseous distension of the sigmoid colon, which is redundant. The sigmoid colon also has a somewhat featureless appearance though there is only at most mild wall thickening. A small amount of formed stool is present in the hepatic and splenic flexures, and there is a small amount of fluid in the descending colon. There is mild gaseous distension of the transverse colon. There is no small bowel dilatation. Vascular/Lymphatic: Normal caliber of the abdominal aorta. No enlarged lymph nodes.  Reproductive: Unremarkable prostate. Other: No intraperitoneal free fluid. Small fat containing umbilical hernia. Musculoskeletal: Thoracolumbar spondylosis. Degenerative endplate irregularity and disc space narrowing at L4-5 and L5-S1 with neural foraminal stenosis at both levels. Grade 1 retrolisthesis of L5 on S1. IMPRESSION: 1. Moderate gaseous distension of the colon suggestive of ileus. Mild rectosigmoid wall thickening may indicate infectious colitis. 2. Mild-to-moderate left hydroureteronephrosis. No obstructing stone identified. Foley catheter in place. 3. Absent right kidney. Electronically Signed   By: Logan Bores M.D.   On: 01/08/2017 18:51   Dg Chest Portable 1 View  Result Date: 01/08/2017 CLINICAL DATA:  Central line placement EXAM: PORTABLE CHEST 1 VIEW COMPARISON:  01/08/2017 FINDINGS: Right central venous catheter tip overlies the right upper chest, presumably over venous confluence, allowing for patient rotation. No pneumothorax. Low lung volumes. Borderline cardiomegaly. IMPRESSION: 1. Right sided central venous catheter tip probably superimposes the venous confluence allowing for rotated patient. No pneumothorax. Electronically Signed   By: Donavan Foil M.D.   On: 01/08/2017  17:58    EKG:   Orders placed or performed during the hospital encounter of 01/08/17  . EKG      Management plans discussed with the patient, family and they are in agreement.  CODE STATUS:     Code Status Orders        Start     Ordered   01/08/17 2010  Full code  Continuous     01/08/17 2010    Code Status History    Date Active Date Inactive Code Status Order ID Comments User Context   This patient has a current code status but no historical code status.      TOTAL TIME TAKING CARE OF THIS PATIENT: 45  minutes.   Note: This dictation was prepared with Dragon dictation along with smaller phrase technology. Any transcriptional errors that result from this process are  unintentional.   @[facility] @  on 01/12/2017 at 3:14 PM  Between 7am to 6pm - Pager - [phone]  After 6pm go to [email or web address] - password [email or web address] [facility]  Office  [phone]  CC: Primary care physician; Patient, No Pcp Per

## 2017-01-12 NOTE — Clinical Social Work Note (Signed)
CSW spoke to Clara [phone] from the group home that patient resides, and they said they can accept patient back with home health PT.  CSW was informed that they use [facility] for home health services, CSW informed case manager, and physician.  Jones Broom. [place], MSW, [place]  01/12/2017 2:48 PM

## 2017-01-12 NOTE — Clinical Social Work Note (Addendum)
CSW attempted to call Clara patient's caregiver [phone], and also called [phone], CSW left message awaiting call back.  CSW continuing to follow patient's progress throughout discharge planning.  5:20pm  [facility] nurse informed CSW that she spoke to [place], patient's caregiver, and they will be at hospital to pick up patient today.  Patient to be d/c'ed today to group home [facility]  Patient agreeable to plans will transport via facility transportation.  Discharge packet has been prepared with signed FL2, and discharge summary.  Jones Broom. [place], MSW, [place]  01/12/2017 5:09 PM

## 2017-01-12 NOTE — Progress Notes (Signed)
Foley removed at 1200pm without problem. IVF d/c'd. Tolerating diet, no diarrhea past 24 hrs. SNF bedsearch in progress.

## 2017-01-12 NOTE — Progress Notes (Signed)
Informed CSW Randall Hiss and Case Manager Hassan Rowan RN Clara from [facility] now would like to evaluate patient before accepting him back.

## 2017-01-12 NOTE — NC FL2 (Signed)
[place] LEVEL OF CARE SCREENING TOOL     IDENTIFICATION  Patient Name: Jason Reed Birthdate: 07/15/1960 Sex: male Admission Date (Current Location): 01/08/2017  Jason Reed:  Jason Reed [id number] Gate and Address:  [facility], [address], [place], [place] [address]      Provider Reed: [id number]  Attending Physician Name and Address:  Jason Mango, MD  Relative Name and Phone Reed:  Jason Reed Other [phone]     Current Level of Care: Hospital Recommended Level of Care: Group Reed Prior Approval Reed:    Date Approved/Denied:   PASRR Reed: [id number]  Discharge Plan: Domiciliary (Rest Reed) Jason Reed and Jason Reed)    Current Diagnoses: Patient Active Problem List   Diagnosis Date Noted  . Septic shock (Jason Reed) 01/08/2017    Orientation RESPIRATION BLADDER Height & Weight     Self, Place  Normal Continent Weight: 282 lb 3 oz (128 kg) Height:  6\' 2"  (188 cm)  BEHAVIORAL SYMPTOMS/MOOD NEUROLOGICAL BOWEL NUTRITION STATUS      Continent Diet (2g Sodium diet)  AMBULATORY STATUS COMMUNICATION OF NEEDS Skin   Supervision Verbally Normal                       Personal Care Assistance Level of Assistance  Bathing, Feeding, Dressing Bathing Assistance: Limited assistance Feeding assistance: Limited assistance Dressing Assistance: Limited assistance     Functional Limitations Info  Sight, Hearing, Speech Sight Info: Adequate Hearing Info: Adequate Speech Info: Adequate    SPECIAL CARE FACTORS FREQUENCY  PT (By licensed PT)     PT Frequency: minimum 2x a week with [facility]              Contractures Contractures Info: Not present    Additional Factors Info  Code Status, Allergies, Psychotropic, Insulin Sliding Scale Code Status Info: Full Code Allergies Info: Lisinopril Psychotropic Info: ARIPiprazole (ABILIFY) tablet 15 mg, QUEtiapine (SEROQUEL)  tablet 800 mg, mirtazapine (REMERON SOL-TAB) disintegrating tablet 15 mg Insulin Sliding Scale Info: insulin aspart (novoLOG) injection 0-9 Units 3x a day with meals       Current Medications (01/12/2017):  This is the current hospital active medication list Current Facility-Administered Medications  Medication Dose Route Frequency Provider Last Rate Last Dose  . acetaminophen (TYLENOL) tablet 650 mg  650 mg Oral Q6H PRN Bettey Costa, MD   650 mg at 01/09/17 0505   Or  . acetaminophen (TYLENOL) suppository 650 mg  650 mg Rectal Q6H PRN Mody, Sital, MD      . ARIPiprazole (ABILIFY) tablet 15 mg  15 mg Oral Daily Bettey Costa, MD   15 mg at 01/12/17 0834  . aspirin EC tablet 81 mg  81 mg Oral Daily Bettey Costa, MD   81 mg at 01/12/17 0833  . ciprofloxacin (CIPRO) tablet 500 mg  500 mg Oral BID Lin Landsman, MD   500 mg at 01/12/17 0834  . clonazePAM (KLONOPIN) tablet 1 mg  1 mg Oral TID PRN Bettey Costa, MD      . enoxaparin (LOVENOX) injection 40 mg  40 mg Subcutaneous Q24H Bettey Costa, MD   40 mg at 01/11/17 2236  . feeding supplement (ENSURE ENLIVE) (ENSURE ENLIVE) liquid 237 mL  237 mL Oral BID BM Vanga, Tally Due, MD      . feeding supplement (GLUCERNA SHAKE) (GLUCERNA SHAKE) liquid 237 mL  237 mL Oral TID BM Vanga, Tally Due, MD   237  mL at 01/12/17 1224  . gabapentin (NEURONTIN) capsule 300 mg  300 mg Oral TID Bettey Costa, MD   300 mg at 01/12/17 0834  . guaiFENesin-dextromethorphan (ROBITUSSIN DM) 100-10 MG/5ML syrup 10 mL  10 mL Oral Q4H PRN Mody, Sital, MD      . ibuprofen (CALDOLOR) 600 mg in sodium chloride 0.9 % 250 mL IVPB  600 mg Intravenous Q6H PRN Mikael Spray, NP   Stopped at 01/09/17 1300  . insulin aspart (novoLOG) injection 0-9 Units  0-9 Units Subcutaneous TID WC Bettey Costa, MD   2 Units at 01/12/17 1224  . loperamide (IMODIUM) capsule 4 mg  4 mg Oral Q6H PRN Lin Landsman, MD   4 mg at 01/09/17 1750  . loratadine (CLARITIN) tablet 10 mg  10 mg Oral  Daily Bettey Costa, MD   10 mg at 01/12/17 0834  . MEDLINE mouth rinse  15 mL Mouth Rinse BID Dorene Sorrow S, NP   15 mL at 01/12/17 0834  . mirtazapine (REMERON SOL-TAB) disintegrating tablet 15 mg  15 mg Oral QHS Bettey Costa, MD   15 mg at 01/11/17 2235  . ondansetron (ZOFRAN) tablet 4 mg  4 mg Oral Q6H PRN Bettey Costa, MD       Or  . ondansetron (ZOFRAN) injection 4 mg  4 mg Intravenous Q6H PRN Mody, Sital, MD      . ondansetron (ZOFRAN-ODT) disintegrating tablet 4 mg  4 mg Oral Q6H PRN Mody, Sital, MD      . phenytoin (DILANTIN) ER capsule 100 mg  100 mg Oral BID Bettey Costa, MD   100 mg at 01/12/17 0833  . QUEtiapine (SEROQUEL) tablet 800 mg  800 mg Oral QHS Bettey Costa, MD   800 mg at 01/11/17 2236  . senna-docusate (Senokot-S) tablet 1 tablet  1 tablet Oral QHS PRN Bettey Costa, MD      . simvastatin (ZOCOR) tablet 20 mg  20 mg Oral QHS Bettey Costa, MD   20 mg at 01/11/17 2236  . sodium chloride flush (NS) 0.9 % injection 10-40 mL  10-40 mL Intracatheter Q12H Tukov, Magadalene S, NP   20 mL at 01/12/17 0835  . sodium chloride flush (NS) 0.9 % injection 10-40 mL  10-40 mL Intracatheter PRN Mikael Spray, NP   12 mL at 01/11/17 2237  . traZODone (DESYREL) tablet 50 mg  50 mg Oral QHS Bettey Costa, MD   50 mg at 01/11/17 2236     Discharge Medications: Please see discharge summary for a list of discharge medications.  Current Discharge Medication List       START taking these medications   Details  ciprofloxacin (CIPRO) 500 MG tablet Take 1 tablet (500 mg total) by mouth 2 (two) times daily. Qty: 5 tablet, Refills: 0    !! feeding supplement, ENSURE ENLIVE, (ENSURE ENLIVE) LIQD Take 237 mLs by mouth 2 (two) times daily between meals. Qty: 30 Bottle, Refills: 0    !! feeding supplement, GLUCERNA SHAKE, (GLUCERNA SHAKE) LIQD Take 237 mLs by mouth 3 (three) times daily with meals. Qty: 90 Can, Refills: 0     !! - Potential duplicate medications found. Please discuss  with provider.       CONTINUE these medications which have NOT CHANGED   Details  acetaminophen (TYLENOL) 325 MG tablet Take 650 mg by mouth every 6 (six) hours as needed.    ARIPiprazole (ABILIFY) 15 MG tablet Take 15 mg by mouth daily.    aspirin  EC 81 MG tablet Take 81 mg by mouth daily.    chlorhexidine (PERIDEX) 0.12 % solution Use as directed 15 mLs in the mouth or throat 2 (two) times daily.    clonazePAM (KLONOPIN) 1 MG tablet Take 1 mg by mouth 3 (three) times daily as needed for anxiety. Take 1 mg by mouth every 12 hours as needed and take 1 mg by mouth at bedtime.    gabapentin (NEURONTIN) 300 MG capsule Take 300 mg by mouth 3 (three) times daily.    guaiFENesin-dextromethorphan (ROBITUSSIN DM) 100-10 MG/5ML syrup Take 10 mLs by mouth every 4 (four) hours as needed for cough.    hydrochlorothiazide (HYDRODIURIL) 25 MG tablet Take 25 mg by mouth daily.    hydrocortisone cream 1 % Apply 1 application topically 2 (two) times daily.    loratadine (CLARITIN) 10 MG tablet Take 10 mg by mouth daily.    losartan (COZAAR) 100 MG tablet Take 100 mg by mouth daily.    metFORMIN (GLUCOPHAGE) 500 MG tablet Take 500 mg by mouth 2 (two) times daily with a meal.    metoprolol succinate (TOPROL-XL) 25 MG 24 hr tablet Take 25 mg by mouth daily.    mirtazapine (REMERON SOL-TAB) 15 MG disintegrating tablet Take 15 mg by mouth at bedtime.    ondansetron (ZOFRAN-ODT) 4 MG disintegrating tablet Take 4 mg by mouth every 6 (six) hours as needed for nausea or vomiting.    phenytoin (DILANTIN) 100 MG ER capsule Take 100-300 mg by mouth 2 (two) times daily. Take 100 mg by mouth in the morning and 300 mg by mouth at bedtime.    psyllium (METAMUCIL) 58.6 % powder Take 1 packet by mouth daily.    QUEtiapine (SEROQUEL) 400 MG tablet Take 800 mg by mouth at bedtime.    simvastatin (ZOCOR) 20 MG tablet Take 20 mg by mouth at bedtime.    traZODone (DESYREL) 50 MG tablet Take  50 mg by mouth at bedtime.       Relevant Imaging Results:  Relevant Lab Results:   Additional Information SSN [id number]  Ross Ludwig, [place]

## 2017-01-12 NOTE — Care Management Note (Signed)
Case Management Note  Patient Details  Name: Jason Reed MRN: [id number] Date of Birth: November 24, 1960  Subjective/Objective:    Admitted to [facility] with the diagnosis of septic shock. A resident of [facility].   Discharge today per Dr. Margaretmary Eddy.                Action/Plan: Physical therapy evaluation completed. Recommending skilled nursing facility. Representative of Group Home states that [name] could come back to their facility with Home Health services in place. Group home uses [facility].   Expected Discharge Date:                  Expected Discharge Plan:     In-House Referral:     Discharge planning Services     Post Acute Care Choice:    Choice offered to:     DME Arranged:    DME Agency:     HH Arranged:   yes HH Agency:   [place]  Status of Service:     If discussed at [place] of Stay Meetings, dates discussed:    Additional Comments:  Shelbie Ammons, [place] [facility] [phone] 01/12/2017, 1:08 PM

## 2017-01-12 NOTE — Progress Notes (Signed)
Pt is stable and ready for discharge. Clara, patient's caregiver, is here to assess patient. Pt walked half way around unit with and without walker with steady gait. Patient to be d/c'ed [facility]. Clara to provide transportation. Discharge packet has been prepared by CSW with signed FL2 and discharge summary. Reviewed new medications with prescriptions attached with Clara who verbalized understanding.

## 2017-01-12 NOTE — Progress Notes (Signed)
[name], MD [address]  [place]  [place], [place] [address]  Main: [phone]  Fax: [phone] Pager: [phone]   Jason Reed is being followed for acute diarrhea Day 1 of follow up    Subjective: Doing well, denies any diarrhea Rectal tube and Foley are removed Alert and awake, solving puzzle when I saw him He denies any abdominal pain, nausea, vomiting Eating regular food  Objective: Vital signs in last 24 hours: Vitals:   01/11/17 1347 01/11/17 1943 01/12/17 0556 01/12/17 1440  BP: 122/72 131/82 (!) 144/81 (!) 155/87  Pulse: (!) 110 (!) 105 (!) 119 (!) 109  Resp:      Temp: 98.8 F (37.1 C) 99 F (37.2 C) 98.6 F (37 C) (!) 97.1 F (36.2 C)  TempSrc: Oral Oral Oral Oral  SpO2: 100% 94% 96% 99%  Weight:      Height:       Weight change:   Intake/Output Summary (Last 24 hours) at 01/12/17 1551 Last data filed at 01/12/17 1200  Gross per 24 hour  Intake             1295 ml  Output             1700 ml  Net             -405 ml     Exam: Heart:: Regular rate and rhythm or S1S2 present Lungs: clear to auscultation Abdomen: soft, nontender, normal bowel sounds   Lab Results: CBC Latest Ref Rng & Units 01/11/2017 01/10/2017 01/09/2017  WBC 3.8 - 10.6 K/uL 7.8 4.0 5.1  Hemoglobin 13.0 - 18.0 g/dL 7.9(L) 7.8(L) 9.3(L)  Hematocrit 40.0 - 52.0 % 24.1(L) 24.3(L) 29.1(L)  Platelets 150 - 440 K/uL 103(L) 77(L) 84(L)   BMP Latest Ref Rng & Units 01/11/2017 01/11/2017 01/10/2017  Glucose 65 - 99 mg/dL 205(H) 170(H) 160(H)  BUN 6 - 20 mg/dL 25(H) 24(H) 23(H)  Creatinine 0.61 - 1.24 mg/dL 1.38(H) 1.29(H) 1.40(H)  Sodium 135 - 145 mmol/L 139 140 138  Potassium 3.5 - 5.1 mmol/L 3.7 3.9 3.9  Chloride 101 - 111 mmol/L 116(H) 117(H) 116(H)  CO2 22 - 32 mmol/L 17(L) 19(L) 17(L)  Calcium 8.9 - 10.3 mg/dL 7.7(L) 7.7(L) 7.5(L)   Hepatic Function Latest Ref Rng & Units 01/08/2017 01/08/2017  Total Protein 6.5 - 8.1 g/dL 5.3(L) 5.9(L)  Albumin 3.5 - 5.0 g/dL  2.6(L) 2.9(L)  AST 15 - 41 U/L 42(H) 35  ALT 17 - 63 U/L 25 20  Alk Phosphatase 38 - 126 U/L 76 90  Total Bilirubin 0.3 - 1.2 mg/dL 1.1 1.1    Micro Results: Recent Results (from the past 240 hour(s))  Blood Culture (routine x 2)     Status: None (Preliminary result)   Collection Time: 01/08/17  1:54 PM  Result Value Ref Range Status   Specimen Description BLOOD RIGHT HAND  Final   Special Requests   Final    BOTTLES DRAWN AEROBIC AND ANAEROBIC Blood Culture results may not be optimal due to an excessive volume of blood received in culture bottles   Culture NO GROWTH 4 DAYS  Final   Report Status PENDING  Incomplete  Blood Culture (routine x 2)     Status: None (Preliminary result)   Collection Time: 01/08/17  1:54 PM  Result Value Ref Range Status   Specimen Description BLOOD LEFT HAND  Final   Special Requests   Final    BOTTLES DRAWN AEROBIC AND ANAEROBIC Blood  Culture results may not be optimal due to an excessive volume of blood received in culture bottles   Culture NO GROWTH 4 DAYS  Final   Report Status PENDING  Incomplete  Urine culture     Status: Abnormal   Collection Time: 01/08/17  2:21 PM  Result Value Ref Range Status   Specimen Description URINE, RANDOM  Final   Special Requests NONE  Final   Culture >=100,000 COLONIES/mL ESCHERICHIA COLI (A)  Final   Report Status 01/10/2017 FINAL  Final   Organism ID, Bacteria ESCHERICHIA COLI (A)  Final      Susceptibility   Escherichia coli - MIC*    AMPICILLIN 8 SENSITIVE Sensitive     CEFAZOLIN <=4 SENSITIVE Sensitive     CEFTRIAXONE <=1 SENSITIVE Sensitive     CIPROFLOXACIN <=0.25 SENSITIVE Sensitive     GENTAMICIN <=1 SENSITIVE Sensitive     IMIPENEM <=0.25 SENSITIVE Sensitive     NITROFURANTOIN <=16 SENSITIVE Sensitive     TRIMETH/SULFA <=20 SENSITIVE Sensitive     AMPICILLIN/SULBACTAM 4 SENSITIVE Sensitive     PIP/TAZO <=4 SENSITIVE Sensitive     Extended ESBL NEGATIVE Sensitive     * >=100,000 COLONIES/mL  ESCHERICHIA COLI  Gastrointestinal Panel by PCR , Stool     Status: None   Collection Time: 01/08/17  2:22 PM  Result Value Ref Range Status   Campylobacter species NOT DETECTED NOT DETECTED Final   Plesimonas shigelloides NOT DETECTED NOT DETECTED Final   Salmonella species NOT DETECTED NOT DETECTED Final   Yersinia enterocolitica NOT DETECTED NOT DETECTED Final   Vibrio species NOT DETECTED NOT DETECTED Final   Vibrio cholerae NOT DETECTED NOT DETECTED Final   Enteroaggregative E coli (EAEC) NOT DETECTED NOT DETECTED Final   Enteropathogenic E coli (EPEC) NOT DETECTED NOT DETECTED Final   Enterotoxigenic E coli (ETEC) NOT DETECTED NOT DETECTED Final   Shiga like toxin producing E coli (STEC) NOT DETECTED NOT DETECTED Final   Shigella/Enteroinvasive E coli (EIEC) NOT DETECTED NOT DETECTED Final   Cryptosporidium NOT DETECTED NOT DETECTED Final   Cyclospora cayetanensis NOT DETECTED NOT DETECTED Final   Entamoeba histolytica NOT DETECTED NOT DETECTED Final   Giardia lamblia NOT DETECTED NOT DETECTED Final   Adenovirus F40/41 NOT DETECTED NOT DETECTED Final   Astrovirus NOT DETECTED NOT DETECTED Final   Norovirus GI/GII NOT DETECTED NOT DETECTED Final   Rotavirus A NOT DETECTED NOT DETECTED Final   Sapovirus (I, II, IV, and V) NOT DETECTED NOT DETECTED Final  MRSA PCR Screening     Status: None   Collection Time: 01/08/17  6:57 PM  Result Value Ref Range Status   MRSA by PCR NEGATIVE NEGATIVE Final    Comment:        The GeneXpert MRSA Assay (FDA approved for NASAL specimens only), is one component of a comprehensive MRSA colonization surveillance program. It is not intended to diagnose MRSA infection nor to guide or monitor treatment for MRSA infections.   C difficile quick scan w PCR reflex     Status: None   Collection Time: 01/08/17  8:34 PM  Result Value Ref Range Status   C Diff antigen NEGATIVE NEGATIVE Final   C Diff toxin NEGATIVE NEGATIVE Final   C Diff  interpretation No C. difficile detected.  Final  Urine Culture     Status: None   Collection Time: 01/09/17  4:25 PM  Result Value Ref Range Status   Specimen Description URINE, RANDOM  Final  Special Requests NONE  Final   Culture   Final    NO GROWTH Performed at [facility], [place] [address], [place], [place] [address]    Report Status 01/11/2017 FINAL  Final   Studies/Results: No results found. Medications: I have reviewed the patient's current medications. Scheduled Meds: . ARIPiprazole  15 mg Oral Daily  . aspirin EC  81 mg Oral Daily  . ciprofloxacin  500 mg Oral BID  . enoxaparin (LOVENOX) injection  40 mg Subcutaneous Q24H  . feeding supplement (ENSURE ENLIVE)  237 mL Oral BID BM  . feeding supplement (GLUCERNA SHAKE)  237 mL Oral TID BM  . gabapentin  300 mg Oral TID  . insulin aspart  0-9 Units Subcutaneous TID WC  . loratadine  10 mg Oral Daily  . mouth rinse  15 mL Mouth Rinse BID  . mirtazapine  15 mg Oral QHS  . phenytoin  100 mg Oral BID  . QUEtiapine  800 mg Oral QHS  . simvastatin  20 mg Oral QHS  . sodium chloride flush  10-40 mL Intracatheter Q12H  . traZODone  50 mg Oral QHS   Continuous Infusions: . ibuprofen (CALDOLOR) IV Stopped (01/09/17 1300)   PRN Meds:.acetaminophen **OR** acetaminophen, clonazePAM, guaiFENesin-dextromethorphan, ibuprofen (CALDOLOR) IV, loperamide, ondansetron **OR** ondansetron (ZOFRAN) IV, ondansetron, senna-docusate, sodium chloride flush   Assessment: Active Problems:   Septic shock (HCC)  Akin Yi is a 56 y.o. y/o male with MR, diabetes, hypertension with acute febrile nonbloody diarrhea leading to severe hypovolemia and and AKI. Most likely infectious diarrhea. However, GI pathogen panel for various bacterial and viral infections and C. Difficile have been negative. HIV negative. He also developed mild pancytopenia most likely secondary to aggressive fluid resuscitation Which is improving. Diarrhea is also  improving. Clinically getting better. Serum cortisol levels normal. TSH normal. Urine drug screen negative. He continues to have normocytic anemia, there is no evidence of active GI bleed Fecal lactoferrin positive. Elevated sedimentation rate. Creatinine is significantly improved. Afebrile. He received azithromycin 1 g  Plan: Continue regular food Check labs today Monitor electrolytes and replete as necessary Serum ferritin, iron studies, B12 and folate acid normal Continue ciprofloxacin for 5 days total  Continue rifaximin for 3 days total  Anticipate DC to acute rehab today ortomorrow GI clinic follow-up in 2-4 weeks after discharge    LOS: 4 days   Kayson Bullis 01/12/2017, 3:51 PM

## 2017-01-13 LAB — CULTURE, BLOOD (ROUTINE X 2)
CULTURE: NO GROWTH
Culture: NO GROWTH

## 2017-01-13 LAB — FOLATE RBC
Folate, Hemolysate: 397.5 ng/mL
Folate, RBC: 1440 ng/mL (ref >498)
HEMATOCRIT: 27.6 % — AB (ref 37.5–51.0)

## 2017-01-22 NOTE — Progress Notes (Signed)
[facility]  Patient Status: SN arrived to pt's home with no answer to the door. SN called paid Cg Claira of the group home and states pt is at her other group home down the road and she will come down and meet sn and bring her to the home where pt is located. Once at the group home, sn explained to cg the services that [facility] will provide, she states she would like PT but she has another client that goes to outpt PT and she feels that would be better and pt goes to a day program Mon thru Thurs and doesnt get home until 4pm and she doesnt want him pulled out of the program to be home for home visits.  SN instructed paid cg to follow up with Dr. Lennox Grumbles for any problems or concerns and states pt went to MD's office on 01/14/17. SN sent Dr. Lennox Grumbles a PCU regarding [facility] not admitting pt per paid cg's request. Notified Jeannie Fend, [place], RNCM.   Florene Glen 01/22/2017, 12:07 PM

## 2018-10-19 ENCOUNTER — Encounter: Payer: Self-pay | Admitting: Family Medicine

## 2018-10-29 ENCOUNTER — Other Ambulatory Visit: Payer: Self-pay | Admitting: Family Medicine

## 2018-10-29 DIAGNOSIS — Z20822 Contact with and (suspected) exposure to covid-19: Secondary | ICD-10-CM

## 2018-11-04 LAB — NOVEL CORONAVIRUS, NAA: SARS-CoV-2, NAA: NOT DETECTED

## 2018-11-07 ENCOUNTER — Telehealth: Payer: Self-pay | Admitting: *Deleted

## 2018-11-07 NOTE — Telephone Encounter (Signed)
Delana Meyer- nurse at [facility] for patient results.  Patient is with her and each patient identified his/herself with name and birthday and was given direct results. negative for COVID .

## 2018-11-11 ENCOUNTER — Telehealth: Payer: Self-pay | Admitting: Gastroenterology

## 2018-11-11 NOTE — Telephone Encounter (Signed)
Clara from the [facility]  [facility] called back to schedule patient's colonoscopy.

## 2018-11-14 NOTE — Telephone Encounter (Signed)
Patient has a referral for screening colonoscopy.  Returned Clara's  call to schedule colonoscopy for patient.  Clara informed me that this is not the first attempt for pt to have colonoscopy.  She states that patient lives in a group home.  He is mentally challenged, has had at least a 15 pound weight loss.  His brother has been recently diagnosed with some form of cancer.  His sister was diagnosed with cancer as well.  She is unsure what kind of cancer the siblings had but plans to check with a family member and get back to me.  Patient really needs to have a colonoscopy.  What is the best way for him to prepare for a colonoscopy. Clara stated that she suppose she could use a bed pan, because he is may not be able to get to the toilet right away.  I suggested bedside commode and she said they could get one from another facility.  Do you have any other suggestions.  Please advise,  Thanks Sharyn Lull

## 2018-11-21 ENCOUNTER — Telehealth: Payer: Self-pay | Admitting: Gastroenterology

## 2018-11-21 NOTE — Telephone Encounter (Signed)
error 

## 2018-11-22 ENCOUNTER — Other Ambulatory Visit: Payer: Self-pay

## 2018-11-22 ENCOUNTER — Ambulatory Visit (INDEPENDENT_AMBULATORY_CARE_PROVIDER_SITE_OTHER): Payer: Medicare Other | Admitting: Gastroenterology

## 2018-11-22 VITALS — BP 121/85 | HR 99 | Temp 99.2°F | Ht 74.0 in | Wt 263.4 lb

## 2018-11-22 DIAGNOSIS — R634 Abnormal weight loss: Secondary | ICD-10-CM

## 2018-11-22 DIAGNOSIS — Z1211 Encounter for screening for malignant neoplasm of colon: Secondary | ICD-10-CM

## 2018-11-22 NOTE — Progress Notes (Signed)
Jason Reed [address]  [place]  [place], [place] [address]  Main: [phone]  Fax: [phone]   Gastroenterology Consultation  Referring Provider:     Marguerita Merles, MD Primary Care Physician:  Marguerita Merles, MD Reason for Consultation:     CRC screening, weight loss        HPI:    Chief Complaint  Patient presents with  . Colon Cancer Screening    Jason Reed is a 58 y.o. y/o male referred for consultation & management  by Dr. Lennox Grumbles, Connye Burkitt, MD.  Patient who is mentally challenged and lives in a group home was referred for [facility] screening.  Group home staff who drives the patient around is with him but no other staff is with him.  Patient is able to answer questions.  States he has 1 or 2 bowel formed movements a day and is able to go to the bathroom without any difficulty.  Does not wear diapers and does not need help going to the bathroom.  Denies any diarrhea.  Does not know of any blood in his stool.  No family history of colon cancer.  No prior colonoscopies.  PCP note states FOBT was negative in 2018.  Patient's weight was 282 pounds in September 2018 and is 263 pounds on November 22, 2018  The patient denies abdominal or flank pain, anorexia, nausea or vomiting, dysphagia, change in bowel habits or black or bloody stools or weight loss.   Past Medical History:  Diagnosis Date  . Diabetes ([place])   . HTN (hypertension)     No past surgical history on file.  Prior to Admission medications   Medication Sig Start Date End Date Taking? Authorizing Provider  ARIPiprazole (ABILIFY) 15 MG tablet Take 15 mg by mouth daily.   Yes [provider]  aspirin EC 81 MG tablet Take 81 mg by mouth daily.   Yes [provider]  clonazePAM (KLONOPIN) 1 MG tablet Take 1 mg by mouth 3 (three) times daily as needed for anxiety. Take 1 mg by mouth every 12 hours as needed and take 1 mg by mouth at bedtime.   Yes [provider]  gabapentin  (NEURONTIN) 300 MG capsule Take 300 mg by mouth 3 (three) times daily.   Yes [provider]  hydrochlorothiazide (HYDRODIURIL) 25 MG tablet Take 25 mg by mouth daily.   Yes [provider]  loratadine (CLARITIN) 10 MG tablet Take 10 mg by mouth daily.   Yes [provider]  losartan (COZAAR) 100 MG tablet Take 100 mg by mouth daily.   Yes [provider]  metFORMIN (GLUCOPHAGE) 500 MG tablet Take 500 mg by mouth 2 (two) times daily with a meal.   Yes [provider]  metoprolol succinate (TOPROL-XL) 25 MG 24 hr tablet Take 25 mg by mouth daily.   Yes [provider]  mirtazapine (REMERON SOL-TAB) 15 MG disintegrating tablet Take 15 mg by mouth at bedtime.   Yes [provider]  phenytoin (DILANTIN) 100 MG ER capsule Take 100-300 mg by mouth 2 (two) times daily. Take 100 mg by mouth in the morning and 300 mg by mouth at bedtime.   Yes [provider]  QUEtiapine (SEROQUEL) 400 MG tablet Take 800 mg by mouth at bedtime.   Yes [provider]  simvastatin (ZOCOR) 20 MG tablet Take 20 mg by mouth at bedtime.   Yes [provider]  traZODone (DESYREL) 50 MG  tablet Take 50 mg by mouth at bedtime.   Yes [provider]  acetaminophen (TYLENOL) 325 MG tablet Take 650 mg by mouth every 6 (six) hours as needed.    [provider]  chlorhexidine (PERIDEX) 0.12 % solution Use as directed 15 mLs in the mouth or throat 2 (two) times daily.    [provider]  ciprofloxacin (CIPRO) 500 MG tablet Take 1 tablet (500 mg total) by mouth 2 (two) times daily. Patient not taking: Reported on 11/22/2018 01/12/17   Nicholes Mango, MD  feeding supplement, ENSURE ENLIVE, (ENSURE ENLIVE) LIQD Take 237 mLs by mouth 2 (two) times daily between meals. 01/13/17   Gouru, Illene Silver, MD  feeding supplement, GLUCERNA SHAKE, (GLUCERNA SHAKE) LIQD Take 237 mLs by mouth 3 (three) times daily with meals. 01/12/17   Nicholes Mango, MD   gabapentin (NEURONTIN) 600 MG tablet  10/25/18   [provider]  guaiFENesin-dextromethorphan (ROBITUSSIN DM) 100-10 MG/5ML syrup Take 10 mLs by mouth every 4 (four) hours as needed for cough.    [provider]  hydrocortisone cream 1 % Apply 1 application topically 2 (two) times daily.    [provider]  ondansetron (ZOFRAN-ODT) 4 MG disintegrating tablet Take 4 mg by mouth every 6 (six) hours as needed for nausea or vomiting.    [provider]  psyllium (METAMUCIL) 58.6 % powder Take 1 packet by mouth daily.    [provider]    No family history on file.   Social History   Tobacco Use  . Smoking status: Never Smoker  Substance Use Topics  . Alcohol use: No  . Drug use: Not on file    Allergies as of 11/22/2018 - Review Complete 11/22/2018  Allergen Reaction Noted  . Lisinopril  01/08/2017    Review of Systems:    All systems reviewed and negative except where noted in HPI.   Physical Exam:  BP 121/85   Pulse 99   Temp 99.2 F (37.3 C)   Ht 6\' 2"  (1.88 m)   Wt 263 lb 6.4 oz (119.5 kg)   BMI 33.82 kg/m  No LMP for male patient. Psych:  Alert and cooperative. Normal mood and affect. General:   Alert,  Well-developed, well-nourished, pleasant and cooperative in NAD Head:  Normocephalic and atraumatic. Ears:  Normal auditory acuity. Nose:  No deformity, discharge, or lesions. Mouth:  No deformity or lesions,oropharynx pink & moist. Neck:  Supple; no masses or thyromegaly. Abdomen:  Normal bowel sounds.  No bruits.  Soft, non-tender and non-distended without masses, hepatosplenomegaly or hernias noted.  No guarding or rebound tenderness.    Msk:  Symmetrical without gross deformities. Good, equal movement & strength bilaterally. Pulses:  Normal pulses noted. Extremities:  No clubbing or edema.  No cyanosis. Neurologic:  Alert and oriented x3;  grossly normal neurologically. Skin:  Intact without significant lesions or  rashes. No jaundice. Lymph Nodes:  No significant cervical adenopathy. Psych:  Alert and cooperative. Normal mood and affect.   Labs: CBC    Component Value Date/Time   WBC 7.8 01/11/2017 0556   RBC 2.59 (L) 01/11/2017 0556   HGB 7.9 (L) 01/11/2017 0556   HCT 27.6 (L) 01/11/2017 1013   PLT 103 (L) 01/11/2017 0556   MCV 92.9 01/11/2017 0556   MCH 30.5 01/11/2017 0556   MCHC 32.8 01/11/2017 0556   RDW 15.7 (H) 01/11/2017 0556   LYMPHSABS 0.5 (L) 01/08/2017 1353   MONOABS 1.2 (H) 01/08/2017 1353  EOSABS 0.0 01/08/2017 1353   BASOSABS 0.1 01/08/2017 1353   CMP     Component Value Date/Time   NA 139 01/11/2017 1013   K 3.7 01/11/2017 1013   CL 116 (H) 01/11/2017 1013   CO2 17 (L) 01/11/2017 1013   GLUCOSE 205 (H) 01/11/2017 1013   BUN 25 (H) 01/11/2017 1013   CREATININE 1.38 (H) 01/11/2017 1013   CALCIUM 7.7 (L) 01/11/2017 1013   PROT 5.3 (L) 01/08/2017 2308   ALBUMIN 2.6 (L) 01/08/2017 2308   AST 42 (H) 01/08/2017 2308   ALT 25 01/08/2017 2308   ALKPHOS 76 01/08/2017 2308   BILITOT 1.1 01/08/2017 2308   GFRNONAA 55 (L) 01/11/2017 1013   GFRAA >60 01/11/2017 1013    Imaging Studies: No results found.  Assessment and Plan:   Jason Reed is a 58 y.o. y/o male has been referred for colorectal cancer screening and weight loss  Given his weight loss will also schedule for EGD and colonoscopy at the same time  Patient states he is able to make it to the bathroom without any difficulty  we discussed risks and benefits of procedures in detail and he is agreeable to the plan  If patient has difficulty making it to the bathroom due to the diarrhea at that will occur after starting the colonoscopy prep, group home staff can assist him with bedpan's or bedside commode's as necessary  Group home staff was also notified that a staff member will need to be with him for the duration of the procedures  I have discussed alternative options, risks & benefits,  which include,  but are not limited to, bleeding, infection, perforation,respiratory complication & drug reaction.  The patient agrees with this plan & written consent will be obtained.     Dr Jason Reed  Speech recognition software was used to dictate the above note.

## 2018-12-16 ENCOUNTER — Other Ambulatory Visit
Admission: RE | Admit: 2018-12-16 | Discharge: 2018-12-16 | Disposition: A | Discharge status: Home / Self Care | Payer: Medicare Other | Source: Ambulatory Visit | Attending: Gastroenterology | Admitting: Gastroenterology

## 2018-12-16 ENCOUNTER — Other Ambulatory Visit: Payer: Self-pay

## 2018-12-16 DIAGNOSIS — Z20828 Contact with and (suspected) exposure to other viral communicable diseases: Secondary | ICD-10-CM | POA: Diagnosis not present

## 2018-12-16 DIAGNOSIS — Z01812 Encounter for preprocedural laboratory examination: Secondary | ICD-10-CM | POA: Diagnosis present

## 2018-12-16 LAB — SARS CORONAVIRUS 2 (TAT 6-24 HRS): SARS Coronavirus 2: NEGATIVE

## 2018-12-19 ENCOUNTER — Other Ambulatory Visit: Payer: Medicare Other

## 2018-12-21 ENCOUNTER — Encounter: Payer: Self-pay | Admitting: *Deleted

## 2018-12-22 ENCOUNTER — Encounter: Payer: Self-pay | Admitting: Anesthesiology

## 2018-12-22 ENCOUNTER — Other Ambulatory Visit: Payer: Self-pay

## 2018-12-22 ENCOUNTER — Ambulatory Visit: Payer: Medicare Other | Admitting: Registered Nurse

## 2018-12-22 ENCOUNTER — Encounter
Admission: RE | Disposition: A | Discharge status: Home / Self Care | Payer: Self-pay | Source: Home / Self Care | Attending: Gastroenterology

## 2018-12-22 ENCOUNTER — Ambulatory Visit
Admission: RE | Admit: 2018-12-22 | Discharge: 2018-12-22 | Disposition: A | Discharge status: Home / Self Care | Payer: Medicare Other | Attending: Gastroenterology | Admitting: Gastroenterology

## 2018-12-22 DIAGNOSIS — R634 Abnormal weight loss: Secondary | ICD-10-CM | POA: Insufficient documentation

## 2018-12-22 DIAGNOSIS — K259 Gastric ulcer, unspecified as acute or chronic, without hemorrhage or perforation: Secondary | ICD-10-CM | POA: Insufficient documentation

## 2018-12-22 DIAGNOSIS — K3189 Other diseases of stomach and duodenum: Secondary | ICD-10-CM | POA: Diagnosis not present

## 2018-12-22 DIAGNOSIS — Z79899 Other long term (current) drug therapy: Secondary | ICD-10-CM | POA: Diagnosis not present

## 2018-12-22 DIAGNOSIS — Q402 Other specified congenital malformations of stomach: Secondary | ICD-10-CM

## 2018-12-22 DIAGNOSIS — I10 Essential (primary) hypertension: Secondary | ICD-10-CM | POA: Insufficient documentation

## 2018-12-22 DIAGNOSIS — K2950 Unspecified chronic gastritis without bleeding: Secondary | ICD-10-CM | POA: Diagnosis not present

## 2018-12-22 DIAGNOSIS — Z1211 Encounter for screening for malignant neoplasm of colon: Secondary | ICD-10-CM | POA: Diagnosis present

## 2018-12-22 DIAGNOSIS — Z6833 Body mass index (BMI) 33.0-33.9, adult: Secondary | ICD-10-CM | POA: Insufficient documentation

## 2018-12-22 DIAGNOSIS — Z7982 Long term (current) use of aspirin: Secondary | ICD-10-CM | POA: Insufficient documentation

## 2018-12-22 DIAGNOSIS — E119 Type 2 diabetes mellitus without complications: Secondary | ICD-10-CM | POA: Diagnosis not present

## 2018-12-22 DIAGNOSIS — Z7984 Long term (current) use of oral hypoglycemic drugs: Secondary | ICD-10-CM | POA: Insufficient documentation

## 2018-12-22 DIAGNOSIS — K6389 Other specified diseases of intestine: Secondary | ICD-10-CM | POA: Diagnosis not present

## 2018-12-22 HISTORY — DX: Unspecified psychosis not due to a substance or known physiological condition: F29

## 2018-12-22 HISTORY — PX: ESOPHAGOGASTRODUODENOSCOPY (EGD) WITH PROPOFOL: SHX5813

## 2018-12-22 HISTORY — PX: COLONOSCOPY WITH PROPOFOL: SHX5780

## 2018-12-22 HISTORY — DX: Hyperlipidemia, unspecified: E78.5

## 2018-12-22 HISTORY — DX: Unspecified convulsions: R56.9

## 2018-12-22 LAB — GLUCOSE, CAPILLARY: Glucose-Capillary: 130 mg/dL — ABNORMAL HIGH (ref 70–99)

## 2018-12-22 SURGERY — COLONOSCOPY WITH PROPOFOL
Anesthesia: General

## 2018-12-22 MED ORDER — PROPOFOL 500 MG/50ML IV EMUL
INTRAVENOUS | Status: DC | PRN
  Administered 2018-12-22: 150 ug/kg/min via INTRAVENOUS

## 2018-12-22 MED ORDER — LIDOCAINE HCL (CARDIAC) PF 100 MG/5ML IV SOSY
PREFILLED_SYRINGE | INTRAVENOUS | Status: DC | PRN
  Administered 2018-12-22: 40 mg via INTRAVENOUS

## 2018-12-22 MED ORDER — PROPOFOL 10 MG/ML IV BOLUS
INTRAVENOUS | Status: DC | PRN
  Administered 2018-12-22: 100 mg via INTRAVENOUS

## 2018-12-22 MED ORDER — SODIUM CHLORIDE 0.9 % IV SOLN
INTRAVENOUS | Status: DC
  Administered 2018-12-22 (×2): via INTRAVENOUS

## 2018-12-22 MED ORDER — GLYCOPYRROLATE 0.2 MG/ML IJ SOLN
INTRAMUSCULAR | Status: AC
  Filled 2018-12-22: qty 1

## 2018-12-22 MED ORDER — PHENYLEPHRINE HCL (PRESSORS) 10 MG/ML IV SOLN
INTRAVENOUS | Status: DC | PRN
  Administered 2018-12-22 (×2): 100 ug via INTRAVENOUS
  Administered 2018-12-22 (×2): 200 ug via INTRAVENOUS
  Administered 2018-12-22 (×2): 100 ug via INTRAVENOUS

## 2018-12-22 MED ORDER — GLYCOPYRROLATE 0.2 MG/ML IJ SOLN
INTRAMUSCULAR | Status: DC | PRN
  Administered 2018-12-22: 0.2 mg via INTRAVENOUS

## 2018-12-22 MED ORDER — PROPOFOL 500 MG/50ML IV EMUL
INTRAVENOUS | Status: AC
  Filled 2018-12-22: qty 50

## 2018-12-22 MED ORDER — NA SULFATE-K SULFATE-MG SULF 17.5-3.13-1.6 GM/177ML PO SOLN: 1.0000 | Freq: Once | ORAL | 0 refills | Status: DC

## 2018-12-22 MED ORDER — PROPOFOL 10 MG/ML IV BOLUS
INTRAVENOUS | Status: AC
  Filled 2018-12-22: qty 20

## 2018-12-22 MED ORDER — NA SULFATE-K SULFATE-MG SULF 17.5-3.13-1.6 GM/177ML PO SOLN: 1.0000 | Freq: Once | ORAL | 0 refills | Status: AC

## 2018-12-22 MED ORDER — MIDAZOLAM HCL 2 MG/2ML IJ SOLN
INTRAMUSCULAR | Status: AC
  Filled 2018-12-22: qty 2

## 2018-12-22 NOTE — OR Nursing (Signed)
Waiting on pt's care giver Tawana Scale to arrive to give instructions.

## 2018-12-22 NOTE — Anesthesia Preprocedure Evaluation (Addendum)
Anesthesia Evaluation  Patient identified by MRN, date of birth, ID band Patient awake    Reviewed: Allergy & Precautions, NPO status , Patient's Chart, lab work & pertinent test results  Airway Mallampati: II       Dental   Pulmonary neg pulmonary ROS,    Pulmonary exam normal        Cardiovascular hypertension, Normal cardiovascular exam     Neuro/Psych Developmental delaynegative neurological ROS  negative psych ROS   GI/Hepatic Neg liver ROS,   Endo/Other  diabetes  Renal/GU negative Renal ROS  negative genitourinary   Musculoskeletal negative musculoskeletal ROS (+)   Abdominal Normal abdominal exam  (+)   Peds negative pediatric ROS (+)  Hematology negative hematology ROS (+)   Anesthesia Other Findings   Reproductive/Obstetrics                            Anesthesia Physical Anesthesia Plan  ASA: II  Anesthesia Plan: General   Post-op Pain Management:    Induction: Intravenous  PONV Risk Score and Plan:   Airway Management Planned: Nasal Cannula  Additional Equipment:   Intra-op Plan:   Post-operative Plan:   Informed Consent: I have reviewed the patients History and Physical, chart, labs and discussed the procedure including the risks, benefits and alternatives for the proposed anesthesia with the patient or authorized representative who has indicated his/her understanding and acceptance.     Dental advisory given  Plan Discussed with: CRNA and Surgeon  Anesthesia Plan Comments:         Anesthesia Quick Evaluation

## 2018-12-22 NOTE — H&P (Signed)
Vonda Antigua, MD [address], [place], [place], [place], [address] [address], [place], [place], [place], [address] Phone: [phone]  Fax: [phone]  Primary Care Physician:  Marguerita Merles, MD   Pre-Procedure History & Physical: HPI:  Jason Reed is a 58 y.o. male is here for a colonoscopy and EGD.   Past Medical History:  Diagnosis Date  . Diabetes ([place])   . HTN (hypertension)     History reviewed. No pertinent surgical history.  Prior to Admission medications   Medication Sig Start Date End Date Taking? Authorizing Provider  acetaminophen (TYLENOL) 325 MG tablet Take 650 mg by mouth every 6 (six) hours as needed.    [provider]  ARIPiprazole (ABILIFY) 15 MG tablet Take 15 mg by mouth daily.    [provider]  aspirin EC 81 MG tablet Take 81 mg by mouth daily.    [provider]  chlorhexidine (PERIDEX) 0.12 % solution Use as directed 15 mLs in the mouth or throat 2 (two) times daily.    [provider]  ciprofloxacin (CIPRO) 500 MG tablet Take 1 tablet (500 mg total) by mouth 2 (two) times daily. Patient not taking: Reported on 11/22/2018 01/12/17   Nicholes Mango, MD  clonazePAM (KLONOPIN) 1 MG tablet Take 1 mg by mouth 3 (three) times daily as needed for anxiety. Take 1 mg by mouth every 12 hours as needed and take 1 mg by mouth at bedtime.    [provider]  feeding supplement, ENSURE ENLIVE, (ENSURE ENLIVE) LIQD Take 237 mLs by mouth 2 (two) times daily between meals. 01/13/17   Gouru, Illene Silver, MD  feeding supplement, GLUCERNA SHAKE, (GLUCERNA SHAKE) LIQD Take 237 mLs by mouth 3 (three) times daily with meals. 01/12/17   Gouru, Illene Silver, MD  gabapentin (NEURONTIN) 300 MG capsule Take 300 mg by mouth 3 (three) times daily.    [provider]  gabapentin (NEURONTIN) 600 MG tablet  10/25/18   [provider]  guaiFENesin-dextromethorphan (ROBITUSSIN DM) 100-10 MG/5ML syrup Take 10 mLs by mouth every 4  (four) hours as needed for cough.    [provider]  hydrochlorothiazide (HYDRODIURIL) 25 MG tablet Take 25 mg by mouth daily.    [provider]  hydrocortisone cream 1 % Apply 1 application topically 2 (two) times daily.    [provider]  loratadine (CLARITIN) 10 MG tablet Take 10 mg by mouth daily.    [provider]  losartan (COZAAR) 100 MG tablet Take 100 mg by mouth daily.    [provider]  metFORMIN (GLUCOPHAGE) 500 MG tablet Take 500 mg by mouth 2 (two) times daily with a meal.    [provider]  metoprolol succinate (TOPROL-XL) 25 MG 24 hr tablet Take 25 mg by mouth daily.    [provider]  mirtazapine (REMERON SOL-TAB) 15 MG disintegrating tablet Take 15 mg by mouth at bedtime.    [provider]  ondansetron (ZOFRAN-ODT) 4 MG disintegrating tablet Take 4 mg by mouth every 6 (six) hours as needed for nausea or vomiting.    [provider]  phenytoin (DILANTIN) 100 MG ER capsule Take 100-300 mg by mouth 2 (two) times daily. Take 100 mg by mouth in the morning and 300 mg by mouth at bedtime.    [provider]  psyllium (METAMUCIL) 58.6 % powder Take 1 packet by mouth daily.    [provider]  QUEtiapine (SEROQUEL) 400 MG tablet Take 800 mg by  mouth at bedtime.    [provider]  simvastatin (ZOCOR) 20 MG tablet Take 20 mg by mouth at bedtime.    [provider]  traZODone (DESYREL) 50 MG tablet Take 50 mg by mouth at bedtime.    [provider]    Allergies as of 11/28/2018 - Review Complete 11/22/2018  Allergen Reaction Noted  . Lisinopril  01/08/2017    History reviewed. No pertinent family history.  Social History   Socioeconomic History  . Marital status: Single    Spouse name: Not on file  . Number of children: Not on file  . Years of education: Not on file  . Highest education level: Not on file  Occupational History  . Not on file   Social Needs  . Financial resource strain: Not on file  . Food insecurity    Worry: Not on file    Inability: Not on file  . Transportation needs    Medical: Not on file    Non-medical: Not on file  Tobacco Use  . Smoking status: Never Smoker  Substance and Sexual Activity  . Alcohol use: No  . Drug use: Not on file  . Sexual activity: Not on file  Lifestyle  . Physical activity    Days per week: Not on file    Minutes per session: Not on file  . Stress: Not on file  Relationships  . Social Herbalist on phone: Not on file    Gets together: Not on file    Attends religious service: Not on file    Active member of club or organization: Not on file    Attends meetings of clubs or organizations: Not on file    Relationship status: Not on file  . Intimate partner violence    Fear of current or ex partner: Not on file    Emotionally abused: Not on file    Physically abused: Not on file    Forced sexual activity: Not on file  Other Topics Concern  . Not on file  Social History Narrative  . Not on file    Review of Systems: See HPI, otherwise negative ROS  Physical Exam: There were no vitals taken for this visit. General:   Alert,  pleasant and cooperative in NAD Head:  Normocephalic and atraumatic. Neck:  Supple; no masses or thyromegaly. Lungs:  Clear throughout to auscultation, normal respiratory effort.    Heart:  +S1, +S2, Regular rate and rhythm, No edema. Abdomen:  Soft, nontender and nondistended. Normal bowel sounds, without guarding, and without rebound.   Neurologic:  Alert and  oriented x4;  grossly normal neurologically.  Impression/Plan: Jason Reed is here for a colonoscopy to be performed for average risk screening and EGD for weight loss  Risks, benefits, limitations, and alternatives regarding the procedures have been reviewed with the patient.  Questions have been answered.  All parties agreeable.   Virgel Manifold, MD   12/22/2018, 9:53 AM

## 2018-12-22 NOTE — Op Note (Signed)
[facility] Gastroenterology Patient Name: Jason Reed Procedure Date: 12/22/2018 10:04 AM MRN: [id number] Account #: [id number] Date of Birth: 1961-03-04 Admit Type: Outpatient Age: 58 Room: [facility] ENDO ROOM 3 Gender: Male Note Status: Finalized Procedure:            Colonoscopy Indications:          Screening for colorectal malignant neoplasm Providers:            Kearstyn Avitia B. Bonna Gains MD, MD Referring MD:         Marguerita Merles, MD (Referring MD) Medicines:            Monitored Anesthesia Care Complications:        No immediate complications. Procedure:            Pre-Anesthesia Assessment:                       - Prior to the procedure, a History and Physical was                        performed, and patient medications, allergies and                        sensitivities were reviewed. The patient's tolerance of                        previous anesthesia was reviewed.                       - The risks and benefits of the procedure and the                        sedation options and risks were discussed with the                        patient. All questions were answered and informed                        consent was obtained.                       - Patient identification and proposed procedure were                        verified prior to the procedure by the physician, the                        nurse, the anesthesiologist, the anesthetist and the                        technician. The procedure was verified in the                        pre-procedure area in the procedure room in the                        endoscopy suite.                       - Prophylactic Antibiotics: The patient does not  require prophylactic antibiotics.                       - ASA Grade Assessment: II - A patient with mild                        systemic disease.                       - After reviewing the risks and benefits, the patient     was deemed in satisfactory condition to undergo the                        procedure.                       - Monitored anesthesia care was determined to be                        medically necessary for this procedure based on review                        of the patient's medical history, medications, and                        prior anesthesia history.                       - The anesthesia plan was to use monitored anesthesia                        care (MAC).                       After obtaining informed consent, the colonoscope was                        passed under direct vision. Throughout the procedure,                        the patient's blood pressure, pulse, and oxygen                        saturations were monitored continuously. The                        Colonoscope was introduced through the anus and                        advanced to the the cecum, identified by appendiceal                        orifice and ileocecal valve. The colonoscopy was                        performed with ease. The patient tolerated the                        procedure well. The quality of the bowel preparation                        was poor. Findings:  The perianal and digital rectal examinations were normal.      A moderate amount of semi-liquid semi-solid stool was found in the       entire colon, interfering with visualization.      A localized area of mildly thickened folds of the mucosa was found in       the sigmoid colon. Biopsies were taken with a cold forceps for histology.      The exam was otherwise normal throughout the examined colon.      The retroflexed view of the distal rectum and anal verge was normal and       showed no anal or rectal abnormalities. Impression:           - Preparation of the colon was poor.                       - Stool in the entire examined colon.                       - Thickened folds of the mucosa in the sigmoid colon.                         Biopsied.                       - The distal rectum and anal verge are normal on                        retroflexion view. Recommendation:       - Repeat colonoscopy at the next available appointment                        (with 2 day prep) because the bowel preparation was                        poor.                       - Continue present medications.                       - Return to primary care physician as previously                        scheduled.                       - The findings and recommendations were discussed with                        the patient.                       - The findings and recommendations were discussed with                        the patient's family. Procedure Code(s):    --- Professional ---                       [id number], Colonoscopy, flexible; with biopsy, single or                        multiple Diagnosis  Code(s):    --- Professional ---                       Z12.11, Encounter for screening for malignant neoplasm                        of colon                       K63.89, Other specified diseases of intestine CPT copyright 2019 American Medical Association. All rights reserved. The codes documented in this report are preliminary and upon coder review may  be revised to meet current compliance requirements.  Vonda Antigua, MD Margretta Sidle B. Bonna Gains MD, MD 12/22/2018 11:56:01 AM This report has been signed electronically. Number of Addenda: 0 Note Initiated On: 12/22/2018 10:04 AM Estimated Blood Loss: Estimated blood loss: none.      [facility]

## 2018-12-22 NOTE — OR Nursing (Signed)
Tawana Scale care giver in to answer question regarding pt.

## 2018-12-22 NOTE — Anesthesia Procedure Notes (Signed)
Date/Time: 12/22/2018 10:32 AM Performed by: Doreen Salvage, CRNA Pre-anesthesia Checklist: Patient identified, Emergency Drugs available, Suction available and Patient being monitored Patient Re-evaluated:Patient Re-evaluated prior to induction Oxygen Delivery Method: Nasal cannula Induction Type: IV induction Dental Injury: Teeth and Oropharynx as per pre-operative assessment  Comments: Nasal cannula with etCO2 monitoring

## 2018-12-22 NOTE — Op Note (Signed)
[facility] Gastroenterology Patient Name: Jason Reed Procedure Date: 12/22/2018 10:06 AM MRN: [id number] Account #: [id number] Date of Birth: 09-22-1960 Admit Type: Outpatient Age: 58 Room: [facility] ENDO ROOM 3 Gender: Male Note Status: Finalized Procedure:            Upper GI endoscopy Indications:          Weight loss Providers:            Tucker Minter B. Bonna Gains MD, MD Referring MD:         Marguerita Merles, MD (Referring MD) Medicines:            Monitored Anesthesia Care Complications:        No immediate complications. Procedure:            Pre-Anesthesia Assessment:                       - The risks and benefits of the procedure and the                        sedation options and risks were discussed with the                        patient. All questions were answered and informed                        consent was obtained.                       - Patient identification and proposed procedure were                        verified prior to the procedure.                       - ASA Grade Assessment: II - A patient with mild                        systemic disease.                       After obtaining informed consent, the endoscope was                        passed under direct vision. Throughout the procedure,                        the patient's blood pressure, pulse, and oxygen                        saturations were monitored continuously. The Endoscope                        was introduced through the mouth, and advanced to the                        third part of duodenum. The upper GI endoscopy was                        accomplished with ease. The patient tolerated the  procedure well. 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 Biopsies were taken with a cold forceps for       histology.      The exam of the esophagus was otherwise normal.      A single  5 mm submucosal papule (nodule) with no bleeding and no       stigmata of recent bleeding was found in the gastric antrum. Biopsies       were taken with a cold forceps for histology.      A few, 3 to 4 mm non-bleeding erosions were found in the gastric antrum.       There were no stigmata of recent bleeding. Biopsies were obtained in the       gastric body, at the incisura and in the gastric antrum with cold       forceps for histology.      The exam of the stomach was otherwise normal.      The examined duodenum was normal. Impression:           - Ectopic gastric mucosa in the proximal esophagus.                        Biopsied.                       - A single submucosal papule (nodule) found in the                        stomach. Biopsied.                       - Non-bleeding erosive gastropathy.                       - Normal examined duodenum.                       - Biopsies were obtained in the gastric body, at the                        incisura and in the gastric antrum. Recommendation:       - Await pathology results.                       - Continue present medications.                       - Return to primary care physician as previously                        scheduled.                       - Resume previous diet.                       - The findings and recommendations were discussed with                        the patient.                       -  The findings and recommendations were discussed with                        the patient's family. Procedure Code(s):    --- Professional ---                       [phone], Esophagogastroduodenoscopy, flexible, transoral;                        with biopsy, single or multiple Diagnosis Code(s):    --- Professional ---                       Q40.2, Other specified congenital malformations of                        stomach                       K31.89, Other diseases of stomach and duodenum                       R63.4, Abnormal  weight loss CPT copyright 2019 American Medical Association. All rights reserved. The codes documented in this report are preliminary and upon coder review may  be revised to meet current compliance requirements.  Vonda Antigua, MD Margretta Sidle B. Bonna Gains MD, MD 12/22/2018 11:50:39 AM This report has been signed electronically. Number of Addenda: 0 Note Initiated On: 12/22/2018 10:06 AM Scope Withdrawal Time: 0 hours 11 minutes 45 seconds  Total Procedure Duration: 0 hours 38 minutes 6 seconds  Estimated Blood Loss: Estimated blood loss: none.      [facility]

## 2018-12-22 NOTE — Anesthesia Post-op Follow-up Note (Signed)
Anesthesia QCDR form completed.        

## 2018-12-22 NOTE — Anesthesia Postprocedure Evaluation (Signed)
Anesthesia Post Note  Patient: Jason Reed  Procedure(s) Performed: COLONOSCOPY WITH PROPOFOL (N/A ) ESOPHAGOGASTRODUODENOSCOPY (EGD) WITH PROPOFOL (N/A )  Patient location during evaluation: Endoscopy Anesthesia Type: General Level of consciousness: awake and alert and oriented Pain management: pain level controlled Vital Signs Assessment: post-procedure vital signs reviewed and stable Respiratory status: spontaneous breathing Cardiovascular status: blood pressure returned to baseline Anesthetic complications: no     Last Vitals:  Vitals:   12/22/18 1203 12/22/18 1213  BP: 121/76 121/76  Pulse: 72 67  Resp: 14 12  Temp:    SpO2: 100% 100%    Last Pain:  Vitals:   12/22/18 1213  TempSrc:   PainSc: 0-No pain                 [name]

## 2018-12-22 NOTE — Transfer of Care (Signed)
Immediate Anesthesia Transfer of Care Note  Patient: Jason Reed  Procedure(s) Performed: Procedure(s): COLONOSCOPY WITH PROPOFOL (N/A) ESOPHAGOGASTRODUODENOSCOPY (EGD) WITH PROPOFOL (N/A)  Patient Location: PACU and Endoscopy Unit  Anesthesia Type:General  Level of Consciousness: sedated  Airway & Oxygen Therapy: Patient Spontanous Breathing and Patient connected to nasal cannula oxygen  Post-op Assessment: Report given to RN and Post -op Vital signs reviewed and stable  Post vital signs: Reviewed and stable  Last Vitals:  Vitals:   12/22/18 0952 12/22/18 1143  BP: (!) 148/77 (!) 102/55  Pulse: 90 77  Resp: 18 16  Temp: 36.6 C (!) 36.1 C  SpO2: 16% 967%    Complications: No apparent anesthesia complications

## 2018-12-23 ENCOUNTER — Ambulatory Visit: Payer: Medicare Other | Admitting: Certified Registered Nurse Anesthetist

## 2018-12-23 ENCOUNTER — Ambulatory Visit
Admission: RE | Admit: 2018-12-23 | Discharge: 2018-12-23 | Disposition: A | Discharge status: Home / Self Care | Payer: Medicare Other | Attending: Gastroenterology | Admitting: Gastroenterology

## 2018-12-23 ENCOUNTER — Encounter: Payer: Self-pay | Admitting: Gastroenterology

## 2018-12-23 ENCOUNTER — Other Ambulatory Visit: Payer: Self-pay

## 2018-12-23 ENCOUNTER — Encounter
Admission: RE | Disposition: A | Discharge status: Home / Self Care | Payer: Self-pay | Source: Home / Self Care | Attending: Gastroenterology

## 2018-12-23 DIAGNOSIS — Z1211 Encounter for screening for malignant neoplasm of colon: Secondary | ICD-10-CM | POA: Insufficient documentation

## 2018-12-23 DIAGNOSIS — K621 Rectal polyp: Secondary | ICD-10-CM | POA: Diagnosis not present

## 2018-12-23 DIAGNOSIS — Z7982 Long term (current) use of aspirin: Secondary | ICD-10-CM | POA: Diagnosis not present

## 2018-12-23 DIAGNOSIS — F71 Moderate intellectual disabilities: Secondary | ICD-10-CM | POA: Insufficient documentation

## 2018-12-23 DIAGNOSIS — Z7984 Long term (current) use of oral hypoglycemic drugs: Secondary | ICD-10-CM | POA: Insufficient documentation

## 2018-12-23 DIAGNOSIS — Z79899 Other long term (current) drug therapy: Secondary | ICD-10-CM | POA: Insufficient documentation

## 2018-12-23 DIAGNOSIS — E785 Hyperlipidemia, unspecified: Secondary | ICD-10-CM | POA: Insufficient documentation

## 2018-12-23 DIAGNOSIS — I10 Essential (primary) hypertension: Secondary | ICD-10-CM | POA: Insufficient documentation

## 2018-12-23 DIAGNOSIS — E119 Type 2 diabetes mellitus without complications: Secondary | ICD-10-CM | POA: Diagnosis not present

## 2018-12-23 DIAGNOSIS — R569 Unspecified convulsions: Secondary | ICD-10-CM | POA: Insufficient documentation

## 2018-12-23 HISTORY — PX: COLONOSCOPY WITH PROPOFOL: SHX5780

## 2018-12-23 LAB — GLUCOSE, CAPILLARY: Glucose-Capillary: 107 mg/dL — ABNORMAL HIGH (ref 70–99)

## 2018-12-23 SURGERY — COLONOSCOPY WITH PROPOFOL
Anesthesia: General

## 2018-12-23 MED ORDER — PROPOFOL 10 MG/ML IV BOLUS
INTRAVENOUS | Status: DC | PRN
  Administered 2018-12-23: 50 mg via INTRAVENOUS

## 2018-12-23 MED ORDER — PROPOFOL 500 MG/50ML IV EMUL
INTRAVENOUS | Status: DC | PRN
  Administered 2018-12-23: 125 ug/kg/min via INTRAVENOUS

## 2018-12-23 MED ORDER — SODIUM CHLORIDE 0.9 % IV SOLN
INTRAVENOUS | Status: DC | PRN
  Administered 2018-12-23: 14:00:00 via INTRAVENOUS

## 2018-12-23 MED ORDER — PROPOFOL 500 MG/50ML IV EMUL
INTRAVENOUS | Status: AC
  Filled 2018-12-23: qty 50

## 2018-12-23 MED ORDER — SODIUM CHLORIDE 0.9 % IV SOLN
INTRAVENOUS | Status: DC
  Administered 2018-12-23: 13:00:00 via INTRAVENOUS

## 2018-12-23 MED ORDER — PHENYLEPHRINE HCL (PRESSORS) 10 MG/ML IV SOLN
INTRAVENOUS | Status: DC | PRN
  Administered 2018-12-23 (×4): 100 ug via INTRAVENOUS
  Administered 2018-12-23: 50 ug via INTRAVENOUS
  Administered 2018-12-23: 100 ug via INTRAVENOUS

## 2018-12-23 NOTE — Op Note (Signed)
[facility] Gastroenterology Patient Name: Jason Reed Procedure Date: 12/23/2018 2:14 PM MRN: [id number] Account #: [id number] Date of Birth: November 01, 1960 Admit Type: Outpatient Age: 58 Room: [facility] ENDO ROOM 3 Gender: Male Note Status: Finalized Procedure:            Colonoscopy Indications:          Screening for colorectal malignant neoplasm Providers:            Kawana Hegel B. Bonna Gains MD, MD Referring MD:         Marguerita Merles, MD (Referring MD) Medicines:            Monitored Anesthesia Care Complications:        No immediate complications. Procedure:            Pre-Anesthesia Assessment:                       - ASA Grade Assessment: II - A patient with mild                        systemic disease.                       - Prior to the procedure, a History and Physical was                        performed, and patient medications, allergies and                        sensitivities were reviewed. The patient's tolerance of                        previous anesthesia was reviewed.                       - The risks and benefits of the procedure and the                        sedation options and risks were discussed with the                        patient. All questions were answered and informed                        consent was obtained.                       - Patient identification and proposed procedure were                        verified prior to the procedure by the physician, the                        nurse, the anesthesiologist, the anesthetist and the                        technician. The procedure was verified in the procedure                        room.  After obtaining informed consent, the colonoscope was                        passed under direct vision. Throughout the procedure,                        the patient's blood pressure, pulse, and oxygen                        saturations were monitored continuously. The                    Colonoscope was introduced through the anus and                        advanced to the the cecum, identified by appendiceal                        orifice and ileocecal valve. The colonoscopy was                        performed with ease. The patient tolerated the                        procedure well. The quality of the bowel preparation                        was good except the ascending colon was fair. Findings:      The perianal and digital rectal examinations were normal.      A 4 mm polyp was found in the rectum. The polyp was sessile. The polyp       was removed with a cold biopsy forceps. Resection and retrieval were       complete.      The exam was otherwise without abnormality.      The rectum, sigmoid colon, descending colon, transverse colon, ascending       colon and cecum appeared normal.      The retroflexed view of the distal rectum and anal verge was normal and       showed no anal or rectal abnormalities. Impression:           - One 4 mm polyp in the rectum, removed with a cold                        biopsy forceps. Resected and retrieved.                       - The examination was otherwise normal.                       - The rectum, sigmoid colon, descending colon,                        transverse colon, ascending colon and cecum are normal.                       - The distal rectum and anal verge are normal on                        retroflexion view. Recommendation:       -  Discharge patient to home (with escort).                       - Advance diet as tolerated.                       - Continue present medications.                       - Await pathology results.                       - Repeat colonoscopy in 3 years, with 2 day prep.                       - The findings and recommendations were discussed with                        the patient.                       - The findings and recommendations were discussed with                         the patient's family.                       - Return to primary care physician as previously                        scheduled. Procedure Code(s):    --- Professional ---                       [phone], Colonoscopy, flexible; with biopsy, single or                        multiple Diagnosis Code(s):    --- Professional ---                       Z12.11, Encounter for screening for malignant neoplasm                        of colon                       K62.1, Rectal polyp CPT copyright 2019 American Medical Association. All rights reserved. The codes documented in this report are preliminary and upon coder review may  be revised to meet current compliance requirements.  Vonda Antigua, MD Margretta Sidle B. Bonna Gains MD, MD 12/23/2018 3:27:00 PM This report has been signed electronically. Number of Addenda: 0 Note Initiated On: 12/23/2018 2:14 PM Scope Withdrawal Time: 0 hours 25 minutes 33 seconds  Total Procedure Duration: 0 hours 53 minutes 7 seconds  Estimated Blood Loss: Estimated blood loss: none.      [facility]

## 2018-12-23 NOTE — Transfer of Care (Signed)
Immediate Anesthesia Transfer of Care Note  Patient: Jason Reed  Procedure(s) Performed: COLONOSCOPY WITH PROPOFOL (N/A )  Patient Location: Endoscopy Unit  Anesthesia Type:General  Level of Consciousness: drowsy and patient cooperative  Airway & Oxygen Therapy: Patient Spontanous Breathing and Patient connected to nasal cannula oxygen  Post-op Assessment: Report given to RN and Post -op Vital signs reviewed and stable  Post vital signs: Reviewed and stable  Last Vitals:  Vitals Value Taken Time  BP 100/57 12/23/18 1524  Temp 36.8 C 12/23/18 1520  Pulse 76 12/23/18 1525  Resp 15 12/23/18 1525  SpO2 100 % 12/23/18 1525  Vitals shown include unvalidated device data.  Last Pain:  Vitals:   12/23/18 1520  TempSrc: Tympanic  PainSc:          Complications: No apparent anesthesia complications

## 2018-12-23 NOTE — Anesthesia Preprocedure Evaluation (Addendum)
Anesthesia Evaluation  Patient identified by MRN, date of birth, ID band Patient awake    Reviewed: Allergy & Precautions, H&P , NPO status , Patient's Chart, lab work & pertinent test results  Airway Mallampati: III  TM Distance: >3 FB Neck ROM: full   Comment: underbite Dental  (+) Chipped, Missing   Pulmonary neg pulmonary ROS, neg COPD,           Cardiovascular hypertension, (-) Past MI and (-) Cardiac Stents negative cardio ROS  (-) dysrhythmias      Neuro/Psych Seizures - (no seizures in past 10 years per caregiver),  Moderate mental retardation negative psych ROS   GI/Hepatic negative GI ROS, Neg liver ROS,   Endo/Other  diabetes  Renal/GU negative Renal ROS  negative genitourinary   Musculoskeletal   Abdominal   Peds  Hematology negative hematology ROS (+)   Anesthesia Other Findings Obese  Past Medical History: No date: Diabetes ([place]) No date: HTN (hypertension) No date: Hyperlipidemia No date: Psychosis ([place]) No date: Seizure [facility])  Past Surgical History: 12/22/2018: COLONOSCOPY WITH PROPOFOL; N/A     Comment:  Procedure: COLONOSCOPY WITH PROPOFOL;  Surgeon:               Virgel Manifold, MD;  Location: [facility] ENDOSCOPY;                Service: Endoscopy;  Laterality: N/A; 12/22/2018: ESOPHAGOGASTRODUODENOSCOPY (EGD) WITH PROPOFOL; N/A     Comment:  Procedure: ESOPHAGOGASTRODUODENOSCOPY (EGD) WITH               PROPOFOL;  Surgeon: Virgel Manifold, MD;  Location:               [facility] ENDOSCOPY;  Service: Endoscopy;  Laterality: N/A;  BMI    Body Mass Index: 33.77 kg/m      Reproductive/Obstetrics negative OB ROS                            Anesthesia Physical Anesthesia Plan  ASA: II  Anesthesia Plan: General   Post-op Pain Management:    Induction:   PONV Risk Score and Plan: Propofol infusion and TIVA  Airway Management Planned:   Additional  Equipment:   Intra-op Plan:   Post-operative Plan:   Informed Consent: I have reviewed the patients History and Physical, chart, labs and discussed the procedure including the risks, benefits and alternatives for the proposed anesthesia with the patient or authorized representative who has indicated his/her understanding and acceptance.     Dental Advisory Given  Plan Discussed with: Anesthesiologist and CRNA  Anesthesia Plan Comments:        Anesthesia Quick Evaluation

## 2018-12-23 NOTE — Anesthesia Post-op Follow-up Note (Signed)
Anesthesia QCDR form completed.        

## 2018-12-23 NOTE — H&P (Addendum)
Vonda Antigua, MD [address], [place], [place], [place], [address] [address], [place], [place], [place], [address] Phone: [phone]  Fax: [phone]  Primary Care Physician:  Marguerita Merles, MD   Pre-Procedure History & Physical: HPI:  Jason Reed is a 58 y.o. male is here for a colonoscopy.   Past Medical History:  Diagnosis Date  . Diabetes ([place])   . HTN (hypertension)   . Hyperlipidemia   . Psychosis ([place])   . Seizure [facility])     Past Surgical History:  Procedure Laterality Date  . COLONOSCOPY WITH PROPOFOL N/A 12/22/2018   Procedure: COLONOSCOPY WITH PROPOFOL;  Surgeon: Virgel Manifold, MD;  Location: [facility] ENDOSCOPY;  Service: Endoscopy;  Laterality: N/A;  . ESOPHAGOGASTRODUODENOSCOPY (EGD) WITH PROPOFOL N/A 12/22/2018   Procedure: ESOPHAGOGASTRODUODENOSCOPY (EGD) WITH PROPOFOL;  Surgeon: Virgel Manifold, MD;  Location: [facility] ENDOSCOPY;  Service: Endoscopy;  Laterality: N/A;    Prior to Admission medications   Medication Sig Start Date End Date Taking? Authorizing Provider  acetaminophen (TYLENOL) 325 MG tablet Take 650 mg by mouth every 6 (six) hours as needed.    [provider]  ARIPiprazole (ABILIFY) 15 MG tablet Take 15 mg by mouth daily.    [provider]  aspirin EC 81 MG tablet Take 81 mg by mouth daily.    [provider]  chlorhexidine (PERIDEX) 0.12 % solution Use as directed 15 mLs in the mouth or throat 2 (two) times daily.    [provider]  ciprofloxacin (CIPRO) 500 MG tablet Take 1 tablet (500 mg total) by mouth 2 (two) times daily. Patient not taking: Reported on 11/22/2018 01/12/17   Nicholes Mango, MD  clonazePAM (KLONOPIN) 1 MG tablet Take 1 mg by mouth 3 (three) times daily as needed for anxiety. Take 1 mg by mouth every 12 hours as needed and take 1 mg by mouth at bedtime.    [provider]  feeding supplement, ENSURE ENLIVE, (ENSURE ENLIVE) LIQD Take 237 mLs by mouth 2 (two) times  daily between meals. 01/13/17   Gouru, Illene Silver, MD  feeding supplement, GLUCERNA SHAKE, (GLUCERNA SHAKE) LIQD Take 237 mLs by mouth 3 (three) times daily with meals. 01/12/17   Gouru, Illene Silver, MD  gabapentin (NEURONTIN) 300 MG capsule Take 300 mg by mouth 3 (three) times daily.    [provider]  gabapentin (NEURONTIN) 600 MG tablet  10/25/18   [provider]  guaiFENesin-dextromethorphan (ROBITUSSIN DM) 100-10 MG/5ML syrup Take 10 mLs by mouth every 4 (four) hours as needed for cough.    [provider]  hydrochlorothiazide (HYDRODIURIL) 25 MG tablet Take 25 mg by mouth daily.    [provider]  hydrocortisone cream 1 % Apply 1 application topically 2 (two) times daily.    [provider]  loratadine (CLARITIN) 10 MG tablet Take 10 mg by mouth daily.    [provider]  losartan (COZAAR) 100 MG tablet Take 100 mg by mouth daily.    [provider]  metFORMIN (GLUCOPHAGE) 500 MG tablet Take 500 mg by mouth 2 (two) times daily with a meal.    [provider]  metoprolol succinate (TOPROL-XL) 25 MG 24 hr tablet Take 25 mg by mouth daily.    [provider]  mirtazapine (REMERON SOL-TAB) 15 MG disintegrating tablet Take 15 mg by mouth at bedtime.    [provider]  ondansetron (ZOFRAN-ODT) 4 MG disintegrating tablet Take 4 mg by mouth every 6 (six) hours as needed  for nausea or vomiting.    [provider]  phenytoin (DILANTIN) 100 MG ER capsule Take 100-300 mg by mouth 2 (two) times daily. Take 100 mg by mouth in the morning and 300 mg by mouth at bedtime.    [provider]  psyllium (METAMUCIL) 58.6 % powder Take 1 packet by mouth daily.    [provider]  QUEtiapine (SEROQUEL) 400 MG tablet Take 800 mg by mouth at bedtime.    [provider]  simvastatin (ZOCOR) 20 MG tablet Take 20 mg by mouth at bedtime.    [provider]  traZODone (DESYREL) 50 MG tablet Take 50  mg by mouth at bedtime.    [provider]    Allergies as of 12/22/2018 - Review Complete 12/22/2018  Allergen Reaction Noted  . Lisinopril  01/08/2017    No family history on file.  Social History   Socioeconomic History  . Marital status: Single    Spouse name: Not on file  . Number of children: Not on file  . Years of education: Not on file  . Highest education level: Not on file  Occupational History  . Not on file  Social Needs  . Financial resource strain: Not on file  . Food insecurity    Worry: Not on file    Inability: Not on file  . Transportation needs    Medical: Not on file    Non-medical: Not on file  Tobacco Use  . Smoking status: Never Smoker  . Smokeless tobacco: Never Used  Substance and Sexual Activity  . Alcohol use: No  . Drug use: Not Currently  . Sexual activity: Not on file  Lifestyle  . Physical activity    Days per week: Not on file    Minutes per session: Not on file  . Stress: Not on file  Relationships  . Social Herbalist on phone: Not on file    Gets together: Not on file    Attends religious service: Not on file    Active member of club or organization: Not on file    Attends meetings of clubs or organizations: Not on file    Relationship status: Not on file  . Intimate partner violence    Fear of current or ex partner: Not on file    Emotionally abused: Not on file    Physically abused: Not on file    Forced sexual activity: Not on file  Other Topics Concern  . Not on file  Social History Narrative  . Not on file    Review of Systems: See HPI, otherwise negative ROS  Physical Exam: BP (!) 147/87   Pulse 84   Temp (!) 96.7 F (35.9 C) (Tympanic)   Resp 16   Ht 6\' 2"  (1.88 m)   Wt 119.3 kg   SpO2 98%   BMI 33.77 kg/m  General:   Alert,  pleasant and cooperative in NAD Head:  Normocephalic and atraumatic. Neck:  Supple; no masses or thyromegaly. Lungs:  Clear throughout to auscultation,  normal respiratory effort.    Heart:  +S1, +S2, Regular rate and rhythm, No edema. Abdomen:  Soft, nontender and nondistended. Normal bowel sounds, without guarding, and without rebound.   Neurologic:  Alert and  oriented x4;  grossly normal neurologically.  Impression/Plan: Jason Reed is here for a colonoscopy to be performed for average risk screening. Pt had broth this morning around 9 am. It was clear broth. We verified  with the staff who gave him the broth and she said it was half a cup and read [place] the ingredient list from the box which was labeled , [employer] Chicken Broth, 0% fat. Since this is fat free broth it is considered a clear liquid. This was also verified with online versions of [employer] fat free broth ingredient list which shows 0% fat. Dr. Ola Spurr has discussed this in detail with the patient and reviewed this as well and is agreeable to proceeding with the procedure. It is now 2:18 PM and we are about to start the procedure. Clear liquids can be taken upto 2 hrs prior to endoscopic procedures.   Risks, benefits, limitations, and alternatives regarding  colonoscopy have been reviewed with the patient.  Questions have been answered.  All parties agreeable.   Virgel Manifold, MD  12/23/2018, 12:29 PM

## 2018-12-23 NOTE — Anesthesia Postprocedure Evaluation (Signed)
Anesthesia Post Note  Patient: Jason Reed  Procedure(s) Performed: COLONOSCOPY WITH PROPOFOL (N/A )  Patient location during evaluation: Endoscopy Anesthesia Type: General Level of consciousness: awake and alert Pain management: pain level controlled Vital Signs Assessment: post-procedure vital signs reviewed and stable Respiratory status: spontaneous breathing, nonlabored ventilation, respiratory function stable and patient connected to nasal cannula oxygen Cardiovascular status: blood pressure returned to baseline and stable Postop Assessment: no apparent nausea or vomiting Anesthetic complications: no     Last Vitals:  Vitals:   12/23/18 1549 12/23/18 1550  BP: 112/60   Pulse: 63 68  Resp: 15 17  Temp:    SpO2: 100% 100%    Last Pain:  Vitals:   12/23/18 1520  TempSrc: Tympanic  PainSc:                  Martha Clan

## 2018-12-26 ENCOUNTER — Encounter: Payer: Self-pay | Admitting: Gastroenterology

## 2018-12-26 LAB — SURGICAL PATHOLOGY

## 2018-12-27 ENCOUNTER — Encounter: Payer: Self-pay | Admitting: Gastroenterology

## 2018-12-27 LAB — SURGICAL PATHOLOGY

## 2019-03-03 ENCOUNTER — Ambulatory Visit
Admission: RE | Admit: 2019-03-03 | Discharge: 2019-03-03 | Disposition: A | Discharge status: Home / Self Care | Payer: Medicare Other | Source: Ambulatory Visit | Attending: Family Medicine | Admitting: Family Medicine

## 2019-03-03 ENCOUNTER — Other Ambulatory Visit: Payer: Self-pay

## 2019-03-03 ENCOUNTER — Other Ambulatory Visit: Payer: Self-pay | Admitting: Family Medicine

## 2019-03-03 DIAGNOSIS — M545 Low back pain, unspecified: Secondary | ICD-10-CM

## 2019-03-03 DIAGNOSIS — A4151 Sepsis due to Escherichia coli [E. coli]: Secondary | ICD-10-CM | POA: Diagnosis not present

## 2019-03-03 DIAGNOSIS — R6510 Systemic inflammatory response syndrome (SIRS) of non-infectious origin without acute organ dysfunction: Secondary | ICD-10-CM | POA: Diagnosis not present

## 2019-03-06 ENCOUNTER — Emergency Department: Payer: Medicare Other

## 2019-03-06 ENCOUNTER — Inpatient Hospital Stay
Admission: EM | Admit: 2019-03-06 | Discharge: 2019-03-15 | DRG: 871 | Disposition: A | Discharge status: Skilled Nursing Facility | Payer: Medicare Other | Attending: Internal Medicine | Admitting: Internal Medicine

## 2019-03-06 ENCOUNTER — Other Ambulatory Visit: Payer: Self-pay

## 2019-03-06 DIAGNOSIS — Z905 Acquired absence of kidney: Secondary | ICD-10-CM

## 2019-03-06 DIAGNOSIS — R6520 Severe sepsis without septic shock: Secondary | ICD-10-CM | POA: Diagnosis present

## 2019-03-06 DIAGNOSIS — F29 Unspecified psychosis not due to a substance or known physiological condition: Secondary | ICD-10-CM | POA: Diagnosis present

## 2019-03-06 DIAGNOSIS — Z6833 Body mass index (BMI) 33.0-33.9, adult: Secondary | ICD-10-CM | POA: Diagnosis not present

## 2019-03-06 DIAGNOSIS — N170 Acute kidney failure with tubular necrosis: Secondary | ICD-10-CM | POA: Diagnosis present

## 2019-03-06 DIAGNOSIS — Z87440 Personal history of urinary (tract) infections: Secondary | ICD-10-CM

## 2019-03-06 DIAGNOSIS — A419 Sepsis, unspecified organism: Secondary | ICD-10-CM | POA: Diagnosis present

## 2019-03-06 DIAGNOSIS — N182 Chronic kidney disease, stage 2 (mild): Secondary | ICD-10-CM

## 2019-03-06 DIAGNOSIS — E1122 Type 2 diabetes mellitus with diabetic chronic kidney disease: Secondary | ICD-10-CM | POA: Diagnosis present

## 2019-03-06 DIAGNOSIS — N390 Urinary tract infection, site not specified: Secondary | ICD-10-CM | POA: Diagnosis present

## 2019-03-06 DIAGNOSIS — B9689 Other specified bacterial agents as the cause of diseases classified elsewhere: Secondary | ICD-10-CM | POA: Diagnosis present

## 2019-03-06 DIAGNOSIS — E785 Hyperlipidemia, unspecified: Secondary | ICD-10-CM | POA: Diagnosis not present

## 2019-03-06 DIAGNOSIS — N179 Acute kidney failure, unspecified: Secondary | ICD-10-CM | POA: Diagnosis not present

## 2019-03-06 DIAGNOSIS — G40909 Epilepsy, unspecified, not intractable, without status epilepticus: Secondary | ICD-10-CM

## 2019-03-06 DIAGNOSIS — R6510 Systemic inflammatory response syndrome (SIRS) of non-infectious origin without acute organ dysfunction: Secondary | ICD-10-CM

## 2019-03-06 DIAGNOSIS — E669 Obesity, unspecified: Secondary | ICD-10-CM | POA: Diagnosis not present

## 2019-03-06 DIAGNOSIS — Z20828 Contact with and (suspected) exposure to other viral communicable diseases: Secondary | ICD-10-CM | POA: Diagnosis present

## 2019-03-06 DIAGNOSIS — A4151 Sepsis due to Escherichia coli [E. coli]: Secondary | ICD-10-CM | POA: Diagnosis present

## 2019-03-06 DIAGNOSIS — D649 Anemia, unspecified: Secondary | ICD-10-CM | POA: Diagnosis present

## 2019-03-06 DIAGNOSIS — E872 Acidosis: Secondary | ICD-10-CM | POA: Diagnosis present

## 2019-03-06 DIAGNOSIS — I129 Hypertensive chronic kidney disease with stage 1 through stage 4 chronic kidney disease, or unspecified chronic kidney disease: Secondary | ICD-10-CM | POA: Diagnosis present

## 2019-03-06 DIAGNOSIS — N136 Pyonephrosis: Secondary | ICD-10-CM | POA: Diagnosis present

## 2019-03-06 DIAGNOSIS — B370 Candidal stomatitis: Secondary | ICD-10-CM | POA: Diagnosis present

## 2019-03-06 DIAGNOSIS — E1165 Type 2 diabetes mellitus with hyperglycemia: Secondary | ICD-10-CM | POA: Diagnosis present

## 2019-03-06 DIAGNOSIS — Z794 Long term (current) use of insulin: Secondary | ICD-10-CM

## 2019-03-06 DIAGNOSIS — D696 Thrombocytopenia, unspecified: Secondary | ICD-10-CM | POA: Diagnosis present

## 2019-03-06 DIAGNOSIS — E0822 Diabetes mellitus due to underlying condition with diabetic chronic kidney disease: Secondary | ICD-10-CM

## 2019-03-06 LAB — COMPREHENSIVE METABOLIC PANEL
ALT: 50 U/L — ABNORMAL HIGH (ref 0–44)
AST: 73 U/L — ABNORMAL HIGH (ref 15–41)
Albumin: 3.5 g/dL (ref 3.5–5.0)
Alkaline Phosphatase: 79 U/L (ref 38–126)
Anion gap: 18 — ABNORMAL HIGH (ref 5–15)
BUN: 57 mg/dL — ABNORMAL HIGH (ref 6–20)
CO2: 22 mmol/L (ref 22–32)
Calcium: 8.5 mg/dL — ABNORMAL LOW (ref 8.9–10.3)
Chloride: 96 mmol/L — ABNORMAL LOW (ref 98–111)
Creatinine, Ser: 3.12 mg/dL — ABNORMAL HIGH (ref 0.61–1.24)
GFR calc Af Amer: 24 mL/min — ABNORMAL LOW (ref >60)
GFR calc non Af Amer: 21 mL/min — ABNORMAL LOW (ref >60)
Glucose, Bld: 483 mg/dL — ABNORMAL HIGH (ref 70–99)
Potassium: 4 mmol/L (ref 3.5–5.1)
Sodium: 136 mmol/L (ref 135–145)
Total Bilirubin: 1.6 mg/dL — ABNORMAL HIGH (ref 0.3–1.2)
Total Protein: 7.7 g/dL (ref 6.5–8.1)

## 2019-03-06 LAB — CBC WITH DIFFERENTIAL/PLATELET
Abs Immature Granulocytes: 0.87 10*3/uL — ABNORMAL HIGH (ref 0.00–0.07)
Basophils Absolute: 0.1 10*3/uL (ref 0.0–0.1)
Basophils Relative: 0 %
Eosinophils Absolute: 0.2 10*3/uL (ref 0.0–0.5)
Eosinophils Relative: 1 %
HCT: 34.4 % — ABNORMAL LOW (ref 39.0–52.0)
Hemoglobin: 10.9 g/dL — ABNORMAL LOW (ref 13.0–17.0)
Immature Granulocytes: 5 %
Lymphocytes Relative: 5 %
Lymphs Abs: 0.9 10*3/uL (ref 0.7–4.0)
MCH: 29.1 pg (ref 26.0–34.0)
MCHC: 31.7 g/dL (ref 30.0–36.0)
MCV: 91.7 fL (ref 80.0–100.0)
Monocytes Absolute: 1.6 10*3/uL — ABNORMAL HIGH (ref 0.1–1.0)
Monocytes Relative: 9 %
Neutro Abs: 13.7 10*3/uL — ABNORMAL HIGH (ref 1.7–7.7)
Neutrophils Relative %: 80 %
Platelets: 136 10*3/uL — ABNORMAL LOW (ref 150–400)
RBC: 3.75 MIL/uL — ABNORMAL LOW (ref 4.22–5.81)
RDW: 13.3 % (ref 11.5–15.5)
WBC: 17.3 10*3/uL — ABNORMAL HIGH (ref 4.0–10.5)
nRBC: 0 % (ref 0.0–0.2)

## 2019-03-06 LAB — BLOOD CULTURE ID PANEL (REFLEXED)

## 2019-03-06 LAB — LACTIC ACID, PLASMA
Lactic Acid, Venous: 2.9 mmol/L (ref 0.5–1.9)
Lactic Acid, Venous: 2.3 mmol/L (ref 0.5–1.9)

## 2019-03-06 LAB — URINALYSIS, COMPLETE (UACMP) WITH MICROSCOPIC
Bilirubin Urine: NEGATIVE
Glucose, UA: >=500 mg/dL — AB
Ketones, ur: 5 mg/dL — AB
Nitrite: NEGATIVE
Protein, ur: 100 mg/dL — AB
Specific Gravity, Urine: 1.016 (ref 1.005–1.030)
Squamous Epithelial / LPF: NONE SEEN (ref 0–5)
WBC, UA: >50 WBC/hpf — ABNORMAL HIGH (ref 0–5)
pH: 5 (ref 5.0–8.0)

## 2019-03-06 LAB — GLUCOSE, CAPILLARY: Glucose-Capillary: 352 mg/dL — ABNORMAL HIGH (ref 70–99)

## 2019-03-06 MED ORDER — PHENYTOIN SODIUM EXTENDED 100 MG PO CAPS
100.0000 mg | ORAL_CAPSULE | Freq: Every day | ORAL | Status: DC
  Administered 2019-03-07 – 2019-03-15 (×9): 100 mg via ORAL
  Filled 2019-03-06 (×10): qty 1

## 2019-03-06 MED ORDER — POLYETHYLENE GLYCOL 3350 17 G PO PACK: 17.0000 g | PACK | Freq: Every day | ORAL | Status: DC | PRN

## 2019-03-06 MED ORDER — ENOXAPARIN SODIUM 40 MG/0.4ML ~~LOC~~ SOLN
40.0000 mg | SUBCUTANEOUS | Status: DC
  Administered 2019-03-07 – 2019-03-14 (×9): 40 mg via SUBCUTANEOUS
  Filled 2019-03-06 (×9): qty 0.4

## 2019-03-06 MED ORDER — ACETAMINOPHEN 325 MG PO TABS
ORAL_TABLET | ORAL | Status: AC
  Filled 2019-03-06: qty 2

## 2019-03-06 MED ORDER — SODIUM CHLORIDE 0.9 % IV SOLN
INTRAVENOUS | Status: DC
  Administered 2019-03-06: 20:00:00 via INTRAVENOUS

## 2019-03-06 MED ORDER — TRAMADOL HCL 50 MG PO TABS: 50.0000 mg | ORAL_TABLET | Freq: Four times a day (QID) | ORAL | Status: DC | PRN

## 2019-03-06 MED ORDER — INSULIN DETEMIR 100 UNIT/ML ~~LOC~~ SOLN
15.0000 [IU] | Freq: Every day | SUBCUTANEOUS | Status: DC
  Administered 2019-03-07: 15 [IU] via SUBCUTANEOUS
  Filled 2019-03-06 (×2): qty 0.15

## 2019-03-06 MED ORDER — PHENYTOIN SODIUM EXTENDED 100 MG PO CAPS
300.0000 mg | ORAL_CAPSULE | Freq: Every day | ORAL | Status: DC
  Administered 2019-03-06 – 2019-03-14 (×9): 300 mg via ORAL
  Filled 2019-03-06 (×10): qty 3

## 2019-03-06 MED ORDER — ACETAMINOPHEN 650 MG RE SUPP
650.0000 mg | Freq: Four times a day (QID) | RECTAL | Status: DC | PRN
  Administered 2019-03-06: 650 mg via RECTAL
  Filled 2019-03-06: qty 1

## 2019-03-06 MED ORDER — SODIUM CHLORIDE 0.9% FLUSH
3.0000 mL | Freq: Once | INTRAVENOUS | Status: AC
  Administered 2019-03-06: 3 mL via INTRAVENOUS

## 2019-03-06 MED ORDER — SODIUM CHLORIDE 0.9 % IV SOLN
1.0000 g | INTRAVENOUS | Status: DC
  Administered 2019-03-06: 1 g via INTRAVENOUS
  Filled 2019-03-06: qty 10

## 2019-03-06 MED ORDER — ONDANSETRON HCL 4 MG/2ML IJ SOLN: 4.0000 mg | Freq: Four times a day (QID) | INTRAMUSCULAR | Status: DC | PRN

## 2019-03-06 MED ORDER — SODIUM CHLORIDE 0.9 % IV SOLN
INTRAVENOUS | Status: DC
  Administered 2019-03-06 – 2019-03-07 (×2): via INTRAVENOUS

## 2019-03-06 MED ORDER — QUETIAPINE FUMARATE 300 MG PO TABS
800.0000 mg | ORAL_TABLET | Freq: Every day | ORAL | Status: DC
  Administered 2019-03-06 – 2019-03-14 (×9): 800 mg via ORAL
  Filled 2019-03-06 (×8): qty 1
  Filled 2019-03-06: qty 2
  Filled 2019-03-06 (×2): qty 1

## 2019-03-06 MED ORDER — INSULIN ASPART 100 UNIT/ML ~~LOC~~ SOLN: 0.0000 [IU] | Freq: Three times a day (TID) | SUBCUTANEOUS | Status: DC

## 2019-03-06 MED ORDER — ONDANSETRON HCL 4 MG PO TABS: 4.0000 mg | ORAL_TABLET | Freq: Four times a day (QID) | ORAL | Status: DC | PRN

## 2019-03-06 MED ORDER — ACETAMINOPHEN 325 MG PO TABS
650.0000 mg | ORAL_TABLET | Freq: Four times a day (QID) | ORAL | Status: DC | PRN
  Administered 2019-03-07 – 2019-03-09 (×6): 650 mg via ORAL
  Filled 2019-03-06 (×6): qty 2

## 2019-03-06 MED ORDER — SODIUM CHLORIDE 0.9 % IV SOLN
Freq: Once | INTRAVENOUS | Status: AC
  Administered 2019-03-06: 13:00:00 via INTRAVENOUS

## 2019-03-06 MED ORDER — ARIPIPRAZOLE 15 MG PO TABS
15.0000 mg | ORAL_TABLET | Freq: Every day | ORAL | Status: DC
  Administered 2019-03-07 – 2019-03-15 (×9): 15 mg via ORAL
  Filled 2019-03-06 (×12): qty 1

## 2019-03-06 MED ORDER — ACETAMINOPHEN 325 MG PO TABS
650.0000 mg | ORAL_TABLET | Freq: Once | ORAL | Status: AC | PRN
  Administered 2019-03-06: 650 mg via ORAL

## 2019-03-06 MED ORDER — INSULIN ASPART 100 UNIT/ML ~~LOC~~ SOLN
0.0000 [IU] | Freq: Every day | SUBCUTANEOUS | Status: DC
  Administered 2019-03-07: 5 [IU] via SUBCUTANEOUS
  Filled 2019-03-06: qty 1

## 2019-03-06 NOTE — ED Provider Notes (Signed)
[facility] Emergency Department Provider Note       Time seen: ----------------------------------------- 11:11 AM on 03/06/2019 -----------------------------------------   I have reviewed the triage vital signs and the nursing notes.  HISTORY   Chief Complaint Altered Mental Status    HPI Jason Reed is a 58 y.o. male with a history of diabetes, hypertension, psychosis, seizure, hyperlipidemia who presents to the ED for altered mental status.  Patient comes from a group living facility with complaints of the staff that he is not acting normally.  He has been weak since yesterday with difficulty walking.  Fever was noted this morning.  The last time this happened he had a urinary tract infection.  Facility worker states that he is eating normally.  Past Medical History:  Diagnosis Date  . Diabetes ([place])   . HTN (hypertension)   . Hyperlipidemia   . Psychosis ([place])   . Seizure [facility])     Patient Active Problem List   Diagnosis Date Noted  . Rectal polyp   . Ectopic gastric tissue   . Stomach irritation   . Loss of weight   . Colon cancer screening   . Edema of colon   . Septic shock ([place]) 01/08/2017    Past Surgical History:  Procedure Laterality Date  . COLONOSCOPY WITH PROPOFOL N/A 12/22/2018   Procedure: COLONOSCOPY WITH PROPOFOL;  Surgeon: Virgel Manifold, MD;  Location: [facility] ENDOSCOPY;  Service: Endoscopy;  Laterality: N/A;  . COLONOSCOPY WITH PROPOFOL N/A 12/23/2018   Procedure: COLONOSCOPY WITH PROPOFOL;  Surgeon: Virgel Manifold, MD;  Location: [facility] ENDOSCOPY;  Service: Endoscopy;  Laterality: N/A;  . ESOPHAGOGASTRODUODENOSCOPY (EGD) WITH PROPOFOL N/A 12/22/2018   Procedure: ESOPHAGOGASTRODUODENOSCOPY (EGD) WITH PROPOFOL;  Surgeon: Virgel Manifold, MD;  Location: [facility] ENDOSCOPY;  Service: Endoscopy;  Laterality: N/A;    Allergies Lisinopril  Social History Social History   Tobacco Use  . Smoking status: Never  Smoker  . Smokeless tobacco: Never Used  Substance Use Topics  . Alcohol use: No  . Drug use: Not Currently    Review of Systems Unknown, reported altered mental status, fever and balance disturbance  All systems negative/normal/unremarkable except as stated in the HPI  ____________________________________________   PHYSICAL EXAM:  VITAL SIGNS: ED Triage Vitals  Enc Vitals Group     BP 03/06/19 0959 (!) 116/59     Pulse Rate 03/06/19 0959 (!) 123     Resp 03/06/19 0959 17     Temp 03/06/19 0959 (!) 102.3 F (39.1 C)     Temp Source 03/06/19 0959 Oral     SpO2 03/06/19 0959 97 %     Weight 03/06/19 1000 240 lb (108.9 kg)     Height 03/06/19 1000 6' (1.829 m)     Head Circumference --      Peak Flow --      Pain Score 03/06/19 1000 0     Pain Loc --      Pain Edu? --      Excl. in [place]? --     Constitutional: Alert but disoriented. Eyes: Conjunctivae are normal. Normal extraocular movements. ENT      Head: Normocephalic and atraumatic.      Nose: No congestion/rhinnorhea.      Mouth/Throat: Mucous membranes are moist.      Neck: No stridor. Cardiovascular: Rapid rate, regular rhythm. No murmurs, rubs, or gallops. Respiratory: Normal respiratory effort without tachypnea nor retractions. Breath sounds are clear and equal bilaterally. No wheezes/rales/rhonchi. Gastrointestinal:  Soft and nontender. Normal bowel sounds Musculoskeletal: Nontender with normal range of motion in extremities. No lower extremity tenderness nor edema. Neurologic:  Normal speech and language. No gross focal neurologic deficits are appreciated.  Balance disturbance is noted Skin:  Skin is warm, dry and intact. No rash noted. Psychiatric: Mood and affect are normal. Speech and behavior are normal.  ____________________________________________  EKG: Interpreted by me.  Sinus tachycardia with a rate of 123 bpm, LVH with repolarization abnormality, possible lateral infarct, normal  QT  ____________________________________________  ED COURSE:  As part of my medical decision making, I reviewed the following data within the [place] History obtained from family if available, nursing notes, old chart and ekg, as well as notes from prior ED visits. Patient presented for altered mental status and fever with possible UTI, we will assess with labs and imaging as indicated at this time.   Procedures  Jason Reed was evaluated in Emergency Department on 03/06/2019 for the symptoms described in the history of present illness. He was evaluated in the context of the global COVID-19 pandemic, which necessitated consideration that the patient might be at risk for infection with the SARS-CoV-2 virus that causes COVID-19. Institutional protocols and algorithms that pertain to the evaluation of patients at risk for COVID-19 are in a state of rapid change based on information released by regulatory bodies including the CDC and federal and state organizations. These policies and algorithms were followed during the patient's care in the ED.  ____________________________________________   LABS (pertinent positives/negatives)  Labs Reviewed  LACTIC ACID, PLASMA - Abnormal; Notable for the following components:      Result Value   Lactic Acid, Venous 2.9 (*)    All other components within normal limits  COMPREHENSIVE METABOLIC PANEL - Abnormal; Notable for the following components:   Chloride 96 (*)    Glucose, Bld 483 (*)    BUN 57 (*)    Creatinine, Ser 3.12 (*)    Calcium 8.5 (*)    AST 73 (*)    ALT 50 (*)    Total Bilirubin 1.6 (*)    GFR calc non Af Amer 21 (*)    GFR calc Af Amer 24 (*)    Anion gap 18 (*)    All other components within normal limits  CBC WITH DIFFERENTIAL/PLATELET - Abnormal; Notable for the following components:   WBC 17.3 (*)    RBC 3.75 (*)    Hemoglobin 10.9 (*)    HCT 34.4 (*)    Platelets 136 (*)    Neutro Abs 13.7 (*)     Monocytes Absolute 1.6 (*)    Abs Immature Granulocytes 0.87 (*)    All other components within normal limits  SARS CORONAVIRUS 2 (TAT 6-24 HRS)  CULTURE, BLOOD (ROUTINE X 2)  CULTURE, BLOOD (ROUTINE X 2)  URINE CULTURE  LACTIC ACID, PLASMA  URINALYSIS, COMPLETE (UACMP) WITH MICROSCOPIC  BLOOD GAS, VENOUS   CRITI3CAL CARE Performed by: Laurence Aly   Total critical care time: 30 minutes  Critical care time was exclusive of separately billable procedures and treating other patients.  Critical care was necessary to treat or prevent imminent or life-threatening deterioration.  Critical care was time spent personally by me on the following activities: development of treatment plan with patient and/or surrogate as well as nursing, discussions with consultants, evaluation of patient's response to treatment, examination of patient, obtaining history from patient or surrogate, ordering and performing treatments and interventions, ordering and  review of laboratory studies, ordering and review of radiographic studies, pulse oximetry and re-evaluation of patient's condition.  RADIOLOGY Images were viewed by me  Chest x-ray  IMPRESSION:  No edema or consolidation.  ____________________________________________   DIFFERENTIAL DIAGNOSIS   Sepsis, dehydration, electrolyte abnormality, UTI, renal failure  FINAL ASSESSMENT AND PLAN  Urinary tract infection, acute kidney injury, SIRS   Plan: The patient had presented for altered mental status with possible UTI. Patient's labs indicated significant hyperglycemia, leukocytosis with acute kidney injury and possible sepsis. Patient's imaging thus far has not revealed any acute process.  I will order a renal ultrasound to ensure a kidney stone has not caused his acute kidney injury.  We have ordered IV Rocephin to cover for UTI.  I have discussed with the hospitalist for admission.   Laurence Aly, MD    Note: This note was  generated in part or whole with voice recognition software. Voice recognition is usually quite accurate but there are transcription errors that can and very often do occur. I apologize for any typographical errors that were not detected and corrected.     Earleen Newport, MD 03/06/19 [phone]

## 2019-03-06 NOTE — ED Notes (Signed)
Pt caregiver from group home left [place] [phone]

## 2019-03-06 NOTE — ED Notes (Signed)
Date and time results received: 03/06/19 11:20am (use smartphrase ".now" to insert current time)  Test: lactic Critical Value: 2.9  Name of Provider Notified: williams  Orders Received? Or Actions Taken?: no new orders at this time

## 2019-03-06 NOTE — ED Triage Notes (Signed)
Pt is here from [facility], with c/o from staff that the pt is not acting his normal self and is very weak since yesterday. States the last time this happened the pt had a UTI, states he is eating normally, pt denies any pain. Pt is alert on arrival.

## 2019-03-06 NOTE — H&P (Signed)
History and Physical  Oaklen Gallihugh [id number] DOB: 04/10/1961 DOA: 03/06/2019  Referring physician: Lenise Arena, ER physician PCP: Marguerita Merles, MD  Outpatient Specialists: None Patient coming from: Group home & is able to ambulate with assistance  Chief Complaint: Confusion  HPI: Jason Reed is a 58 y.o. male with medical history significant for diabetes mellitus, seizure disorder and psychosis who lives in a group home who was sent over today, 11/2, with reports that he had not been acting well, with difficulty walking and reported fever.  Reportedly confused.   ED Course: The emergency room, patient found to have a mild UTI, but a white count of 17, lactic acid level of 2.3 and tachycardic.  Blood cultures were drawn and he was given IV Rocephin and given fluid boluses.  He was not hypoxic, but lab work did note acute kidney injury in the setting of his chronic kidney disease.  Pressures have since stabilized although still mildly tachycardic.  Hospitalist were called for further evaluation.  Review of Systems: Patient seen in the emergency room. Pt complains of feeling very tired  Pt denies any headache, vision changes, dysphagia, chest pain, palpitations, shortness of breath, wheeze, cough, abdominal pain, hematuria or dysuria, constipation or diarrhea, focal extremity numbness weakness or pain.  Review of systems are otherwise negative   Past Medical History:  Diagnosis Date   Diabetes ([place])    HTN (hypertension)    Hyperlipidemia    Psychosis ([place])    Seizure ([place])    Past Surgical History:  Procedure Laterality Date   COLONOSCOPY WITH PROPOFOL N/A 12/22/2018   Procedure: COLONOSCOPY WITH PROPOFOL;  Surgeon: Virgel Manifold, MD;  Location: [facility] ENDOSCOPY;  Service: Endoscopy;  Laterality: N/A;   COLONOSCOPY WITH PROPOFOL N/A 12/23/2018   Procedure: COLONOSCOPY WITH PROPOFOL;  Surgeon: Virgel Manifold, MD;  Location: [facility] ENDOSCOPY;  Service:  Endoscopy;  Laterality: N/A;   ESOPHAGOGASTRODUODENOSCOPY (EGD) WITH PROPOFOL N/A 12/22/2018   Procedure: ESOPHAGOGASTRODUODENOSCOPY (EGD) WITH PROPOFOL;  Surgeon: Virgel Manifold, MD;  Location: [facility] ENDOSCOPY;  Service: Endoscopy;  Laterality: N/A;    Social History:  reports that he has never smoked. He has never used smokeless tobacco. He reports previous drug use. He reports that he does not drink alcohol.  Lives in a group home.  Ambulates usually on his own.   Allergies  Allergen Reactions   Lisinopril     Unknown   Family history: Patient denies any medical problems run in his family  Prior to Admission medications   Medication Sig Start Date End Date Taking? Authorizing Provider  acetaminophen (TYLENOL) 325 MG tablet Take 650 mg by mouth every 6 (six) hours as needed.   Yes [provider]  ARIPiprazole (ABILIFY) 15 MG tablet Take 15 mg by mouth daily.   Yes [provider]  aspirin EC 81 MG tablet Take 81 mg by mouth daily.   Yes [provider]  clonazePAM (KLONOPIN) 1 MG tablet Take 1 mg by mouth at bedtime.    Yes [provider]  gabapentin (NEURONTIN) 600 MG tablet Take 600 mg by mouth 3 (three) times daily.  10/25/18  Yes [provider]  guaiFENesin-dextromethorphan (ROBITUSSIN DM) 100-10 MG/5ML syrup Take 10 mLs by mouth every 4 (four) hours as needed for cough.   Yes [provider]  hydrochlorothiazide (HYDRODIURIL) 25 MG tablet Take 25 mg by mouth daily.   Yes [provider]  hydrocortisone cream 1 % Apply 1 application topically 2 (two) times daily  as needed.    Yes [provider]  loratadine (CLARITIN) 10 MG tablet Take 10 mg by mouth daily.   Yes [provider]  losartan (COZAAR) 100 MG tablet Take 100 mg by mouth daily.   Yes [provider]  metFORMIN (GLUCOPHAGE) 500 MG tablet Take 500 mg by mouth 2 (two) times daily with a meal.   Yes [provider]    metoprolol succinate (TOPROL-XL) 25 MG 24 hr tablet Take 25 mg by mouth daily.   Yes [provider]  mirtazapine (REMERON SOL-TAB) 15 MG disintegrating tablet Take 15 mg by mouth at bedtime.   Yes [provider]  phenytoin (DILANTIN) 100 MG ER capsule Take 100-300 mg by mouth 2 (two) times daily. Take 100 mg by mouth in the morning and 300 mg by mouth at bedtime.   Yes [provider]  psyllium (METAMUCIL) 58.6 % powder Take 1 packet by mouth daily as needed.    Yes [provider]  QUEtiapine (SEROQUEL) 400 MG tablet Take 800 mg by mouth at bedtime.   Yes [provider]  simvastatin (ZOCOR) 20 MG tablet Take 20 mg by mouth at bedtime.   Yes [provider]  traZODone (DESYREL) 50 MG tablet Take 50 mg by mouth at bedtime.   Yes [provider]    Physical Exam: BP 133/76    Pulse (!) 108    Temp 100.2 F (37.9 C)    Resp 17    Ht 6' (1.829 m)    Wt 108.9 kg    SpO2 96%    BMI 32.55 kg/m   General: Somnolent, alert and oriented x2 Eyes: Sclera nonicteric extraocular movements are intact ENT: Normocephalic, atraumatic, mucous membranes are dry, poor dentition Neck: Supple, no JVD Cardiovascular: Regular rhythm, tachycardic Respiratory: Clear to auscultation bilaterally Abdomen: Soft, nontender, nondistended, hypoactive bowel sounds Skin: No skin breaks, tears or lesions Musculoskeletal: No clubbing or cyanosis, trace pitting edema Psychiatric: Currently not acutely psychotic and answers my questions appropriately Neurologic: No focal deficit although exam limited due to patient's somnolence          Labs on Admission:  Basic Metabolic Panel: Recent Labs  Lab 03/06/19 1043  NA 136  K 4.0  CL 96*  CO2 22  GLUCOSE 483*  BUN 57*  CREATININE 3.12*  CALCIUM 8.5*   Liver Function Tests: Recent Labs  Lab 03/06/19 1043  AST 73*  ALT 50*  ALKPHOS 79  BILITOT 1.6*  PROT 7.7  ALBUMIN 3.5   No results for  input(s): LIPASE, AMYLASE in the last 168 hours. No results for input(s): AMMONIA in the last 168 hours. CBC: Recent Labs  Lab 03/06/19 1043  WBC 17.3*  NEUTROABS 13.7*  HGB 10.9*  HCT 34.4*  MCV 91.7  PLT 136*   Cardiac Enzymes: No results for input(s): CKTOTAL, CKMB, CKMBINDEX, TROPONINI in the last 168 hours.  BNP (last 3 results) No results for input(s): BNP in the last 8760 hours.  ProBNP (last 3 results) No results for input(s): PROBNP in the last 8760 hours.  CBG: No results for input(s): GLUCAP in the last 168 hours.  Radiological Exams on Admission: Dg Chest 2 View  Result Date: 03/06/2019 CLINICAL DATA:  Altered mental status EXAM: CHEST - 2 VIEW COMPARISON:  January 08, 2017 FINDINGS: No edema or consolidation. Heart size and pulmonary vascularity are normal. No adenopathy. There is degenerative change in thoracic spine. IMPRESSION: No edema or consolidation. Electronically Signed  By: Lowella Grip III M.D.   On: 03/06/2019 11:06   US Renal  Result Date: 03/06/2019 CLINICAL DATA:  Acute kidney injury. History of a right nephrectomy. EXAM: RENAL / URINARY TRACT ULTRASOUND COMPLETE COMPARISON:  CT, 01/08/2017 FINDINGS: Right Kidney: Surgically absent.  No mass in the right renal fossa. Left Kidney: Renal measurements: 13.1 x 6.6 x 6.4 cm = volume: 290 mL. Normal parenchymal echogenicity. Mild dilation of the intrarenal collecting system. No mass or stone. Bladder: Small outpouching from the right posterior bladder, which may be at the right ureteral trigone. This contain some apparent debris. No other bladder abnormality. Other: None. IMPRESSION: 1. Mild dilation of the left intrarenal collecting system. This may be physiologic. Mild hydronephrosis from partial obstruction is possible, and should be considered if there are consistent clinical findings. No other left kidney abnormality. 2. Status post right nephrectomy. 3. Small diverticulum containing debris from the  right posterior bladder. Electronically Signed   By: Lajean Manes M.D.   On: 03/06/2019 15:57    EKG: Independently reviewed.  Left ventricular hypertrophy with QRS widening, ST and T wave abnormalities in the anterior and lateral leads  Assessment/Plan Present on Admission:  Hyperlipidemia  Sepsis ([place])  Acute lower UTI  AKI (acute kidney injury) ([place])  Obesity (BMI 30-39.9)  Psychosis ([place])  Principal Problem:   Sepsis ([place]) secondary to acute lower UTI: Patient meets criteria for sepsis on admission given lactic acidosis, acute kidney injury, tachycardia and leukocytosis.  Blood cultures are pending.  Has received IV antibiotics and aggressive IV fluid resuscitation.  Follow-up lactic acid level pending.  Follow blood pressure, procalcitonin level in the morning.  Reviewed renal ultrasound, no evidence of the right obstruction. Active Problems:   Hyperlipidemia    Diabetes mellitus due to underlying condition with stage 2 chronic kidney disease, with long-term current use of insulin ([place]): Sliding scale and have ordered subcu Lantus at a decreased dose.  Checking A1c.    AKI (acute kidney injury) ([place]) in the setting of stage II chronic kidney disease: Secondary to sepsis.  Aggressive fluid resuscitation, follow labs.    Obesity (BMI 30-39.9): Patient meets criteria with BMI greater than 30.    Psychosis [facility]): Continuing home medications.    Seizure disorder [facility]): Continue Dilantin, check Dilantin level.   DVT prophylaxis: Lovenox  Code Status: Full code  Family Communication: Friend listed on emergency contact she.  Left message.  Disposition Plan: Place in ICU, need to stabilize infection  Consults called: None  Admission status: Given need for acute hospital services, admit as inpatient    Annita Brod MD [facility] Pager [phone]  If 7PM-7AM, please contact night-coverage [email or web address] Password [facility]  03/06/2019, 5:24 PM

## 2019-03-06 NOTE — ED Triage Notes (Signed)
Lab called to assist with specimen collection. Spoke with Nolon Stalls.

## 2019-03-06 NOTE — Progress Notes (Signed)
CODE SEPSIS - PHARMACY COMMUNICATION  **Broad Spectrum Antibiotics should be administered within 1 hour of Sepsis diagnosis**  Time Code Sepsis Called/Page Received: 2325  Antibiotics Ordered: ceftriaxone  Time of 1st antibiotic administration: 1239  Additional action taken by pharmacy:   If necessary, Name of Provider/Nurse Contacted:     Tobie Lords ,PharmD Clinical Pharmacist  03/06/2019  11:33 PM

## 2019-03-06 NOTE — ED Notes (Signed)
Labs drawn and sent -

## 2019-03-07 LAB — GLUCOSE, CAPILLARY
Glucose-Capillary: 325 mg/dL — ABNORMAL HIGH (ref 70–99)
Glucose-Capillary: 337 mg/dL — ABNORMAL HIGH (ref 70–99)
Glucose-Capillary: 236 mg/dL — ABNORMAL HIGH (ref 70–99)
Glucose-Capillary: 217 mg/dL — ABNORMAL HIGH (ref 70–99)
Glucose-Capillary: 188 mg/dL — ABNORMAL HIGH (ref 70–99)
Glucose-Capillary: 173 mg/dL — ABNORMAL HIGH (ref 70–99)

## 2019-03-07 LAB — BLOOD GAS, ARTERIAL
Acid-base deficit: 4.5 mmol/L — ABNORMAL HIGH (ref 0.0–2.0)
Bicarbonate: 19.2 mmol/L — ABNORMAL LOW (ref 20.0–28.0)
FIO2: 0.21
O2 Saturation: 92.7 %
Patient temperature: 37
pCO2 arterial: 31 mmHg — ABNORMAL LOW (ref 32.0–48.0)
pH, Arterial: 7.4 (ref 7.350–7.450)
pO2, Arterial: 66 mmHg — ABNORMAL LOW (ref 83.0–108.0)

## 2019-03-07 LAB — CBC
HCT: 29.1 % — ABNORMAL LOW (ref 39.0–52.0)
HCT: 29.7 % — ABNORMAL LOW (ref 39.0–52.0)
Hemoglobin: 9.1 g/dL — ABNORMAL LOW (ref 13.0–17.0)
Hemoglobin: 9.3 g/dL — ABNORMAL LOW (ref 13.0–17.0)
MCH: 28.4 pg (ref 26.0–34.0)
MCH: 28.6 pg (ref 26.0–34.0)
MCHC: 31.3 g/dL (ref 30.0–36.0)
MCHC: 31.3 g/dL (ref 30.0–36.0)
MCV: 90.8 fL (ref 80.0–100.0)
MCV: 91.5 fL (ref 80.0–100.0)
Platelets: 106 10*3/uL — ABNORMAL LOW (ref 150–400)
Platelets: 108 10*3/uL — ABNORMAL LOW (ref 150–400)
RBC: 3.18 MIL/uL — ABNORMAL LOW (ref 4.22–5.81)
RBC: 3.27 MIL/uL — ABNORMAL LOW (ref 4.22–5.81)
RDW: 13.2 % (ref 11.5–15.5)
RDW: 13.5 % (ref 11.5–15.5)
WBC: 13.6 10*3/uL — ABNORMAL HIGH (ref 4.0–10.5)
WBC: 16.3 10*3/uL — ABNORMAL HIGH (ref 4.0–10.5)
nRBC: 0 % (ref 0.0–0.2)
nRBC: 0 % (ref 0.0–0.2)

## 2019-03-07 LAB — BASIC METABOLIC PANEL
Anion gap: 12 (ref 5–15)
BUN: 60 mg/dL — ABNORMAL HIGH (ref 6–20)
CO2: 23 mmol/L (ref 22–32)
Calcium: 7.7 mg/dL — ABNORMAL LOW (ref 8.9–10.3)
Chloride: 108 mmol/L (ref 98–111)
Creatinine, Ser: 3.49 mg/dL — ABNORMAL HIGH (ref 0.61–1.24)
GFR calc Af Amer: 21 mL/min — ABNORMAL LOW (ref >60)
GFR calc non Af Amer: 18 mL/min — ABNORMAL LOW (ref >60)
Glucose, Bld: 303 mg/dL — ABNORMAL HIGH (ref 70–99)
Potassium: 3.7 mmol/L (ref 3.5–5.1)
Sodium: 143 mmol/L (ref 135–145)

## 2019-03-07 LAB — HEMOGLOBIN A1C
Hgb A1c MFr Bld: 5.7 % — ABNORMAL HIGH (ref 4.8–5.6)
Mean Plasma Glucose: 116.89 mg/dL

## 2019-03-07 LAB — HIV ANTIBODY (ROUTINE TESTING W REFLEX): HIV Screen 4th Generation wRfx: NONREACTIVE

## 2019-03-07 LAB — PROCALCITONIN: Procalcitonin: 45.18 ng/mL

## 2019-03-07 LAB — TROPONIN I (HIGH SENSITIVITY): Troponin I (High Sensitivity): 280 ng/L (ref <18)

## 2019-03-07 LAB — CREATININE, SERUM
Creatinine, Ser: 3.27 mg/dL — ABNORMAL HIGH (ref 0.61–1.24)
GFR calc Af Amer: 23 mL/min — ABNORMAL LOW (ref >60)
GFR calc non Af Amer: 19 mL/min — ABNORMAL LOW (ref >60)

## 2019-03-07 LAB — SARS CORONAVIRUS 2 (TAT 6-24 HRS): SARS Coronavirus 2: NEGATIVE

## 2019-03-07 LAB — PROTIME-INR
INR: 1.3 — ABNORMAL HIGH (ref 0.8–1.2)
Prothrombin Time: 15.6 seconds — ABNORMAL HIGH (ref 11.4–15.2)

## 2019-03-07 LAB — CORTISOL-AM, BLOOD: Cortisol - AM: 30.2 ug/dL — ABNORMAL HIGH (ref 6.7–22.6)

## 2019-03-07 LAB — PHENYTOIN LEVEL, TOTAL: Phenytoin Lvl: 11.6 ug/mL (ref 10.0–20.0)

## 2019-03-07 LAB — MRSA PCR SCREENING: MRSA by PCR: NEGATIVE

## 2019-03-07 MED ORDER — INSULIN ASPART 100 UNIT/ML ~~LOC~~ SOLN
0.0000 [IU] | SUBCUTANEOUS | Status: DC
  Administered 2019-03-07: 3 [IU] via SUBCUTANEOUS
  Administered 2019-03-07: 7 [IU] via SUBCUTANEOUS
  Administered 2019-03-07: 3 [IU] via SUBCUTANEOUS
  Filled 2019-03-07 (×3): qty 1

## 2019-03-07 MED ORDER — INSULIN ASPART 100 UNIT/ML ~~LOC~~ SOLN
0.0000 [IU] | Freq: Three times a day (TID) | SUBCUTANEOUS | Status: DC
  Administered 2019-03-07 – 2019-03-09 (×6): 2 [IU] via SUBCUTANEOUS
  Administered 2019-03-10 – 2019-03-12 (×2): 1 [IU] via SUBCUTANEOUS
  Administered 2019-03-13: 2 [IU] via SUBCUTANEOUS
  Administered 2019-03-13 – 2019-03-14 (×2): 1 [IU] via SUBCUTANEOUS
  Administered 2019-03-14: 2 [IU] via SUBCUTANEOUS
  Filled 2019-03-07 (×12): qty 1

## 2019-03-07 MED ORDER — INSULIN ASPART 100 UNIT/ML ~~LOC~~ SOLN: 0.0000 [IU] | Freq: Every day | SUBCUTANEOUS | Status: DC

## 2019-03-07 MED ORDER — CLONAZEPAM 1 MG PO TABS
1.0000 mg | ORAL_TABLET | Freq: Every day | ORAL | Status: DC
  Administered 2019-03-07 – 2019-03-14 (×8): 1 mg via ORAL
  Filled 2019-03-07 (×8): qty 1

## 2019-03-07 MED ORDER — INSULIN DETEMIR 100 UNIT/ML ~~LOC~~ SOLN
20.0000 [IU] | Freq: Every day | SUBCUTANEOUS | Status: DC
  Administered 2019-03-07 – 2019-03-12 (×6): 20 [IU] via SUBCUTANEOUS
  Filled 2019-03-07 (×8): qty 0.2

## 2019-03-07 MED ORDER — SODIUM CHLORIDE 0.9 % IV BOLUS
1000.0000 mL | Freq: Once | INTRAVENOUS | Status: AC
  Administered 2019-03-07: 1000 mL via INTRAVENOUS

## 2019-03-07 MED ORDER — NYSTATIN 100000 UNIT/ML MT SUSP
5.0000 mL | Freq: Four times a day (QID) | OROMUCOSAL | Status: DC
  Administered 2019-03-07 – 2019-03-15 (×28): 500000 [IU] via ORAL
  Filled 2019-03-07 (×29): qty 5

## 2019-03-07 MED ORDER — SODIUM CHLORIDE 0.9 % IV SOLN
1.0000 g | Freq: Two times a day (BID) | INTRAVENOUS | Status: DC
  Administered 2019-03-07 – 2019-03-08 (×4): 1 g via INTRAVENOUS
  Filled 2019-03-07 (×6): qty 1

## 2019-03-07 MED ORDER — METOPROLOL TARTRATE 25 MG PO TABS
12.5000 mg | ORAL_TABLET | Freq: Every day | ORAL | Status: DC
  Administered 2019-03-07 – 2019-03-15 (×9): 12.5 mg via ORAL
  Filled 2019-03-07 (×9): qty 1

## 2019-03-07 MED ORDER — CHLORHEXIDINE GLUCONATE CLOTH 2 % EX PADS: 6.0000 | MEDICATED_PAD | Freq: Every day | CUTANEOUS | Status: DC

## 2019-03-07 MED ORDER — CHLORHEXIDINE GLUCONATE CLOTH 2 % EX PADS
6.0000 | MEDICATED_PAD | Freq: Every day | CUTANEOUS | Status: DC
  Administered 2019-03-08 – 2019-03-14 (×8): 6 via TOPICAL

## 2019-03-07 MED ORDER — TRAMADOL HCL 50 MG PO TABS
50.0000 mg | ORAL_TABLET | Freq: Two times a day (BID) | ORAL | Status: DC | PRN
  Administered 2019-03-13: 50 mg via ORAL
  Filled 2019-03-07: qty 1

## 2019-03-07 NOTE — Progress Notes (Signed)
PHARMACY - PHYSICIAN COMMUNICATION CRITICAL VALUE ALERT - BLOOD CULTURE IDENTIFICATION (BCID)  Jason Reed is an 58 y.o. male who presented to [facility] on 03/06/2019 with a chief complaint of AMS s/t UTI  Assessment:  4/4 SIRS + LA 2.9 >> 2.3, UA leukocytes +, bacteria few, WBC > 50,  4/4 E. Coli KPC -   Name of physician (or Provider) Contacted: Eugenie Norrie  Current antibiotics: Ceftriaxone 1g IV daily  Changes to prescribed antibiotics recommended:  Recommendations accepted by provider -- Will switch to meropenem 1g IV q12h per CrCl > 25 - < 50 ml/min and will continue to monitor to renal fx and s/sx of infx.  Results for orders placed or performed during the hospital encounter of 03/06/19  Blood Culture ID Panel (Reflexed) (Collected: 03/06/2019 12:06 PM)  Result Value Ref Range   Enterococcus species NOT DETECTED NOT DETECTED   Listeria monocytogenes NOT DETECTED NOT DETECTED   Staphylococcus species NOT DETECTED NOT DETECTED   Staphylococcus aureus (BCID) NOT DETECTED NOT DETECTED   Streptococcus species NOT DETECTED NOT DETECTED   Streptococcus agalactiae NOT DETECTED NOT DETECTED   Streptococcus pneumoniae NOT DETECTED NOT DETECTED   Streptococcus pyogenes NOT DETECTED NOT DETECTED   Acinetobacter baumannii NOT DETECTED NOT DETECTED   Enterobacteriaceae species DETECTED (A) NOT DETECTED   Enterobacter cloacae complex NOT DETECTED NOT DETECTED   Escherichia coli DETECTED (A) NOT DETECTED   Klebsiella oxytoca NOT DETECTED NOT DETECTED   Klebsiella pneumoniae NOT DETECTED NOT DETECTED   Proteus species NOT DETECTED NOT DETECTED   Serratia marcescens NOT DETECTED NOT DETECTED   Carbapenem resistance NOT DETECTED NOT DETECTED   Haemophilus influenzae NOT DETECTED NOT DETECTED   Neisseria meningitidis NOT DETECTED NOT DETECTED   Pseudomonas aeruginosa NOT DETECTED NOT DETECTED   Candida albicans NOT DETECTED NOT DETECTED   Candida glabrata NOT DETECTED NOT DETECTED   Candida krusei NOT DETECTED NOT DETECTED   Candida parapsilosis NOT DETECTED NOT DETECTED   Candida tropicalis NOT DETECTED NOT DETECTED   Tobie Lords, PharmD, BCPS Clinical Pharmacist 03/07/2019  1:16 AM

## 2019-03-07 NOTE — Consult Note (Signed)
Name: Jason Reed MRN: [id number] DOB: [birth year]    ADMISSION DATE:  03/06/2019 CONSULTATION DATE: 03/07/2019  REFERRING MD : Dr. Maryland Pink   CHIEF COMPLAINT: Altered mental status   BRIEF PATIENT DESCRIPTION:  58 yo male admitted with acute encephalopathy secondary to ecoli/ enterobacteriaceae bacteremia in the setting of UTI   SIGNIFICANT EVENTS/STUDIES:  11/2: Pt admitted to ICU with acute encephalopathy  11/2: US renal revealed mild dilation of the left intrarenal collecting system. This may be physiologic. Mild hydronephrosis from partial obstruction is possible, and should be considered if there are consistent clinical findings. No other left kidney abnormality. Status post right nephrectomy. Small diverticulum containing debris from the right posterior bladder.  HISTORY OF PRESENT ILLNESS:   This is a 58 yo male with a PMH of Seizures, Psychosis, Hyperlipidemia, HTN, and Type II Diabetes Mellitus.  He presented to [facility] ER on 11/2 via EMS from [facility] with confusion ("not acting like himself" according to staff at facility), weakness, and fever.  Per group home facility staff the pt had similar symptoms when diagnosed with a UTI in the past.  In the ER pt lethargic, CXR and COVID-19 negative.  Lab results revealed glucose 483, BUN 57, creatinine 3.12, anion gap 18, AST 73, ALT 50, lactic acid 2.9, wbc 17.3, hgb 10.9, and platelets 136.  UA positive for UTI. He received 2L NS bolus and ceftriaxone. However blood cultures positive x2 for ecoli and enterobacterriaceae, therefore ceftriaxone discontinued and meropenem started. Pt subsequently admitted to ICU per hospitalist team for additional workup and treatment.    PAST MEDICAL HISTORY :   has a past medical history of Diabetes ([place]), HTN (hypertension), Hyperlipidemia, Psychosis ([place]), and Seizure ([place]).  has a past surgical history that includes Colonoscopy with propofol (N/A, 12/22/2018);  Esophagogastroduodenoscopy (egd) with propofol (N/A, 12/22/2018); and Colonoscopy with propofol (N/A, 12/23/2018). Prior to Admission medications   Medication Sig Start Date End Date Taking? Authorizing Provider  acetaminophen (TYLENOL) 325 MG tablet Take 650 mg by mouth every 6 (six) hours as needed.   Yes [provider]  ARIPiprazole (ABILIFY) 15 MG tablet Take 15 mg by mouth daily.   Yes [provider]  aspirin EC 81 MG tablet Take 81 mg by mouth daily.   Yes [provider]  clonazePAM (KLONOPIN) 1 MG tablet Take 1 mg by mouth at bedtime.    Yes [provider]  gabapentin (NEURONTIN) 600 MG tablet Take 600 mg by mouth 3 (three) times daily.  10/25/18  Yes [provider]  guaiFENesin-dextromethorphan (ROBITUSSIN DM) 100-10 MG/5ML syrup Take 10 mLs by mouth every 4 (four) hours as needed for cough.   Yes [provider]  hydrochlorothiazide (HYDRODIURIL) 25 MG tablet Take 25 mg by mouth daily.   Yes [provider]  hydrocortisone cream 1 % Apply 1 application topically 2 (two) times daily as needed.    Yes [provider]  loratadine (CLARITIN) 10 MG tablet Take 10 mg by mouth daily.   Yes [provider]  losartan (COZAAR) 100 MG tablet Take 100 mg by mouth daily.   Yes [provider]  metFORMIN (GLUCOPHAGE) 500 MG tablet Take 500 mg by mouth 2 (two) times daily with a meal.   Yes [provider]  metoprolol succinate (TOPROL-XL) 25 MG 24 hr tablet Take 25 mg by mouth daily.   Yes [provider]  mirtazapine (REMERON SOL-TAB) 15 MG disintegrating tablet Take 15 mg by mouth at bedtime.  Yes [provider]  phenytoin (DILANTIN) 100 MG ER capsule Take 100-300 mg by mouth 2 (two) times daily. Take 100 mg by mouth in the morning and 300 mg by mouth at bedtime.   Yes [provider]  psyllium (METAMUCIL) 58.6 % powder Take 1 packet by mouth daily as needed.    Yes  [provider]  QUEtiapine (SEROQUEL) 400 MG tablet Take 800 mg by mouth at bedtime.   Yes [provider]  simvastatin (ZOCOR) 20 MG tablet Take 20 mg by mouth at bedtime.   Yes [provider]  traZODone (DESYREL) 50 MG tablet Take 50 mg by mouth at bedtime.   Yes [provider]   Allergies  Allergen Reactions  . Lisinopril     Unknown    FAMILY HISTORY:  family history is not on file. SOCIAL HISTORY:  reports that he has never smoked. He has never used smokeless tobacco. He reports previous drug use. He reports that he does not drink alcohol.  REVIEW OF SYSTEMS:   Unable to assess pt lethargic   SUBJECTIVE:  Unable to assess pt lethargic   VITAL SIGNS: Temp:  [98.5 F (36.9 C)-104.2 F (40.1 C)] 98.5 F (36.9 C) (11/03 0148) Pulse Rate:  [108-139] 113 (11/03 0100) Resp:  [0-37] 13 (11/03 0100) BP: (88-188)/(49-136) 88/51 (11/03 0100) SpO2:  [73 %-98 %] 94 % (11/03 0100) Weight:  [108.9 kg] 108.9 kg (11/02 1000)  PHYSICAL EXAMINATION: General: acutely ill appearing male, NAD  Neuro: lethargic, PERRL, follows commands  HEENT: very dry oral mucous membranes, oral thrush, no JVD  Cardiovascular: sinus rhythm with depressed t wave, no R/G  Lungs: diminished throughout, even, non labored  Abdomen: hypoactive BS x4, obese, soft, non tender, non distended  Musculoskeletal: normal bulk and tone, no edema Skin: intact no rashes or lesions present   Recent Labs  Lab 03/06/19 1043 03/06/19 2339  NA 136  --   K 4.0  --   CL 96*  --   CO2 22  --   BUN 57*  --   CREATININE 3.12* 3.27*  GLUCOSE 483*  --    Recent Labs  Lab 03/06/19 1043 03/06/19 2339  HGB 10.9* 9.3*  HCT 34.4* 29.7*  WBC 17.3* 16.3*  PLT 136* 106*   Dg Chest 2 View  Result Date: 03/06/2019 CLINICAL DATA:  Altered mental status EXAM: CHEST - 2 VIEW COMPARISON:  January 08, 2017 FINDINGS: No edema or consolidation. Heart size and pulmonary vascularity are  normal. No adenopathy. There is degenerative change in thoracic spine. IMPRESSION: No edema or consolidation. Electronically Signed   By: Lowella Grip III M.D.   On: 03/06/2019 11:06   US Renal  Result Date: 03/06/2019 CLINICAL DATA:  Acute kidney injury. History of a right nephrectomy. EXAM: RENAL / URINARY TRACT ULTRASOUND COMPLETE COMPARISON:  CT, 01/08/2017 FINDINGS: Right Kidney: Surgically absent.  No mass in the right renal fossa. Left Kidney: Renal measurements: 13.1 x 6.6 x 6.4 cm = volume: 290 mL. Normal parenchymal echogenicity. Mild dilation of the intrarenal collecting system. No mass or stone. Bladder: Small outpouching from the right posterior bladder, which may be at the right ureteral trigone. This contain some apparent debris. No other bladder abnormality. Other: None. IMPRESSION: 1. Mild dilation of the left intrarenal collecting system. This may be physiologic. Mild hydronephrosis from partial obstruction is possible, and should be considered if there are consistent clinical findings. No other left kidney abnormality. 2. Status post right nephrectomy.  3. Small diverticulum containing debris from the right posterior bladder. Electronically Signed   By: Lajean Manes M.D.   On: 03/06/2019 15:57    ASSESSMENT / PLAN:  Hypotension secondary to sepsis  Hx: HTN and HLD  Continuous telemetry monitoring  Aggressive fluid resuscitation to maintain map >65, if pt remains hypotensive will start neo-synephrine gtt  Hold outpatient antihypertensives Restart outpatient simvastatin and aspirin once pt able to tolerate po's  Cortisol level pending   Acute on chronic renal failure  Lactic acidosis-improving  Trend BMP and lactic acid  Replace electrolytes as indicated  Monitor UOP Avoid nephrotoxic medications   Sepsis secondary to ecoli/enterobacter bacteremia  Urosepsis  Oral thrush  Trend WBC and monitor fever curve  Trend PCT Follow cultures  Continue meropenem  Will start  po nystatin   Anemia without obvious acute blood loss  Chronic thrombocytopenia  VTE px: subq lovenox  Trend CBC   Monitor for s/sx of bleeding and transfuse for hgb <7  Type II diabetes mellitus  CBG q4hrs  SSI and scheduled levemir   Acute encephalopathy likely secondary to sepsis  Hx: seizures and psychosis  Avoid sedating medication for now  Frequent orientation  Keep NPO until mentation improves  Continue dilantin  ABG pending  If mentation does not improve will order CT Head   Marda Stalker, [place] Pager [phone] (please enter 7 digits) PCCM Consult Pager [phone] (please enter 7 digits)

## 2019-03-07 NOTE — Progress Notes (Signed)
Pharmacy Electrolyte Monitoring Consult:  Pharmacy consulted to assist in monitoring and replacing electrolytes in this 58 y.o. male admitted on 03/06/2019 with Ecoli bacteremia/UTI.  Labs:  Sodium (mmol/L)  Date Value  03/07/2019 143   Potassium (mmol/L)  Date Value  03/07/2019 3.7   Magnesium (mg/dL)  Date Value  01/11/2017 1.8   Phosphorus (mg/dL)  Date Value  01/11/2017 2.5   Calcium (mg/dL)  Date Value  03/07/2019 7.7 (L)   Albumin (g/dL)  Date Value  03/06/2019 3.5    Assessment/Plan: Electrolytes:  No replacement warranted. Will obtain follow up BMP/Mag with am labs. Will continue to monitor calcium closely and replace as warranted.   Glucose: per diabetes team recommendation; will increase detemir to 20 units Qhs. Patient with diet, will transition SSI to TIDWM/Qhs.   Pharmacy will continue to monitor and adjust per consult.   Helena Sardo L 03/07/2019 4:57 PM

## 2019-03-07 NOTE — Progress Notes (Signed)
   03/07/19 1757  MEWS Score  Pulse Rate (!) 115  BP 131/66  MEWS Score  MEWS RR 0  MEWS Pulse 2  MEWS Systolic 0  MEWS LOC 0  MEWS Temp 0  MEWS Score 2  MEWS Score Color Yellow  MEWS Assessment  Is this an acute change? No (MD made aware)  Patient transferred to room 113 from ICU, resting comfortably in bed with no c/o pain.  Dr. Lanney Gins made aware that patient's HR had been in the 112-115 range for most of the day.  Patient normally takes metoprolol 25 mg PO daily and has not received today.  Verbal order to start metoprolol 12.5 mg PO now received from Dr. Lanney Gins.  Clarise Cruz, BSN

## 2019-03-07 NOTE — Progress Notes (Signed)
Pt received from ER to rm #7 via stretcher. A & O to self. Drowsy, lethargic, follows command with delayed response. VS stable, no skin breakdown. Denies pain ; PI V x 2 with NS bolus infusing. Will continue to monitor pt closely.

## 2019-03-07 NOTE — Progress Notes (Signed)
Inpatient Diabetes Program Recommendations  AACE/ADA: New Consensus Statement on Inpatient Glycemic Control  Target Ranges:  Prepandial:   less than 140 mg/dL      Peak postprandial:   less than 180 mg/dL (1-2 hours)      Critically ill patients:  140 - 180 mg/dL  Results for Jason Reed, Jason Reed (MRN [id number]) as of 03/07/2019 08:42  Ref. Range 03/06/2019 23:44 03/07/2019 02:07 03/07/2019 03:53 03/07/2019 07:31  Glucose-Capillary Latest Ref Range: 70 - 99 mg/dL 352 (H) 325 (H) 337 (H) 236 (H)   Results for [name], [name] (MRN [id number]) as of 03/07/2019 08:42  Ref. Range 03/06/2019 10:43 03/06/2019 23:39 03/07/2019 06:25  Glucose Latest Ref Range: 70 - 99 mg/dL 483 (H)  303 (H)  Hemoglobin A1C Latest Ref Range: 4.8 - 5.6 %  5.7 (H)     Review of Glycemic Control  Diabetes history: DM2 Outpatient Diabetes medications: Metformin 500 mg BID Current orders for Inpatient glycemic control: Levemir 15 units QHS, Novolog 0-9 units Q4H  Inpatient Diabetes Program Recommendations:   Insulin-Basal: Please consider increasing Levemir to 20 units QHS.  HbgA1C: A1C 5.7% on 03/06/19 indicating an average glucose of 117 mg/dl over the past 2-3 months. Hemoblogin 9.3 g/dL on 03/06/19 when A1C was drawn. Current A1C does not correlate with noted glucose trends since admitted.   Thanks, Barnie Alderman, RN, MSN, CDE Diabetes Coordinator Inpatient Diabetes Program [phone] (Team Pager from 8am to 5pm)

## 2019-03-08 DIAGNOSIS — A419 Sepsis, unspecified organism: Secondary | ICD-10-CM

## 2019-03-08 DIAGNOSIS — E0822 Diabetes mellitus due to underlying condition with diabetic chronic kidney disease: Secondary | ICD-10-CM

## 2019-03-08 DIAGNOSIS — R6520 Severe sepsis without septic shock: Secondary | ICD-10-CM

## 2019-03-08 DIAGNOSIS — G40909 Epilepsy, unspecified, not intractable, without status epilepticus: Secondary | ICD-10-CM

## 2019-03-08 DIAGNOSIS — N182 Chronic kidney disease, stage 2 (mild): Secondary | ICD-10-CM

## 2019-03-08 DIAGNOSIS — Z794 Long term (current) use of insulin: Secondary | ICD-10-CM

## 2019-03-08 DIAGNOSIS — E785 Hyperlipidemia, unspecified: Secondary | ICD-10-CM

## 2019-03-08 DIAGNOSIS — N179 Acute kidney failure, unspecified: Secondary | ICD-10-CM

## 2019-03-08 LAB — PROCALCITONIN: Procalcitonin: 39.57 ng/mL

## 2019-03-08 LAB — URINE CULTURE: Culture: >=100000 — AB

## 2019-03-08 LAB — PHENYTOIN LEVEL, FREE AND TOTAL
Phenytoin, Free: 1.2 ug/mL (ref 1.0–2.0)
Phenytoin, Total: 9.7 ug/mL — ABNORMAL LOW (ref 10.0–20.0)

## 2019-03-08 LAB — GLUCOSE, CAPILLARY
Glucose-Capillary: 144 mg/dL — ABNORMAL HIGH (ref 70–99)
Glucose-Capillary: 180 mg/dL — ABNORMAL HIGH (ref 70–99)
Glucose-Capillary: 166 mg/dL — ABNORMAL HIGH (ref 70–99)
Glucose-Capillary: 185 mg/dL — ABNORMAL HIGH (ref 70–99)

## 2019-03-08 MED ORDER — SODIUM CHLORIDE 0.9 % IV SOLN
INTRAVENOUS | Status: DC
  Administered 2019-03-08 – 2019-03-10 (×5): via INTRAVENOUS

## 2019-03-08 MED ORDER — SODIUM CHLORIDE 0.9 % IV SOLN
2.0000 g | INTRAVENOUS | Status: AC
  Administered 2019-03-08 – 2019-03-10 (×3): 2 g via INTRAVENOUS
  Filled 2019-03-08 (×3): qty 2

## 2019-03-08 NOTE — Progress Notes (Signed)
   03/08/19 1432  Vitals  Temp (!) 101.5 F (38.6 C)  Temp Source Oral  BP 124/71  MAP (mmHg) 96  BP Location Right Arm  BP Method Automatic  Patient Position (if appropriate) Sitting  Pulse Rate (!) 116  Pulse Rate Source Dinamap  Resp 18  Mews change yellow to red, however not acute change. Notified MD Pate at 1442. administer Tylenol

## 2019-03-08 NOTE — Progress Notes (Signed)
[place] [facility] [place] NAME: Jason Reed    MR#:  [id number]  DATE OF BIRTH:  1961-03-16  SUBJECTIVE:    REVIEW OF SYSTEMS:   ROS Tolerating Diet: Tolerating PT:   DRUG ALLERGIES:   Allergies  Allergen Reactions  . Lisinopril     Unknown    VITALS:  Blood pressure 106/68, pulse (!) 109, temperature 98.3 F (36.8 C), temperature source Oral, resp. rate 17, height 6\' 1"  (1.854 m), weight 115.4 kg, SpO2 100 %.  PHYSICAL EXAMINATION:   Physical Exam  GENERAL:  58 y.o.-year-old patient lying in the bed with no acute distress.  EYES: Pupils equal, round, reactive to light and accommodation. No scleral icterus. Extraocular muscles intact.  HEENT: Head atraumatic, normocephalic. Oropharynx and nasopharynx clear.  NECK:  Supple, no jugular venous distention. No thyroid enlargement, no tenderness.  LUNGS: Normal breath sounds bilaterally, no wheezing, rales, rhonchi. No use of accessory muscles of respiration.  CARDIOVASCULAR: S1, S2 normal. No murmurs, rubs, or gallops.  ABDOMEN: Soft, nontender, nondistended. Bowel sounds present. No organomegaly or mass.  EXTREMITIES: No cyanosis, clubbing or edema b/l.    NEUROLOGIC: Cranial nerves II through XII are intact. No focal Motor or sensory deficits b/l.   PSYCHIATRIC:  patient is alert and oriented x 3.  SKIN: No obvious rash, lesion, or ulcer.   LABORATORY PANEL:  CBC Recent Labs  Lab 03/07/19 0625  WBC 13.6*  HGB 9.1*  HCT 29.1*  PLT 108*    Chemistries  Recent Labs  Lab 03/06/19 1043  03/07/19 0625  NA 136  --  143  K 4.0  --  3.7  CL 96*  --  108  CO2 22  --  23  GLUCOSE 483*  --  303*  BUN 57*  --  60*  CREATININE 3.12*   < > 3.49*  CALCIUM 8.5*  --  7.7*  AST 73*  --   --   ALT 50*  --   --   ALKPHOS 79  --   --   BILITOT 1.6*  --   --    < > = values in this interval not displayed.   Cardiac Enzymes No results for input(s): TROPONINI in the last 168  hours. RADIOLOGY:  US Renal  Result Date: 03/06/2019 CLINICAL DATA:  Acute kidney injury. History of a right nephrectomy. EXAM: RENAL / URINARY TRACT ULTRASOUND COMPLETE COMPARISON:  CT, 01/08/2017 FINDINGS: Right Kidney: Surgically absent.  No mass in the right renal fossa. Left Kidney: Renal measurements: 13.1 x 6.6 x 6.4 cm = volume: 290 mL. Normal parenchymal echogenicity. Mild dilation of the intrarenal collecting system. No mass or stone. Bladder: Small outpouching from the right posterior bladder, which may be at the right ureteral trigone. This contain some apparent debris. No other bladder abnormality. Other: None. IMPRESSION: 1. Mild dilation of the left intrarenal collecting system. This may be physiologic. Mild hydronephrosis from partial obstruction is possible, and should be considered if there are consistent clinical findings. No other left kidney abnormality. 2. Status post right nephrectomy. 3. Small diverticulum containing debris from the right posterior bladder. Electronically Signed   By: Lajean Manes M.D.   On: 03/06/2019 15:57   ASSESSMENT AND PLAN:  Jason Reed is a 58 y.o. male with medical history significant for diabetes mellitus, seizure disorder and psychosis who lives in a group home who was sent over today, 11/2, with reports that he had not been acting  well, with difficulty walking and reported fever.  Reportedly confused.    * E coli Sepsis ([place]) secondary to acute lower UTI with AKI due to ATN with sepsis -Patient meets criteria for sepsis on admission given lactic acidosis, acute kidney injury, tachycardia and leukocytosis.   -Blood cultures Ecoli -  Has received IV antibiotics and aggressive IV fluid resuscitation.  -  Reviewed renal ultrasound, no evidence of the right obstruction. -IV rocephin according to UC C/s -elevate procalcitonin  *AKI (acute kidney injury) ([place]) in the setting of stage II chronic kidney disease: Secondary to sepsis.  Aggressive fluid  resuscitation, follow labs. -Baseline creatinine around 1.5 -came in with creatinine of 3.49--- 3.62 -cont IV fluids -nephrology to see patient--dr Zollie Scale -avoid nephrotoxic agents  *Diabetes mellitus due to underlying condition with stage 2 chronic kidney disease, with long-term current use of insulin ([place]): - Sliding scale and have ordered subcu Lantus at a decreased dose.  -A1c 5.7%   *Obesity (BMI 30-39.9): Patient meets criteria with BMI greater than 30.   * chronic Psychosis [facility]): Continuing home medications.   *chronic  Seizure disorder [facility]): Continue Dilantin, check Dilantin level--11.6  *discharge disposition to group home when medically stable  Case discussed with Care Management/Social Worker.  CODE STATUS: Full  DVT Prophylaxis: lovenox  TOTAL TIME TAKING CARE OF THIS PATIENT: *30* minutes.  >50% time spent on counselling and coordination of care  POSSIBLE D/C IN 2-3 DAYS, DEPENDING ON CLINICAL CONDITION.  Note: This dictation was prepared with Dragon dictation along with smaller phrase technology. Any transcriptional errors that result from this process are unintentional.  Fritzi Mandes M.D on 03/08/2019 at 2:17 PM  Between 7am to 6pm - Pager - [phone]  After 6pm go to [email or web address] - password [email or web address] [facility]   CC: Primary care physician; Marguerita Merles, MDPatient ID: Jason Reed, male   DOB: Apr 09, 1961, 58 y.o.   MRN: [id number]

## 2019-03-09 ENCOUNTER — Inpatient Hospital Stay: Payer: Medicare Other

## 2019-03-09 DIAGNOSIS — N390 Urinary tract infection, site not specified: Secondary | ICD-10-CM

## 2019-03-09 LAB — BLOOD GAS, VENOUS
Acid-base deficit: 0.4 mmol/L (ref 0.0–2.0)
Bicarbonate: 25.9 mmol/L (ref 20.0–28.0)
O2 Saturation: 9.9 %
Patient temperature: 37
pCO2, Ven: 48 mmHg (ref 44.0–60.0)
pH, Ven: 7.34 (ref 7.250–7.430)

## 2019-03-09 LAB — GLUCOSE, CAPILLARY
Glucose-Capillary: 162 mg/dL — ABNORMAL HIGH (ref 70–99)
Glucose-Capillary: 151 mg/dL — ABNORMAL HIGH (ref 70–99)
Glucose-Capillary: 151 mg/dL — ABNORMAL HIGH (ref 70–99)
Glucose-Capillary: 160 mg/dL — ABNORMAL HIGH (ref 70–99)

## 2019-03-09 LAB — PROCALCITONIN: Procalcitonin: 20.89 ng/mL

## 2019-03-09 NOTE — Care Management Important Message (Signed)
Important Message  Patient Details  Name: Martavion Beeck MRN: [id number] Date of Birth: [birth year]   Medicare Important Message Given:  Yes     Juliann Pulse A Giovanny Dugal 03/09/2019, 11:20 AM

## 2019-03-09 NOTE — Evaluation (Signed)
Physical Therapy Evaluation Patient Details Name: Jason Reed MRN: [id number] DOB: 11-Dec-1960 Today's Date: 03/09/2019   History of Present Illness  pt is a 58 yo male who presented to ED from group home for confusion, difficulty walking and fever, found to have mild UTI, PMH includes DM, seizure disorder, psychosis  Clinical Impression  Pt is a 58 yo male admitted for above. Pt in bed upon arrival agreeable to participate with PT. Pt presents with decreased cognition. Pt able to state name and birthday. Pt followed one step commands consistently with increased time but unable to follow multi step commands. Pt had difficulty performed LE therex secondary to decreased cognition. Pt had episode of fecal incontinence and condom catheter had been removed. Pt required max A x2 for rolling each side for self care. Pt required Max A to maintain sidelying positioning. Pt appeared to be very fatigued throughout session with minimal active participate. Per nursing pt had just returned from testing and he normally participates more. Pt reported living with other people, independent with ADLs and ambulating without AD which is accurate per chart review. Pt presents with decreased strength, cognition and safety awareness limiting functional mobility. Pt will benefit from skilled acute PT to further assess mobility when he is more awake and alert and improve deficits. Currently pt needs 24/7 supervision especially for OOB mobility for safety.     Follow Up Recommendations SNF    Equipment Recommendations  Other (comment)(TBD)    Recommendations for Other Services       Precautions / Restrictions Precautions Precautions: Fall Restrictions Weight Bearing Restrictions: No      Mobility  Bed Mobility Overal bed mobility: Needs Assistance Bed Mobility: Rolling Rolling: Max assist;+2 for physical assistance         General bed mobility comments: pt required max A to participate in self care after  fecal incontinence episode in bed, max A to maintain in sidelying position  Transfers                 General transfer comment: deferred at this time  Ambulation/Gait                Stairs            Wheelchair Mobility    Modified Rankin (Stroke Patients Only)       Balance                                             Pertinent Vitals/Pain Pain Assessment: No/denies pain    Home Living Family/patient expects to be discharged to:: Group home                      Prior Function Level of Independence: Independent         Comments: pt chart review and pt independent with all ADLs, denies any falls in last year, pt reports having 5 steps to enter group home, walk in shower, regular height toilet, grab bars by shower and toilet and no DME     Hand Dominance        Extremity/Trunk Assessment   Upper Extremity Assessment Upper Extremity Assessment: Generalized weakness;Difficult to assess due to impaired cognition    Lower Extremity Assessment Lower Extremity Assessment: Generalized weakness;Difficult to assess due to impaired cognition       Communication   Communication: Expressive difficulties  Cognition Arousal/Alertness: Lethargic Behavior During Therapy: WFL for tasks assessed/performed Overall Cognitive Status: History of cognitive impairments - at baseline                                 General Comments: pt follows one step commands consistently with increased time, does not follow multi step commands consistently      General Comments      Exercises Total Joint Exercises Ankle Circles/Pumps: AROM;Both;10 reps Quad Sets: AROM;Both;5 reps Short Arc Quad: AROM;Both;10 reps Heel Slides: AROM;Both;10 reps Hip ABduction/ADduction: AROM;Both;10 reps   Assessment/Plan    PT Assessment Patient needs continued PT services  PT Problem List Decreased strength;Decreased mobility;Decreased  safety awareness;Decreased range of motion;Decreased activity tolerance;Decreased balance;Decreased knowledge of use of DME;Decreased cognition       PT Treatment Interventions DME instruction;Therapeutic exercise;Gait training;Balance training;Stair training;Neuromuscular re-education;Functional mobility training;Therapeutic activities;Patient/family education    PT Goals (Current goals can be found in the Care Plan section)  Acute Rehab PT Goals PT Goal Formulation: Patient unable to participate in goal setting    Frequency Min 2X/week   Barriers to discharge        Co-evaluation               AM-PAC PT "6 Clicks" Mobility  Outcome Measure Help needed turning from your back to your side while in a flat bed without using bedrails?: Total Help needed moving from lying on your back to sitting on the side of a flat bed without using bedrails?: Total Help needed moving to and from a bed to a chair (including a wheelchair)?: Total Help needed standing up from a chair using your arms (e.g., wheelchair or bedside chair)?: Total Help needed to walk in hospital room?: Total Help needed climbing 3-5 steps with a railing? : Total 6 Click Score: 6    End of Session   Activity Tolerance: Patient limited by fatigue Patient left: in bed;with call bell/phone within reach;with nursing/sitter in room Nurse Communication: Mobility status PT Visit Diagnosis: Muscle weakness (generalized) (M62.81);Difficulty in walking, not elsewhere classified (R26.2);Other abnormalities of gait and mobility (R26.89)    Time: [id number] PT Time Calculation (min) (ACUTE ONLY): 31 min   Charges:   PT Evaluation $PT Eval Moderate Complexity: 1 Mod         Jason Reed PT, DPT 1:26 PM,03/09/19 [phone]

## 2019-03-09 NOTE — TOC Initial Note (Signed)
Transition of Care [facility]) - Initial/Assessment Note    Patient Details  Name: Jason Reed MRN: [id number] Date of Birth: 1960-12-09  Transition of Care [facility]) CM/SW Contact:    Shelbie Hutching, RN Phone Number: 03/09/2019, 9:07 AM  Clinical Narrative:                 Patient admitted with sepsis from UTI.  Patient is from [place] and [employer] [facility].  Contact person at the group home is Tawana Scale [phone].  Patient is independent in ADL's and requires no assistive devices.  When patient is ready for discharge Clara will come and pick patient up.  Patient is current with PCP Dr. Delight Stare and pharmacy is [facility] in [place]- they deliver to the group home.  RNCM will follow for needs.   Expected Discharge Plan: Group Home Barriers to Discharge: Continued Medical Work up   Patient Goals and CMS Choice        Expected Discharge Plan and Services Expected Discharge Plan: Group Home   Discharge Planning Services: CM Consult   Living arrangements for the past 2 months: Group Home                                      Prior Living Arrangements/Services Living arrangements for the past 2 months: Group Home Lives with:: Facility Resident Patient language and need for interpreter reviewed:: Yes Do you feel safe going back to the place where you live?: Yes      Need for Family Participation in Patient Care: Yes (Comment) Care giver support system in place?: Yes (comment)(group home)   Criminal Activity/Legal Involvement Pertinent to Current Situation/Hospitalization: No - Comment as needed  Activities of Daily Living      Permission Sought/Granted Permission sought to share information with : Case Manager, Customer service manager Permission granted to share information with : Yes, Verbal Permission Granted     Permission granted to share info w AGENCY: [facility]        Emotional Assessment Appearance:: Appears stated  age     Orientation: : Oriented to Self Alcohol / Substance Use: Not Applicable Psych Involvement: No (comment)  Admission diagnosis:  SIRS (systemic inflammatory response syndrome) ([place]) [R65.10] Patient Active Problem List   Diagnosis Date Noted  . Hyperlipidemia   . Sepsis ([place])   . Acute lower UTI   . 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 AKI (acute kidney injury) ([place])   . Obesity (BMI 30-39.9)   . Psychosis ([place])   . Seizure disorder ([place])   . Rectal polyp   . Ectopic gastric tissue   . Stomach irritation   . Loss of weight   . Colon cancer screening   . Edema of colon   . Septic shock ([place]) 01/08/2017   PCP:  Marguerita Merles, MD Pharmacy:   [place], [place]. [place]  [address] Phone: [phone] Fax: [phone]     Social Determinants of Health (SDOH) Interventions    Readmission Risk Interventions No flowsheet data found.

## 2019-03-09 NOTE — Progress Notes (Signed)
[place] at [place] NAME: Jason Reed    MR#:  [id number]  DATE OF BIRTH:  Apr 14, 1961  SUBJECTIVE:   Patient feels a whole lot better. He is eating better. Drinking fluids. Feels more alert.  At baseline he walks. REVIEW OF SYSTEMS:   Review of Systems  Constitutional: Negative for chills, fever and weight loss.  HENT: Negative for ear discharge, ear pain and nosebleeds.   Eyes: Negative for blurred vision, pain and discharge.  Respiratory: Negative for sputum production, shortness of breath, wheezing and stridor.   Cardiovascular: Negative for chest pain, palpitations, orthopnea and PND.  Gastrointestinal: Negative for abdominal pain, diarrhea, nausea and vomiting.  Genitourinary: Negative for frequency and urgency.  Musculoskeletal: Negative for back pain and joint pain.  Neurological: Positive for weakness. Negative for sensory change, speech change and focal weakness.  Psychiatric/Behavioral: Negative for depression and hallucinations. The patient is not nervous/anxious.    Tolerating Diet: yes Tolerating PT: recommends rehab  DRUG ALLERGIES:   Allergies  Allergen Reactions  . Lisinopril     Unknown    VITALS:  Blood pressure (!) 147/76, pulse (!) 111, temperature 99.2 F (37.3 C), temperature source Oral, resp. rate 19, height 6\' 1"  (1.854 m), weight 115.4 kg, SpO2 99 %.  PHYSICAL EXAMINATION:   Physical Exam  GENERAL:  58 y.o.-year-old patient lying in the bed with no acute distress.  EYES: Pupils equal, round, reactive to light and accommodation. No scleral icterus. Extraocular muscles intact.  HEENT: Head atraumatic, normocephalic. Oropharynx and nasopharynx clear.  NECK:  Supple, no jugular venous distention. No thyroid enlargement, no tenderness.  LUNGS: Normal breath sounds bilaterally, no wheezing, rales, rhonchi. No use of accessory muscles of respiration.  CARDIOVASCULAR: S1, S2 normal. No murmurs, rubs, or  gallops.  ABDOMEN: Soft, nontender, nondistended. Bowel sounds present. No organomegaly or mass.  EXTREMITIES: No cyanosis, clubbing or edema b/l.    NEUROLOGIC: Cranial nerves II through XII are intact. No focal Motor or sensory deficits b/l. Overall weak PSYCHIATRIC:  patient is alert and awake. SKIN: No obvious rash, lesion, or ulcer.   LABORATORY PANEL:  CBC Recent Labs  Lab 03/07/19 0625  WBC 13.6*  HGB 9.1*  HCT 29.1*  PLT 108*    Chemistries  Recent Labs  Lab 03/06/19 1043  03/07/19 0625  NA 136  --  143  K 4.0  --  3.7  CL 96*  --  108  CO2 22  --  23  GLUCOSE 483*  --  303*  BUN 57*  --  60*  CREATININE 3.12*   < > 3.49*  CALCIUM 8.5*  --  7.7*  AST 73*  --   --   ALT 50*  --   --   ALKPHOS 79  --   --   BILITOT 1.6*  --   --    < > = values in this interval not displayed.   Cardiac Enzymes No results for input(s): TROPONINI in the last 168 hours. RADIOLOGY:  Ct Renal Stone Study  Result Date: 03/09/2019 CLINICAL DATA:  Hydronephrosis. EXAM: CT ABDOMEN AND PELVIS WITHOUT CONTRAST TECHNIQUE: Multidetector CT imaging of the abdomen and pelvis was performed following the standard protocol without IV contrast. COMPARISON:  01/08/2017 FINDINGS: Lower chest: Basilar atelectasis bilaterally. Hepatobiliary: The liver shows diffusely decreased attenuation suggesting fat deposition. Tiny hypodensity anterior liver on 21/2 is similar to prior. Gallbladder not visualized and may be surgically absent. No intrahepatic or  extrahepatic biliary dilation. Pancreas: No focal mass lesion. No dilatation of the main duct. No intraparenchymal cyst. No peripancreatic edema. Spleen: No splenomegaly. No focal mass lesion. Adrenals/Urinary Tract: No adrenal nodule or mass. Absent right kidney. Mild to moderate left hydroureteronephrosis. Left kidney demonstrates duplicated collecting system with atrophy of the upper pole moiety. High attenuation material in the upper pole collecting system  may represent stones/stone debris. This is not well evaluated on noncontrast imaging. There is perinephric and proximal periureteric edema/stranding. Posterior bladder wall irregularity evident. Stomach/Bowel: Stomach is decompressed. Duodenum is normally positioned as is the ligament of Treitz. No small bowel wall thickening. No small bowel dilatation. Colon is diffusely dilated with air and stool, similar to prior. Vascular/Lymphatic: No abdominal aortic aneurysm. No abdominal lymphadenopathy No pelvic sidewall lymphadenopathy. Reproductive: The prostate gland and seminal vesicles are unremarkable. Other: No intraperitoneal free fluid. Musculoskeletal: No worrisome lytic or sclerotic osseous abnormality. IMPRESSION: 1. Mild to moderate left hydroureteronephrosis with perinephric and periureteric edema/stranding. 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 High attenuation material in the left upper pole collecting system may represent stones/stone debris and is stable in the interval since prior study. 2. Absent right kidney. 3. Diffuse colonic distention with air and stool, similar to prior. Colonic dysmotility would be a consideration. 4. Hepatic steatosis. Electronically Signed   By: Jason Reed M.D.   On: 03/09/2019 12:12   ASSESSMENT AND PLAN:  Jason Reed is a 58 y.o. male with medical history significant for diabetes mellitus, seizure disorder and psychosis who lives in a group home who was sent over today, 11/2, with reports that he had not been acting well, with difficulty walking and reported fever.  Reportedly confused.    * E coli Sepsis ([place]) secondary to acute lower UTI with AKI due to ATN with sepsis -Patient meets criteria for sepsis on admission given lactic acidosis, acute kidney injury, tachycardia and leukocytosis.   -Blood cultures + Ecoli -  cont aggressive IV fluid resuscitation.  -  Reviewed renal ultrasound, no evidence of the right obstruction. -CT  renal study shows Mild to moderate left hydroureteronephrosis with perinephric and periureteric edema/stranding. 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  Aggressive fluid resuscitation, follow labs. 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  -A1c 5.7%   *Obesity (BMI 30-39.9): Patient meets criteria with BMI greater than 30.   * chronic Psychosis [facility]): Continuing home medications.   *chronic  Seizure disorder [facility]): Continue Dilantin, check Dilantin level--11.6  *discharge disposition -- physical therapy recommends rehab. Social worker consulted   Case discussed with Care Management/Social Worker.  CODE STATUS: Full  DVT Prophylaxis: lovenox  TOTAL TIME TAKING CARE OF THIS PATIENT: *30* minutes.  >50% time spent on counselling and coordination of care  POSSIBLE D/C IN few DAYS, DEPENDING ON CLINICAL CONDITION.  Note: This dictation was prepared with Dragon dictation along with smaller phrase technology. Any transcriptional errors that result from this process are unintentional.  Fritzi Mandes M.D on 03/09/2019 at 2:06 PM  Between 7am to 6pm - Pager - [phone]  After 6pm go to [email or web address] - password [email or web address]   CC: Primary care physician; Marguerita Merles, MDPatient ID: Jason Reed, male   DOB: [birth year], 58 y.o.   MRN: [id number]

## 2019-03-09 NOTE — Consult Note (Signed)
[facility] CONSULT NOTE    Date: 03/09/2019                  Patient Name:  Jason Reed  MRN: [id number]  DOB: Jan 08, 1961  Age / Sex: 58 y.o., male         PCP: Marguerita Merles, MD                 Service Requesting Consult:  Hospitalist                 Reason for Consult:  Acute renal failure            History of Present Illness: Patient is a 58 y.o. male with a PMHx of diabetes mellitus type 2, hypertension, hyperlipidemia, seizure disorder, chronic kidney disease stage II, who was admitted to [facility] on 03/06/2019 for evaluation of confusion.  Patient is a rather poor historian as he has history of psychosis.  It appears that he was having difficulty with ambulation and also had a fever.  Patient found to have urinary tract infection.  He was tested for COVID-19 and was negative.  His baseline creatinine appears to be 1.29 with an EGFR greater than 60.  Patient was also noted to be on HCTZ at home.  He is currently receiving IV fluid hydration.  In addition to this he is being treated with ceftriaxone for underlying urinary tract infection.   Medications: Outpatient medications: Medications Prior to Admission  Medication Sig Dispense Refill Last Dose  . acetaminophen (TYLENOL) 325 MG tablet Take 650 mg by mouth every 6 (six) hours as needed.   prn at prn  . ARIPiprazole (ABILIFY) 15 MG tablet Take 15 mg by mouth daily.   03/06/2019 at 0800  . aspirin EC 81 MG tablet Take 81 mg by mouth daily.   03/06/2019 at 0800  . clonazePAM (KLONOPIN) 1 MG tablet Take 1 mg by mouth at bedtime.    03/05/2019 at Unknown time  . gabapentin (NEURONTIN) 600 MG tablet Take 600 mg by mouth 3 (three) times daily.    03/06/2019 at 0800  . guaiFENesin-dextromethorphan (ROBITUSSIN DM) 100-10 MG/5ML syrup Take 10 mLs by mouth every 4 (four) hours as needed for cough.   prn at prn  . hydrochlorothiazide (HYDRODIURIL) 25 MG tablet Take 25 mg by mouth daily.   03/06/2019 at 0800  .  hydrocortisone cream 1 % Apply 1 application topically 2 (two) times daily as needed.    prn at prn  . loratadine (CLARITIN) 10 MG tablet Take 10 mg by mouth daily.   03/06/2019 at 0800  . losartan (COZAAR) 100 MG tablet Take 100 mg by mouth daily.   03/06/2019 at 0800  . metFORMIN (GLUCOPHAGE) 500 MG tablet Take 500 mg by mouth 2 (two) times daily with a meal.   03/06/2019 at 0800  . metoprolol succinate (TOPROL-XL) 25 MG 24 hr tablet Take 25 mg by mouth daily.   03/06/2019 at 0800  . mirtazapine (REMERON SOL-TAB) 15 MG disintegrating tablet Take 15 mg by mouth at bedtime.   03/05/2019 at Unknown time  . phenytoin (DILANTIN) 100 MG ER capsule Take 100-300 mg by mouth 2 (two) times daily. Take 100 mg by mouth in the morning and 300 mg by mouth at bedtime.   03/06/2019 at 0800  . psyllium (METAMUCIL) 58.6 % powder Take 1 packet by mouth daily as needed.    prn at prn  . QUEtiapine (SEROQUEL) 400 MG tablet Take  800 mg by mouth at bedtime.   03/05/2019 at Unknown time  . simvastatin (ZOCOR) 20 MG tablet Take 20 mg by mouth at bedtime.   03/05/2019 at Unknown time  . traZODone (DESYREL) 50 MG tablet Take 50 mg by mouth at bedtime.   03/05/2019 at Unknown time    Current medications: Current Facility-Administered Medications  Medication Dose Route Frequency Provider Last Rate Last Dose  . 0.9 %  sodium chloride infusion   Intravenous Continuous Fritzi Mandes, MD 100 mL/hr at 03/09/19 [phone]    . acetaminophen (TYLENOL) tablet 650 mg  650 mg Oral Q6H PRN Annita Brod, MD   650 mg at 03/09/19 0456   Or  . acetaminophen (TYLENOL) suppository 650 mg  650 mg Rectal Q6H PRN Annita Brod, MD   650 mg at 03/06/19 1911  . ARIPiprazole (ABILIFY) tablet 15 mg  15 mg Oral Daily Annita Brod, MD   15 mg at 03/09/19 0946  . cefTRIAXone (ROCEPHIN) 2 g in sodium chloride 0.9 % 100 mL IVPB  2 g Intravenous Q24H Fritzi Mandes, MD   Stopped at 03/08/19 2101  . Chlorhexidine Gluconate Cloth 2 % PADS 6 each  6 each  Topical Daily Ottie Glazier, MD   6 each at 03/08/19 2242  . clonazePAM (KLONOPIN) tablet 1 mg  1 mg Oral QHS Ottie Glazier, MD   1 mg at 03/08/19 2106  . enoxaparin (LOVENOX) injection 40 mg  40 mg Subcutaneous Q24H Annita Brod, MD   40 mg at 03/08/19 2106  . insulin aspart (novoLOG) injection 0-5 Units  0-5 Units Subcutaneous QHS Charlett Nose, RPH      . insulin aspart (novoLOG) injection 0-9 Units  0-9 Units Subcutaneous TID WC Charlett Nose, RPH   2 Units at 03/09/19 0946  . insulin detemir (LEVEMIR) injection 20 Units  20 Units Subcutaneous QHS Ottie Glazier, MD   20 Units at 03/08/19 2106  . metoprolol tartrate (LOPRESSOR) tablet 12.5 mg  12.5 mg Oral Daily Aleskerov, Fuad, MD   12.5 mg at 03/09/19 0945  . nystatin (MYCOSTATIN) 100000 UNIT/ML suspension 500,000 Units  5 mL Oral QID Awilda Bill, NP   500,000 Units at 03/09/19 0945  . ondansetron (ZOFRAN) tablet 4 mg  4 mg Oral Q6H PRN Annita Brod, MD       Or  . ondansetron [facility]) injection 4 mg  4 mg Intravenous Q6H PRN Annita Brod, MD      . phenytoin (DILANTIN) ER capsule 100 mg  100 mg Oral Daily Annita Brod, MD   100 mg at 03/09/19 0946  . phenytoin (DILANTIN) ER capsule 300 mg  300 mg Oral QHS Annita Brod, MD   300 mg at 03/08/19 2106  . polyethylene glycol (MIRALAX / GLYCOLAX) packet 17 g  17 g Oral Daily PRN Annita Brod, MD      . QUEtiapine (SEROQUEL) tablet 800 mg  800 mg Oral QHS Annita Brod, MD   800 mg at 03/08/19 2105  . traMADol (ULTRAM) tablet 50 mg  50 mg Oral Q12H PRN Annita Brod, MD          Allergies: Allergies  Allergen Reactions  . Lisinopril     Unknown      Past Medical History: Past Medical History:  Diagnosis Date  . Diabetes ([place])   . HTN (hypertension)   . Hyperlipidemia   . Psychosis ([place])   . Seizure ([place])  Past Surgical History: Past Surgical History:  Procedure Laterality Date  . COLONOSCOPY WITH PROPOFOL  N/A 12/22/2018   Procedure: COLONOSCOPY WITH PROPOFOL;  Surgeon: Virgel Manifold, MD;  Location: [facility] ENDOSCOPY;  Service: Endoscopy;  Laterality: N/A;  . COLONOSCOPY WITH PROPOFOL N/A 12/23/2018   Procedure: COLONOSCOPY WITH PROPOFOL;  Surgeon: Virgel Manifold, MD;  Location: [facility] ENDOSCOPY;  Service: Endoscopy;  Laterality: N/A;  . ESOPHAGOGASTRODUODENOSCOPY (EGD) WITH PROPOFOL N/A 12/22/2018   Procedure: ESOPHAGOGASTRODUODENOSCOPY (EGD) WITH PROPOFOL;  Surgeon: Virgel Manifold, MD;  Location: [facility] ENDOSCOPY;  Service: Endoscopy;  Laterality: N/A;     Family History: No family history on file.   Social History: Social History   Socioeconomic History  . Marital status: Single    Spouse name: Not on file  . Number of children: Not on file  . Years of education: Not on file  . Highest education level: Not on file  Occupational History  . Not on file  Social Needs  . Financial resource strain: Not on file  . Food insecurity    Worry: Not on file    Inability: Not on file  . Transportation needs    Medical: Not on file    Non-medical: Not on file  Tobacco Use  . Smoking status: Never Smoker  . Smokeless tobacco: Never Used  Substance and Sexual Activity  . Alcohol use: No  . Drug use: Not Currently  . Sexual activity: Not on file  Lifestyle  . Physical activity    Days per week: Not on file    Minutes per session: Not on file  . Stress: Not on file  Relationships  . Social Herbalist on phone: Not on file    Gets together: Not on file    Attends religious service: Not on file    Active member of club or organization: Not on file    Attends meetings of clubs or organizations: Not on file    Relationship status: Not on file  . Intimate partner violence    Fear of current or ex partner: Not on file    Emotionally abused: Not on file    Physically abused: Not on file    Forced sexual activity: Not on file  Other Topics Concern  . Not on file   Social History Narrative  . Not on file     Review of Systems: Patient is a poor historian unable to offer accurate review of systems.  Vital Signs: Blood pressure (!) 147/76, pulse (!) 111, temperature 99.2 F (37.3 C), temperature source Oral, resp. rate 19, height '6\' 1"'  (1.854 m), weight 115.4 kg, SpO2 99 %.  Weight trends: Filed Weights   03/06/19 1000 03/07/19 0200 03/07/19 1709  Weight: 108.9 kg 114.6 kg 115.4 kg    Physical Exam: General: NAD, resting in bed  Head: Normocephalic, atraumatic.  Eyes: Anicteric, EOMI  Nose: Mucous membranes moist, not inflammed, nonerythematous.  Throat: Oropharynx nonerythematous, no exudate appreciated.   Neck: Supple, trachea midline.  Lungs:  Normal respiratory effort. Clear to auscultation BL without crackles or wheezes.  Heart: RRR. S1 and S2 normal without gallop, murmur, or rubs.  Abdomen:  BS normoactive. Soft, Nondistended, non-tender.  No masses or organomegaly.  Extremities: No pretibial edema.  Neurologic: Awake, alert, will follow simple commands  Skin: No visible rashes, scars.    Lab results: Basic Metabolic Panel: Recent Labs  Lab 03/06/19 1043 03/06/19 2339 03/07/19 0625  NA 136  --  143  K 4.0  --  3.7  CL 96*  --  108  CO2 22  --  23  GLUCOSE 483*  --  303*  BUN 57*  --  60*  CREATININE 3.12* 3.27* 3.49*  CALCIUM 8.5*  --  7.7*    Liver Function Tests: Recent Labs  Lab 03/06/19 1043  AST 73*  ALT 50*  ALKPHOS 79  BILITOT 1.6*  PROT 7.7  ALBUMIN 3.5   No results for input(s): LIPASE, AMYLASE in the last 168 hours. No results for input(s): AMMONIA in the last 168 hours.  CBC: Recent Labs  Lab 03/06/19 1043 03/06/19 2339 03/07/19 0625  WBC 17.3* 16.3* 13.6*  NEUTROABS 13.7*  --   --   HGB 10.9* 9.3* 9.1*  HCT 34.4* 29.7* 29.1*  MCV 91.7 90.8 91.5  PLT 136* 106* 108*    Cardiac Enzymes: No results for input(s): CKTOTAL, CKMB, CKMBINDEX, TROPONINI in the last 168  hours.  BNP: Invalid input(s): POCBNP  CBG: Recent Labs  Lab 03/08/19 0829 03/08/19 1153 03/08/19 1710 03/08/19 2056 03/09/19 0756  GLUCAP 144* 180* 166* 185* 162*    Microbiology: Results for orders placed or performed during the hospital encounter of 03/06/19  SARS CORONAVIRUS 2 (TAT 6-24 HRS) Nasopharyngeal Nasopharyngeal Swab     Status: None   Collection Time: 03/06/19 11:11 AM   Specimen: Nasopharyngeal Swab  Result Value Ref Range Status   SARS Coronavirus 2 NEGATIVE NEGATIVE Final    Comment: (NOTE) SARS-CoV-2 target nucleic acids are NOT DETECTED. The SARS-CoV-2 RNA is generally detectable in upper and lower respiratory specimens during the acute phase of infection. Negative results do not preclude SARS-CoV-2 infection, do not rule out co-infections with other pathogens, and should not be used as the sole basis for treatment or other patient management decisions. Negative results must be combined with clinical observations, patient history, and epidemiological information. The expected result is Negative. Fact Sheet for Patients: SugarRoll.be Fact Sheet for Healthcare Providers: https://www.woods-mathews.com/ This test is not yet approved or cleared by the [place] FDA and  has been authorized for detection and/or diagnosis of SARS-CoV-2 by FDA under an Emergency Use Authorization (EUA). This EUA will remain  in effect (meaning this test can be used) for the duration of the COVID-19 declaration under Section 56 4(b)(1) of the Act, 21 U.S.C. section 360bbb-3(b)(1), unless the authorization is terminated or revoked sooner. Performed at [facility], [place] [address], [place], [place] [address]   Urine Culture     Status: Abnormal   Collection Time: 03/06/19 11:11 AM   Specimen: Urine, Random  Result Value Ref Range Status   Specimen Description   Final    URINE, RANDOM Performed at [facility],  [address] [place], [place] [address]    Special Requests   Final    NONE Performed at [facility], [address] [place], [place] [address]    Culture >=100,000 COLONIES/mL ESCHERICHIA COLI (A)  Final   Report Status 03/08/2019 FINAL  Final   Organism ID, Bacteria ESCHERICHIA COLI (A)  Final      Susceptibility   Escherichia coli - MIC*    AMPICILLIN >=32 RESISTANT Resistant     CEFAZOLIN <=4 SENSITIVE Sensitive     CEFTRIAXONE <=1 SENSITIVE Sensitive     CIPROFLOXACIN <=0.25 SENSITIVE Sensitive     GENTAMICIN <=1 SENSITIVE Sensitive     IMIPENEM <=0.25 SENSITIVE Sensitive     NITROFURANTOIN <=16 SENSITIVE Sensitive  TRIMETH/SULFA <=20 SENSITIVE Sensitive     AMPICILLIN/SULBACTAM >=32 RESISTANT Resistant     PIP/TAZO <=4 SENSITIVE Sensitive     Extended ESBL NEGATIVE Sensitive     * >=100,000 COLONIES/mL ESCHERICHIA COLI  Blood Culture (routine x 2)     Status: None (Preliminary result)   Collection Time: 03/06/19 12:06 PM   Specimen: BLOOD  Result Value Ref Range Status   Specimen Description   Final    BLOOD BLOOD LEFT HAND Performed at [facility], [address], [place], [place] [address]    Special Requests   Final    BOTTLES DRAWN AEROBIC AND ANAEROBIC Blood Culture adequate volume Performed at [facility], [address] [place], [place] [address]    Culture  Setup Time   Final    GRAM NEGATIVE RODS IN BOTH AEROBIC AND ANAEROBIC BOTTLES CRITICAL RESULT CALLED TO, READ BACK BY AND VERIFIED WITH: DAVID BESANTI AT 2355 ON 03/06/19 RWW Performed at [facility], [address], [place], [place] [address]    Culture   Final    Lonell Grandchild NEGATIVE RODS IDENTIFICATION TO FOLLOW Performed at [facility], [place] [address], [place], [place] [address]    Report Status PENDING  Incomplete  Blood Culture (routine x 2)     Status: Abnormal (Preliminary result)   Collection Time: 03/06/19 12:06 PM   Specimen: BLOOD   Result Value Ref Range Status   Specimen Description   Final    BLOOD BLOOD RIGHT HAND Performed at [facility], [address], [place], [place] [address]    Special Requests   Final    BOTTLES DRAWN AEROBIC AND ANAEROBIC Blood Culture adequate volume Performed at [facility], [address] [place], [place] [address]    Culture  Setup Time   Final    GRAM NEGATIVE RODS IN BOTH AEROBIC AND ANAEROBIC BOTTLES CRITICAL RESULT CALLED TO, READ BACK BY AND VERIFIED WITH: [place] AT 2355 ON 03/06/19 RWW    Culture (A)  Final    ESCHERICHIA COLI SUSCEPTIBILITIES TO FOLLOW Performed at [facility], [place] [address], [place], [place] [address]    Report Status PENDING  Incomplete  Blood Culture ID Panel (Reflexed)     Status: Abnormal   Collection Time: 03/06/19 12:06 PM  Result Value Ref Range Status   Enterococcus species NOT DETECTED NOT DETECTED Final   Listeria monocytogenes NOT DETECTED NOT DETECTED Final   Staphylococcus species NOT DETECTED NOT DETECTED Final   Staphylococcus aureus (BCID) NOT DETECTED NOT DETECTED Final   Streptococcus species NOT DETECTED NOT DETECTED Final   Streptococcus agalactiae NOT DETECTED NOT DETECTED Final   Streptococcus pneumoniae NOT DETECTED NOT DETECTED Final   Streptococcus pyogenes NOT DETECTED NOT DETECTED Final   Acinetobacter baumannii NOT DETECTED NOT DETECTED Final   Enterobacteriaceae species DETECTED (A) NOT DETECTED Final    Comment: Enterobacteriaceae represent a large family of gram-negative bacteria, not a single organism. CRITICAL RESULT CALLED TO, READ BACK BY AND VERIFIED WITH: DAVID BESANTI AT 2355 ON 03/06/19 RWW    Enterobacter cloacae complex NOT DETECTED NOT DETECTED Final   Escherichia coli DETECTED (A) NOT DETECTED Final    Comment: CRITICAL RESULT CALLED TO, READ BACK BY AND VERIFIED WITH: DAVID BESANTI AT 2355 ON 03/06/19 RWW    Klebsiella oxytoca NOT DETECTED NOT DETECTED Final    Klebsiella pneumoniae NOT DETECTED NOT DETECTED Final   Proteus species NOT DETECTED NOT DETECTED Final  Serratia marcescens NOT DETECTED NOT DETECTED Final   Carbapenem resistance NOT DETECTED NOT DETECTED Final   Haemophilus influenzae NOT DETECTED NOT DETECTED Final   Neisseria meningitidis NOT DETECTED NOT DETECTED Final   Pseudomonas aeruginosa NOT DETECTED NOT DETECTED Final   Candida albicans NOT DETECTED NOT DETECTED Final   Candida glabrata NOT DETECTED NOT DETECTED Final   Candida krusei NOT DETECTED NOT DETECTED Final   Candida parapsilosis NOT DETECTED NOT DETECTED Final   Candida tropicalis NOT DETECTED NOT DETECTED Final    Comment: Performed at [facility], [address] [place], [place] [address]  MRSA PCR Screening     Status: None   Collection Time: 03/07/19  2:05 AM   Specimen: Nasal Mucosa; Nasopharyngeal  Result Value Ref Range Status   MRSA by PCR NEGATIVE NEGATIVE Final    Comment:        The GeneXpert MRSA Assay (FDA approved for NASAL specimens only), is one component of a comprehensive MRSA colonization surveillance program. It is not intended to diagnose MRSA infection nor to guide or monitor treatment for MRSA infections. Performed at [facility], [address] [place], [place] [address]     Coagulation Studies: Recent Labs    03/07/19 0625  LABPROT 15.6*  INR 1.3*    Urinalysis: No results for input(s): COLORURINE, LABSPEC, PHURINE, GLUCOSEU, HGBUR, BILIRUBINUR, KETONESUR, PROTEINUR, UROBILINOGEN, NITRITE, LEUKOCYTESUR in the last 72 hours.  Invalid input(s): APPERANCEUR    Imaging:  No results found.   Assessment & Plan: Pt is a 58 y.o. male with a PMHx of diabetes mellitus type 2, hypertension, hyperlipidemia, seizure disorder, chronic kidney disease stage II, who was admitted to [facility] on 03/06/2019 for evaluation of confusion.  1.  Acute kidney injury likely secondary to sepsis from UTI, volume depletion,  and use of HCTZ/possible mild left-sided hydronephrosis.  Patient with history of right nephrectomy.  Possible mild left-sided hydronephrosis noted on renal ultrasound.  Proceed with CT scan abdomen and pelvis for further evaluation.  Continue IV fluid hydration for now.  2.  Chronic kidney disease stage II with history of nephrectomy.  Baseline creatinine 1.29.  Agree with stopping HCTZ.  3.  Urinary tract infection.  Patient being treated with ceftriaxone at this time.  4.  Thanks for consultation.

## 2019-03-10 LAB — CULTURE, BLOOD (ROUTINE X 2)
Special Requests: ADEQUATE
Special Requests: ADEQUATE

## 2019-03-10 LAB — RENAL FUNCTION PANEL
Albumin: 2.2 g/dL — ABNORMAL LOW (ref 3.5–5.0)
Anion gap: 10 (ref 5–15)
BUN: 60 mg/dL — ABNORMAL HIGH (ref 6–20)
CO2: 20 mmol/L — ABNORMAL LOW (ref 22–32)
Calcium: 7.9 mg/dL — ABNORMAL LOW (ref 8.9–10.3)
Chloride: 115 mmol/L — ABNORMAL HIGH (ref 98–111)
Creatinine, Ser: 1.93 mg/dL — ABNORMAL HIGH (ref 0.61–1.24)
GFR calc Af Amer: 43 mL/min — ABNORMAL LOW (ref >60)
GFR calc non Af Amer: 37 mL/min — ABNORMAL LOW (ref >60)
Glucose, Bld: 116 mg/dL — ABNORMAL HIGH (ref 70–99)
Phosphorus: 2.8 mg/dL (ref 2.5–4.6)
Potassium: 4.2 mmol/L (ref 3.5–5.1)
Sodium: 145 mmol/L (ref 135–145)

## 2019-03-10 LAB — GLUCOSE, CAPILLARY
Glucose-Capillary: 109 mg/dL — ABNORMAL HIGH (ref 70–99)
Glucose-Capillary: 118 mg/dL — ABNORMAL HIGH (ref 70–99)
Glucose-Capillary: 122 mg/dL — ABNORMAL HIGH (ref 70–99)
Glucose-Capillary: 131 mg/dL — ABNORMAL HIGH (ref 70–99)

## 2019-03-10 MED ORDER — CEPHALEXIN 500 MG PO CAPS
500.0000 mg | ORAL_CAPSULE | Freq: Two times a day (BID) | ORAL | Status: DC
  Administered 2019-03-11: 500 mg via ORAL
  Filled 2019-03-10: qty 1

## 2019-03-10 NOTE — TOC Progression Note (Signed)
Transition of Care [facility]) - Progression Note    Patient Details  Name: Jayc Golaszewski MRN: [id number] Date of Birth: [birth year]  [facility] CM/SW Contact  Shelbie Hutching, RN Phone Number: 03/10/2019, 3:47 PM  Clinical Narrative:    PT worked with patient and recommended SNF.  Patient is from group home where he is normally independent.  RNCM spoke with group home owner Clara, Clara reports that she will call and speak with the patient's brother to discuss SNF.  Clara asks that [facility] call her back on Monday.  PT should work with patient again and hope will be that patient regains some strength to be able to return to the group home with home health services.     Expected Discharge Plan: Group Home Barriers to Discharge: Continued Medical Work up  Expected Discharge Plan and Services Expected Discharge Plan: Group Home   Discharge Planning Services: CM Consult   Living arrangements for the past 2 months: Group Home                                       Social Determinants of Health (SDOH) Interventions    Readmission Risk Interventions No flowsheet data found.

## 2019-03-10 NOTE — Consult Note (Signed)
Urology Consult  I have been asked to see the patient by Dr. Posey Pronto, for evaluation and management of sepsis 2/2 E. coli UTI.  Chief Complaint: AMS, fever, weakness  History of Present Illness: Jason Reed is a 58 y.o. year old male who presented to the ED on 03/06/2019 with complaints of altered mental status, fever, and weakness.  PMH significant for diabetes, hypertension, psychosis, seizure, hyperlipidemia.  He lives in a group home and staff reported change in his normal behaviors consistent with past UTIs.  Admission UA significant for 11-20 RBCs/hpf, >50 WBCs/hpf, and few bacteria. Urine culture with ampicillin-resistant E. coli.  Blood culture also positive for E. coli.  He was admitted for sepsis with lactate 2.9 and AKI with creatinine 3.12.  On antibiotics as below.  CT stone study significant for absent right kidney with mild to moderate left hydroureteronephrosis; both findings stable compared to prior CT from 01/08/2017.  Also with a posterior bladder wall irregularity, further characterized per renal ultrasound as a small diverticulum containing apparent debris.  Creatinine 1.93 today, down from 3.49 3 days ago.  WBC count also downtrending, today 13.6.  2950 mL urinary output yesterday.  Upon questioning, patient reports having a right nephrectomy "a long time ago." He does not know why this was done. He denies other urologic care or procedures. He reports some dysuria prior to his current admission but denies urinary difficulty otherwise.  Anti-infectives (From admission, onward)   Start     Dose/Rate Route Frequency Ordered Stop   03/08/19 2200  cefTRIAXone (ROCEPHIN) 2 g in sodium chloride 0.9 % 100 mL IVPB     2 g 200 mL/hr over 30 Minutes Intravenous Every 24 hours 03/08/19 1301     03/07/19 0115  meropenem (MERREM) 1 g in sodium chloride 0.9 % 100 mL IVPB  Status:  Discontinued     1 g 200 mL/hr over 30 Minutes Intravenous Every 12 hours 03/07/19 0113 03/08/19 1301    03/06/19 1115  cefTRIAXone (ROCEPHIN) 1 g in sodium chloride 0.9 % 100 mL IVPB  Status:  Discontinued     1 g 200 mL/hr over 30 Minutes Intravenous Every 24 hours 03/06/19 1111 03/07/19 0113     Past Medical History:  Diagnosis Date  . Diabetes ([place])   . HTN (hypertension)   . Hyperlipidemia   . Psychosis ([place])   . Seizure [facility])     Past Surgical History:  Procedure Laterality Date  . COLONOSCOPY WITH PROPOFOL N/A 12/22/2018   Procedure: COLONOSCOPY WITH PROPOFOL;  Surgeon: Virgel Manifold, MD;  Location: [facility] ENDOSCOPY;  Service: Endoscopy;  Laterality: N/A;  . COLONOSCOPY WITH PROPOFOL N/A 12/23/2018   Procedure: COLONOSCOPY WITH PROPOFOL;  Surgeon: Virgel Manifold, MD;  Location: [facility] ENDOSCOPY;  Service: Endoscopy;  Laterality: N/A;  . ESOPHAGOGASTRODUODENOSCOPY (EGD) WITH PROPOFOL N/A 12/22/2018   Procedure: ESOPHAGOGASTRODUODENOSCOPY (EGD) WITH PROPOFOL;  Surgeon: Virgel Manifold, MD;  Location: [facility] ENDOSCOPY;  Service: Endoscopy;  Laterality: N/A;    Home Medications:  Current Meds  Medication Sig  . acetaminophen (TYLENOL) 325 MG tablet Take 650 mg by mouth every 6 (six) hours as needed.  . ARIPiprazole (ABILIFY) 15 MG tablet Take 15 mg by mouth daily.  Marland Kitchen aspirin EC 81 MG tablet Take 81 mg by mouth daily.  . clonazePAM (KLONOPIN) 1 MG tablet Take 1 mg by mouth at bedtime.   . gabapentin (NEURONTIN) 600 MG tablet Take 600 mg by mouth 3 (three) times daily.   Marland Kitchen  guaiFENesin-dextromethorphan (ROBITUSSIN DM) 100-10 MG/5ML syrup Take 10 mLs by mouth every 4 (four) hours as needed for cough.  . hydrochlorothiazide (HYDRODIURIL) 25 MG tablet Take 25 mg by mouth daily.  . hydrocortisone cream 1 % Apply 1 application topically 2 (two) times daily as needed.   . loratadine (CLARITIN) 10 MG tablet Take 10 mg by mouth daily.  Marland Kitchen losartan (COZAAR) 100 MG tablet Take 100 mg by mouth daily.  . metFORMIN (GLUCOPHAGE) 500 MG tablet Take 500 mg by mouth 2 (two) times daily  with a meal.  . metoprolol succinate (TOPROL-XL) 25 MG 24 hr tablet Take 25 mg by mouth daily.  . mirtazapine (REMERON SOL-TAB) 15 MG disintegrating tablet Take 15 mg by mouth at bedtime.  . phenytoin (DILANTIN) 100 MG ER capsule Take 100-300 mg by mouth 2 (two) times daily. Take 100 mg by mouth in the morning and 300 mg by mouth at bedtime.  . psyllium (METAMUCIL) 58.6 % powder Take 1 packet by mouth daily as needed.   Marland Kitchen QUEtiapine (SEROQUEL) 400 MG tablet Take 800 mg by mouth at bedtime.  . simvastatin (ZOCOR) 20 MG tablet Take 20 mg by mouth at bedtime.  . traZODone (DESYREL) 50 MG tablet Take 50 mg by mouth at bedtime.    Allergies:  Allergies  Allergen Reactions  . Lisinopril     Unknown    No family history on file.  Social History:  reports that he has never smoked. He has never used smokeless tobacco. He reports previous drug use. He reports that he does not drink alcohol.  ROS: A complete review of systems was performed.  All systems are negative except for pertinent findings as noted.  Physical Exam:  Vital signs in last 24 hours: Temp:  [98 F (36.7 C)-100.3 F (37.9 C)] 98 F (36.7 C) (11/06 0529) Pulse Rate:  [97-100] 100 (11/06 0529) Resp:  [17-18] 17 (11/06 0529) BP: (120-141)/(74-93) 129/74 (11/06 0529) SpO2:  [93 %-100 %] 99 % (11/06 0529) Constitutional:  Alert, no acute distress HEENT: [place] AT, moist mucus membranes Cardiovascular: No clubbing, cyanosis, or edema. Respiratory: Normal respiratory effort GI: Abdomen is soft, nontender, nondistended, no abdominal masses. Large apparent surgical scar at the waist extending from the right mid-axillary line nearly to the umbilicus. Skin: No rashes, bruises or suspicious lesions Neurologic: In bed, unable to assess Psychiatric: Flat affect, delayed speech  Laboratory Data:  CBC    Component Value Date/Time   WBC 13.6 (H) 03/07/2019 0625   RBC 3.18 (L) 03/07/2019 0625   HGB 9.1 (L) 03/07/2019 0625   HCT 29.1  (L) 03/07/2019 0625   HCT 27.6 (L) 01/11/2017 1013   PLT 108 (L) 03/07/2019 0625   MCV 91.5 03/07/2019 0625   MCH 28.6 03/07/2019 0625   MCHC 31.3 03/07/2019 0625   RDW 13.5 03/07/2019 0625   LYMPHSABS 0.9 03/06/2019 1043   MONOABS 1.6 (H) 03/06/2019 1043   EOSABS 0.2 03/06/2019 1043   BASOSABS 0.1 03/06/2019 1043    Recent Labs    03/10/19 0904  NA 145  K 4.2  CL 115*  CO2 20*  GLUCOSE 116*  BUN 60*  CREATININE 1.93*  CALCIUM 7.9*   Urinalysis    Component Value Date/Time   COLORURINE YELLOW (A) 03/06/2019 1017   APPEARANCEUR CLOUDY (A) 03/06/2019 1017   LABSPEC 1.016 03/06/2019 1017   PHURINE 5.0 03/06/2019 1017   GLUCOSEU >=500 (A) 03/06/2019 1017   HGBUR LARGE (A) 03/06/2019 [place] 03/06/2019  1017   KETONESUR 5 (A) 03/06/2019 1017   PROTEINUR 100 (A) 03/06/2019 1017   NITRITE NEGATIVE 03/06/2019 1017   LEUKOCYTESUR SMALL (A) 03/06/2019 1017   Results for orders placed or performed during the hospital encounter of 03/06/19  SARS CORONAVIRUS 2 (TAT 6-24 HRS) Nasopharyngeal Nasopharyngeal Swab     Status: None   Collection Time: 03/06/19 11:11 AM   Specimen: Nasopharyngeal Swab  Result Value Ref Range Status   SARS Coronavirus 2 NEGATIVE NEGATIVE Final    Comment: (NOTE) SARS-CoV-2 target nucleic acids are NOT DETECTED. The SARS-CoV-2 RNA is generally detectable in upper and lower respiratory specimens during the acute phase of infection. Negative results do not preclude SARS-CoV-2 infection, do not rule out co-infections with other pathogens, and should not be used as the sole basis for treatment or other patient management decisions. Negative results must be combined with clinical observations, patient history, and epidemiological information. The expected result is Negative. Fact Sheet for Patients: SugarRoll.be Fact Sheet for Healthcare Providers: https://www.woods-mathews.com/ This test is  not yet approved or cleared by the [place] FDA and  has been authorized for detection and/or diagnosis of SARS-CoV-2 by FDA under an Emergency Use Authorization (EUA). This EUA will remain  in effect (meaning this test can be used) for the duration of the COVID-19 declaration under Section 56 4(b)(1) of the Act, 21 U.S.C. section 360bbb-3(b)(1), unless the authorization is terminated or revoked sooner. Performed at [facility], [place] [address], [place], [place] [address]   Urine Culture     Status: Abnormal   Collection Time: 03/06/19 11:11 AM   Specimen: Urine, Random  Result Value Ref Range Status   Specimen Description   Final    URINE, RANDOM Performed at [facility], [address] [place], [place] [address]    Special Requests   Final    NONE Performed at [facility], [address] [place], [place] [address]    Culture >=100,000 COLONIES/mL ESCHERICHIA COLI (A)  Final   Report Status 03/08/2019 FINAL  Final   Organism ID, Bacteria ESCHERICHIA COLI (A)  Final      Susceptibility   Escherichia coli - MIC*    AMPICILLIN >=32 RESISTANT Resistant     CEFAZOLIN <=4 SENSITIVE Sensitive     CEFTRIAXONE <=1 SENSITIVE Sensitive     CIPROFLOXACIN <=0.25 SENSITIVE Sensitive     GENTAMICIN <=1 SENSITIVE Sensitive     IMIPENEM <=0.25 SENSITIVE Sensitive     NITROFURANTOIN <=16 SENSITIVE Sensitive     TRIMETH/SULFA <=20 SENSITIVE Sensitive     AMPICILLIN/SULBACTAM >=32 RESISTANT Resistant     PIP/TAZO <=4 SENSITIVE Sensitive     Extended ESBL NEGATIVE Sensitive     * >=100,000 COLONIES/mL ESCHERICHIA COLI  Blood Culture (routine x 2)     Status: Abnormal   Collection Time: 03/06/19 12:06 PM   Specimen: BLOOD  Result Value Ref Range Status   Specimen Description   Final    BLOOD BLOOD LEFT HAND Performed at [facility], [address] [place], [place] [address]    Special Requests   Final    BOTTLES DRAWN AEROBIC AND ANAEROBIC  Blood Culture adequate volume Performed at [facility], [address] [place],  [address]    Culture  Setup Time   Final    GRAM NEGATIVE RODS IN BOTH AEROBIC AND ANAEROBIC BOTTLES CRITICAL RESULT CALLED TO, READ BACK BY AND VERIFIED WITH: DAVID BESANTI AT 2355 ON 03/06/19 RWW Performed  at [facility], [address] [place], [place] [address]    Culture (A)  Final    ESCHERICHIA COLI SUSCEPTIBILITIES PERFORMED ON PREVIOUS CULTURE WITHIN THE LAST 5 DAYS. Performed at [facility], [place] [address] [place], [place] [address]    Report Status 03/10/2019 FINAL  Final  Blood Culture (routine x 2)     Status: Abnormal   Collection Time: 03/06/19 12:06 PM   Specimen: BLOOD  Result Value Ref Range Status   Specimen Description   Final    BLOOD BLOOD RIGHT HAND Performed at [facility], [address], [place], [place] [address]    Special Requests   Final    BOTTLES DRAWN AEROBIC AND ANAEROBIC Blood Culture adequate volume Performed at [facility], [address], [place], [place] [address]    Culture  Setup Time   Final    GRAM NEGATIVE RODS IN BOTH AEROBIC AND ANAEROBIC BOTTLES CRITICAL RESULT CALLED TO, READ BACK BY AND VERIFIED WITH: Nelson AT 2355 ON 03/06/19 RWW Performed at [facility], [place] [address] [place], [place] [address]    Culture ESCHERICHIA COLI (A)  Final   Report Status 03/10/2019 FINAL  Final   Organism ID, Bacteria ESCHERICHIA COLI  Final      Susceptibility   Escherichia coli - MIC*    AMPICILLIN >=32 RESISTANT Resistant     CEFAZOLIN <=4 SENSITIVE Sensitive     CEFEPIME <=1 SENSITIVE Sensitive     CEFTAZIDIME <=1 SENSITIVE Sensitive     CEFTRIAXONE <=1 SENSITIVE Sensitive     CIPROFLOXACIN <=0.25 SENSITIVE Sensitive     GENTAMICIN <=1 SENSITIVE Sensitive     IMIPENEM <=0.25 SENSITIVE Sensitive     TRIMETH/SULFA <=20 SENSITIVE Sensitive     AMPICILLIN/SULBACTAM >=32 RESISTANT  Resistant     PIP/TAZO <=4 SENSITIVE Sensitive     Extended ESBL NEGATIVE Sensitive     * ESCHERICHIA COLI  Blood Culture ID Panel (Reflexed)     Status: Abnormal   Collection Time: 03/06/19 12:06 PM  Result Value Ref Range Status   Enterococcus species NOT DETECTED NOT DETECTED Final   Listeria monocytogenes NOT DETECTED NOT DETECTED Final   Staphylococcus species NOT DETECTED NOT DETECTED Final   Staphylococcus aureus (BCID) NOT DETECTED NOT DETECTED Final   Streptococcus species NOT DETECTED NOT DETECTED Final   Streptococcus agalactiae NOT DETECTED NOT DETECTED Final   Streptococcus pneumoniae NOT DETECTED NOT DETECTED Final   Streptococcus pyogenes NOT DETECTED NOT DETECTED Final   Acinetobacter baumannii NOT DETECTED NOT DETECTED Final   Enterobacteriaceae species DETECTED (A) NOT DETECTED Final    Comment: Enterobacteriaceae represent a large family of gram-negative bacteria, not a single organism. CRITICAL RESULT CALLED TO, READ BACK BY AND VERIFIED WITH: DAVID BESANTI AT 2355 ON 03/06/19 RWW    Enterobacter cloacae complex NOT DETECTED NOT DETECTED Final   Escherichia coli DETECTED (A) NOT DETECTED Final    Comment: CRITICAL RESULT CALLED TO, READ BACK BY AND VERIFIED WITH: DAVID BESANTI AT 2355 ON 03/06/19 RWW    Klebsiella oxytoca NOT DETECTED NOT DETECTED Final   Klebsiella pneumoniae NOT DETECTED NOT DETECTED Final   Proteus species NOT DETECTED NOT DETECTED Final   Serratia marcescens NOT DETECTED NOT DETECTED Final   Carbapenem resistance NOT DETECTED NOT DETECTED Final   Haemophilus influenzae NOT DETECTED NOT DETECTED Final   Neisseria meningitidis NOT DETECTED NOT DETECTED Final   Pseudomonas aeruginosa NOT DETECTED NOT DETECTED Final  Candida albicans NOT DETECTED NOT DETECTED Final   Candida glabrata NOT DETECTED NOT DETECTED Final   Candida krusei NOT DETECTED NOT DETECTED Final   Candida parapsilosis NOT DETECTED NOT DETECTED Final   Candida tropicalis NOT  DETECTED NOT DETECTED Final    Comment: Performed at [facility], [address] [place], [place] [address]  MRSA PCR Screening     Status: None   Collection Time: 03/07/19  2:05 AM   Specimen: Nasal Mucosa; Nasopharyngeal  Result Value Ref Range Status   MRSA by PCR NEGATIVE NEGATIVE Final    Comment:        The GeneXpert MRSA Assay (FDA approved for NASAL specimens only), is one component of a comprehensive MRSA colonization surveillance program. It is not intended to diagnose MRSA infection nor to guide or monitor treatment for MRSA infections. Performed at [facility], [address], [place], [place] [address]     Radiologic Imaging: Ct Renal Stone Study  Result Date: 03/09/2019 CLINICAL DATA:  Hydronephrosis. EXAM: CT ABDOMEN AND PELVIS WITHOUT CONTRAST TECHNIQUE: Multidetector CT imaging of the abdomen and pelvis was performed following the standard protocol without IV contrast. COMPARISON:  01/08/2017 FINDINGS: Lower chest: Basilar atelectasis bilaterally. Hepatobiliary: The liver shows diffusely decreased attenuation suggesting fat deposition. Tiny hypodensity anterior liver on 21/2 is similar to prior. Gallbladder not visualized and may be surgically absent. No intrahepatic or extrahepatic biliary dilation. Pancreas: No focal mass lesion. No dilatation of the main duct. No intraparenchymal cyst. No peripancreatic edema. Spleen: No splenomegaly. No focal mass lesion. Adrenals/Urinary Tract: No adrenal nodule or mass. Absent right kidney. Mild to moderate left hydroureteronephrosis. Left kidney demonstrates duplicated collecting system with atrophy of the upper pole moiety. High attenuation material in the upper pole collecting system may represent stones/stone debris. This is not well evaluated on noncontrast imaging. There is perinephric and proximal periureteric edema/stranding. Posterior bladder wall irregularity evident. Stomach/Bowel: Stomach is  decompressed. Duodenum is normally positioned as is the ligament of Treitz. No small bowel wall thickening. No small bowel dilatation. Colon is diffusely dilated with air and stool, similar to prior. Vascular/Lymphatic: No abdominal aortic aneurysm. No abdominal lymphadenopathy No pelvic sidewall lymphadenopathy. Reproductive: The prostate gland and seminal vesicles are unremarkable. Other: No intraperitoneal free fluid. Musculoskeletal: No worrisome lytic or sclerotic osseous abnormality. IMPRESSION: 1. Mild to moderate left hydroureteronephrosis with perinephric and periureteric edema/stranding. 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 High attenuation material in the left upper pole collecting system may represent stones/stone debris and is stable in the interval since prior study. 2. Absent right kidney. 3. Diffuse colonic distention with air and stool, similar to prior. Colonic dysmotility would be a consideration. 4. Hepatic steatosis. Electronically Signed   By: Misty Stanley M.D.   On: 03/09/2019 12:12   I personally reviewed the imaging above and note the absence of the right kidney, mild to moderate left hydroureteronephrosis, and posterior bladder diverticulum.  Assessment & Plan:  58 year old male with PMH diabetes, hypertension, psychosis, seizure, hyperlipidemia admitted with sepsis of urinary source and subsequently found to have E. coli positive urine and blood cultures.  Also with AKI with ATN.  On antibiotics.  Abdominal imaging significant for absent right kidney and mild to moderate left hydroureteronephrosis of unknown origin.  These appear stable compared to CT imaging from 2018.  Patient reports past nephrectomy, however he does not know when, where, or why this was performed.  Abdominal scar suggestive of possible open right nephrectomy.  He denies  knowledge of left hydroureteronephrosis and other urologic care or procedures.  AKI improving following  antibiotics.  Baseline creatinine appears to fall between 1.3 and 1.4, per chart review.  No further intervention warranted, recommend continuing to trend creatinine to baseline.  Given stability of imaging findings, no urgent intervention indicated.  Can consider outpatient follow-up for further evaluation of seemingly chronic left hydroureteronephrosis.  Thank you for involving me in this patient's care, please page with any further questions or concerns.  Debroah Loop, PA-C 03/10/2019 1:51 PM

## 2019-03-10 NOTE — NC FL2 (Signed)
[place] LEVEL OF CARE SCREENING TOOL     IDENTIFICATION  Patient Name: Jason Reed Birthdate: Oct 02, 1960 Sex: male Admission Date (Current Location): 03/06/2019  [place] and [place] Number:  Engineering geologist and Address:  [facility], [address] [place], [place] [address]      Provider Number: [id number]  Attending Physician Name and Address:  Fritzi Mandes, MD  Relative Name and Phone Number:  [name] [phone]    Current Level of Care: Hospital Recommended Level of Care: [place] Prior Approval Number:    Date Approved/Denied:   PASRR Number: [id number]  Discharge Plan: SNF    Current Diagnoses: Patient Active Problem List   Diagnosis Date Noted  . Hyperlipidemia   . Sepsis ([place])   . Acute lower UTI   . 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 AKI (acute kidney injury) ([place])   . Obesity (BMI 30-39.9)   . Psychosis ([place])   . Seizure disorder ([place])   . Rectal polyp   . Ectopic gastric tissue   . Stomach irritation   . Loss of weight   . Colon cancer screening   . Edema of colon   . Septic shock ([place]) 01/08/2017    Orientation RESPIRATION BLADDER Height & Weight     Self  Normal Continent Weight: 115.4 kg Height:  6\' 1"  (185.4 cm)  BEHAVIORAL SYMPTOMS/MOOD NEUROLOGICAL BOWEL NUTRITION STATUS      Incontinent Diet  AMBULATORY STATUS COMMUNICATION OF NEEDS Skin   Extensive Assist Verbally Normal                       Personal Care Assistance Level of Assistance  Bathing, Feeding, Dressing Bathing Assistance: Maximum assistance Feeding assistance: Limited assistance Dressing Assistance: Maximum assistance     Functional Limitations Info             SPECIAL CARE FACTORS FREQUENCY                       Contractures Contractures Info: Not present    Additional Factors  Info  Code Status, Allergies Code Status Info: Full Allergies Info: Lisinopril           Current Medications (03/10/2019):  This is the current hospital active medication list Current Facility-Administered Medications  Medication Dose Route Frequency Provider Last Rate Last Dose  . acetaminophen (TYLENOL) tablet 650 mg  650 mg Oral Q6H PRN Annita Brod, MD   650 mg at 03/09/19 0456   Or  . acetaminophen (TYLENOL) suppository 650 mg  650 mg Rectal Q6H PRN Annita Brod, MD   650 mg at 03/06/19 1911  . ARIPiprazole (ABILIFY) tablet 15 mg  15 mg Oral Daily Annita Brod, MD   15 mg at 03/10/19 1007  . cefTRIAXone (ROCEPHIN) 2 g in sodium chloride 0.9 % 100 mL IVPB  2 g Intravenous Q24H Fritzi Mandes, MD   Stopped at 03/10/19 [phone]  . [START ON 03/11/2019] cephALEXin (KEFLEX) capsule 500 mg  500 mg Oral Q12H Fritzi Mandes, MD      . Chlorhexidine Gluconate Cloth 2 % PADS 6 each  6 each Topical Daily Ottie Glazier, MD   6 each at 03/09/19 2208  . clonazePAM (KLONOPIN) tablet 1 mg  1 mg Oral QHS Ottie Glazier, MD   1 mg at 03/09/19 2011  .  enoxaparin (LOVENOX) injection 40 mg  40 mg Subcutaneous Q24H Annita Brod, MD   40 mg at 03/09/19 2013  . insulin aspart (novoLOG) injection 0-5 Units  0-5 Units Subcutaneous QHS Charlett Nose, RPH      . insulin aspart (novoLOG) injection 0-9 Units  0-9 Units Subcutaneous TID WC Charlett Nose, RPH   2 Units at 03/09/19 1430  . insulin detemir (LEVEMIR) injection 20 Units  20 Units Subcutaneous QHS Ottie Glazier, MD   20 Units at 03/09/19 2149  . metoprolol tartrate (LOPRESSOR) tablet 12.5 mg  12.5 mg Oral Daily Aleskerov, Fuad, MD   12.5 mg at 03/10/19 1007  . nystatin (MYCOSTATIN) 100000 UNIT/ML suspension 500,000 Units  5 mL Oral QID Awilda Bill, NP   500,000 Units at 03/10/19 1343  . ondansetron (ZOFRAN) tablet 4 mg  4 mg Oral Q6H PRN Annita Brod, MD       Or  . ondansetron [facility]) injection 4 mg  4 mg  Intravenous Q6H PRN Annita Brod, MD      . phenytoin (DILANTIN) ER capsule 100 mg  100 mg Oral Daily Annita Brod, MD   100 mg at 03/10/19 1008  . phenytoin (DILANTIN) ER capsule 300 mg  300 mg Oral QHS Annita Brod, MD   300 mg at 03/09/19 2012  . polyethylene glycol (MIRALAX / GLYCOLAX) packet 17 g  17 g Oral Daily PRN Annita Brod, MD      . QUEtiapine (SEROQUEL) tablet 800 mg  800 mg Oral QHS Annita Brod, MD   800 mg at 03/09/19 2012  . traMADol (ULTRAM) tablet 50 mg  50 mg Oral Q12H PRN Annita Brod, MD         Discharge Medications: Please see discharge summary for a list of discharge medications.  Relevant Imaging Results:  Relevant Lab Results:   Additional Information SSN [id number]  Shelbie Hutching, RN

## 2019-03-10 NOTE — Progress Notes (Signed)
Pharmacy Electrolyte Monitoring Consult:  Pharmacy consulted to assist in monitoring and replacing electrolytes in this 58 y.o. male admitted on 03/06/2019 with Ecoli bacteremia/UTI.  Labs:  Sodium (mmol/L)  Date Value  03/10/2019 145   Potassium (mmol/L)  Date Value  03/10/2019 4.2   Magnesium (mg/dL)  Date Value  01/11/2017 1.8   Phosphorus (mg/dL)  Date Value  03/10/2019 2.8   Calcium (mg/dL)  Date Value  03/10/2019 7.9 (L)   Albumin (g/dL)  Date Value  03/10/2019 2.2 (L)    Assessment/Plan: Electrolytes have remained stable.  Pharmacy will sign off of consult. MD to follow labs as clinically indicated.  Tawnya Crook, PharmD 03/10/2019 2:06 PM

## 2019-03-10 NOTE — Progress Notes (Signed)
[facility]  ROUNDING NOTE   Subjective:  Patient with good urine output over the preceding 24 hours. Urine output was 2.9 L. Still awaiting repeat renal parameters today. Patient much more awake and alert today.   Objective:  Vital signs in last 24 hours:  Temp:  [98 F (36.7 C)-100.3 F (37.9 C)] 98 F (36.7 C) (11/06 0529) Pulse Rate:  [97-100] 100 (11/06 0529) Resp:  [17-18] 17 (11/06 0529) BP: (120-141)/(74-93) 129/74 (11/06 0529) SpO2:  [93 %-100 %] 99 % (11/06 0529)  Weight change:  Filed Weights   03/06/19 1000 03/07/19 0200 03/07/19 1709  Weight: 108.9 kg 114.6 kg 115.4 kg    Intake/Output: I/O last 3 completed shifts: In: 1441.9 [P.O.:120; I.V.:1221.9; IV Piggyback:100] Out: 5750 [Urine:5750]   Intake/Output this shift:  No intake/output data recorded.  Physical Exam: General: No acute distress  Head: Normocephalic, atraumatic. Moist oral mucosal membranes  Eyes: Anicteric  Neck: Supple, trachea midline  Lungs:  Clear to auscultation, normal effort  Heart: S1S2 no rubs  Abdomen:  Soft, nontender, bowel sounds present  Extremities: No peripheral edema.  Neurologic: Awake, alert, following commands  Skin: No lesions       Basic Metabolic Panel: Recent Labs  Lab 03/06/19 1043 03/06/19 2339 03/07/19 0625  NA 136  --  143  K 4.0  --  3.7  CL 96*  --  108  CO2 22  --  23  GLUCOSE 483*  --  303*  BUN 57*  --  60*  CREATININE 3.12* 3.27* 3.49*  CALCIUM 8.5*  --  7.7*    Liver Function Tests: Recent Labs  Lab 03/06/19 1043  AST 73*  ALT 50*  ALKPHOS 79  BILITOT 1.6*  PROT 7.7  ALBUMIN 3.5   No results for input(s): LIPASE, AMYLASE in the last 168 hours. No results for input(s): AMMONIA in the last 168 hours.  CBC: Recent Labs  Lab 03/06/19 1043 03/06/19 2339 03/07/19 0625  WBC 17.3* 16.3* 13.6*  NEUTROABS 13.7*  --   --   HGB 10.9* 9.3* 9.1*  HCT 34.4* 29.7* 29.1*  MCV 91.7 90.8 91.5  PLT 136* 106* 108*     Cardiac Enzymes: No results for input(s): CKTOTAL, CKMB, CKMBINDEX, TROPONINI in the last 168 hours.  BNP: Invalid input(s): POCBNP  CBG: Recent Labs  Lab 03/09/19 0756 03/09/19 1341 03/09/19 1651 03/09/19 2134 03/10/19 0814  GLUCAP 162* 151* 151* 160* 109*    Microbiology: Results for orders placed or performed during the hospital encounter of 03/06/19  SARS CORONAVIRUS 2 (TAT 6-24 HRS) Nasopharyngeal Nasopharyngeal Swab     Status: None   Collection Time: 03/06/19 11:11 AM   Specimen: Nasopharyngeal Swab  Result Value Ref Range Status   SARS Coronavirus 2 NEGATIVE NEGATIVE Final    Comment: (NOTE) SARS-CoV-2 target nucleic acids are NOT DETECTED. The SARS-CoV-2 RNA is generally detectable in upper and lower respiratory specimens during the acute phase of infection. Negative results do not preclude SARS-CoV-2 infection, do not rule out co-infections with other pathogens, and should not be used as the sole basis for treatment or other patient management decisions. Negative results must be combined with clinical observations, patient history, and epidemiological information. The expected result is Negative. Fact Sheet for Patients: SugarRoll.be Fact Sheet for Healthcare Providers: https://www.woods-mathews.com/ This test is not yet approved or cleared by the [place] FDA and  has been authorized for detection and/or diagnosis of SARS-CoV-2 by FDA under an Emergency Use Authorization (EUA). This  EUA will remain  in effect (meaning this test can be used) for the duration of the COVID-19 declaration under Section 56 4(b)(1) of the Act, 21 U.S.C. section 360bbb-3(b)(1), unless the authorization is terminated or revoked sooner. Performed at [facility], [place] [address] [place], [place] [address]   Urine Culture     Status: Abnormal   Collection Time: 03/06/19 11:11 AM   Specimen: Urine, Random  Result Value Ref  Range Status   Specimen Description   Final    URINE, RANDOM Performed at [facility], [address] [place], [place] [address]    Special Requests   Final    NONE Performed at [facility], [address] [place], [place] [address]    Culture >=100,000 COLONIES/mL ESCHERICHIA COLI (A)  Final   Report Status 03/08/2019 FINAL  Final   Organism ID, Bacteria ESCHERICHIA COLI (A)  Final      Susceptibility   Escherichia coli - MIC*    AMPICILLIN >=32 RESISTANT Resistant     CEFAZOLIN <=4 SENSITIVE Sensitive     CEFTRIAXONE <=1 SENSITIVE Sensitive     CIPROFLOXACIN <=0.25 SENSITIVE Sensitive     GENTAMICIN <=1 SENSITIVE Sensitive     IMIPENEM <=0.25 SENSITIVE Sensitive     NITROFURANTOIN <=16 SENSITIVE Sensitive     TRIMETH/SULFA <=20 SENSITIVE Sensitive     AMPICILLIN/SULBACTAM >=32 RESISTANT Resistant     PIP/TAZO <=4 SENSITIVE Sensitive     Extended ESBL NEGATIVE Sensitive     * >=100,000 COLONIES/mL ESCHERICHIA COLI  Blood Culture (routine x 2)     Status: Abnormal   Collection Time: 03/06/19 12:06 PM   Specimen: BLOOD  Result Value Ref Range Status   Specimen Description   Final    BLOOD BLOOD LEFT HAND Performed at [facility], [address] [place], [place] [address]    Special Requests   Final    BOTTLES DRAWN AEROBIC AND ANAEROBIC Blood Culture adequate volume Performed at [facility], [address] [place], [place] [address]    Culture  Setup Time   Final    GRAM NEGATIVE RODS IN BOTH AEROBIC AND ANAEROBIC BOTTLES CRITICAL RESULT CALLED TO, READ BACK BY AND VERIFIED WITH: DAVID BESANTI AT 2355 ON 03/06/19 RWW Performed at [facility], [address] [place], [place] [address]    Culture (A)  Final    ESCHERICHIA COLI SUSCEPTIBILITIES PERFORMED ON PREVIOUS CULTURE WITHIN THE LAST 5 DAYS. Performed at [facility], [place] [address] [place], [place] [address]    Report Status 03/10/2019 FINAL  Final   Blood Culture (routine x 2)     Status: Abnormal   Collection Time: 03/06/19 12:06 PM   Specimen: BLOOD  Result Value Ref Range Status   Specimen Description   Final    BLOOD BLOOD RIGHT HAND Performed at [facility], [address] [place], [place] [address]    Special Requests   Final    BOTTLES DRAWN AEROBIC AND ANAEROBIC Blood Culture adequate volume Performed at [facility], [address], [place], [place] [address]    Culture  Setup Time   Final    GRAM NEGATIVE RODS IN BOTH AEROBIC AND ANAEROBIC BOTTLES CRITICAL RESULT CALLED TO, READ BACK BY AND VERIFIED WITH: [place] AT 2355 ON 03/06/19 RWW Performed at [facility], [place] [address] [place],  [address]    Culture ESCHERICHIA COLI (A)  Final   Report Status  03/10/2019 FINAL  Final   Organism ID, Bacteria ESCHERICHIA COLI  Final      Susceptibility   Escherichia coli - MIC*    AMPICILLIN >=32 RESISTANT Resistant     CEFAZOLIN <=4 SENSITIVE Sensitive     CEFEPIME <=1 SENSITIVE Sensitive     CEFTAZIDIME <=1 SENSITIVE Sensitive     CEFTRIAXONE <=1 SENSITIVE Sensitive     CIPROFLOXACIN <=0.25 SENSITIVE Sensitive     GENTAMICIN <=1 SENSITIVE Sensitive     IMIPENEM <=0.25 SENSITIVE Sensitive     TRIMETH/SULFA <=20 SENSITIVE Sensitive     AMPICILLIN/SULBACTAM >=32 RESISTANT Resistant     PIP/TAZO <=4 SENSITIVE Sensitive     Extended ESBL NEGATIVE Sensitive     * ESCHERICHIA COLI  Blood Culture ID Panel (Reflexed)     Status: Abnormal   Collection Time: 03/06/19 12:06 PM  Result Value Ref Range Status   Enterococcus species NOT DETECTED NOT DETECTED Final   Listeria monocytogenes NOT DETECTED NOT DETECTED Final   Staphylococcus species NOT DETECTED NOT DETECTED Final   Staphylococcus aureus (BCID) NOT DETECTED NOT DETECTED Final   Streptococcus species NOT DETECTED NOT DETECTED Final   Streptococcus agalactiae NOT DETECTED NOT DETECTED Final   Streptococcus pneumoniae NOT  DETECTED NOT DETECTED Final   Streptococcus pyogenes NOT DETECTED NOT DETECTED Final   Acinetobacter baumannii NOT DETECTED NOT DETECTED Final   Enterobacteriaceae species DETECTED (A) NOT DETECTED Final    Comment: Enterobacteriaceae represent a large family of gram-negative bacteria, not a single organism. CRITICAL RESULT CALLED TO, READ BACK BY AND VERIFIED WITH: DAVID BESANTI AT 2355 ON 03/06/19 RWW    Enterobacter cloacae complex NOT DETECTED NOT DETECTED Final   Escherichia coli DETECTED (A) NOT DETECTED Final    Comment: CRITICAL RESULT CALLED TO, READ BACK BY AND VERIFIED WITH: DAVID BESANTI AT 2355 ON 03/06/19 RWW    Klebsiella oxytoca NOT DETECTED NOT DETECTED Final   Klebsiella pneumoniae NOT DETECTED NOT DETECTED Final   Proteus species NOT DETECTED NOT DETECTED Final   Serratia marcescens NOT DETECTED NOT DETECTED Final   Carbapenem resistance NOT DETECTED NOT DETECTED Final   Haemophilus influenzae NOT DETECTED NOT DETECTED Final   Neisseria meningitidis NOT DETECTED NOT DETECTED Final   Pseudomonas aeruginosa NOT DETECTED NOT DETECTED Final   Candida albicans NOT DETECTED NOT DETECTED Final   Candida glabrata NOT DETECTED NOT DETECTED Final   Candida krusei NOT DETECTED NOT DETECTED Final   Candida parapsilosis NOT DETECTED NOT DETECTED Final   Candida tropicalis NOT DETECTED NOT DETECTED Final    Comment: Performed at [facility], [address] [place], [place] [address]  MRSA PCR Screening     Status: None   Collection Time: 03/07/19  2:05 AM   Specimen: Nasal Mucosa; Nasopharyngeal  Result Value Ref Range Status   MRSA by PCR NEGATIVE NEGATIVE Final    Comment:        The GeneXpert MRSA Assay (FDA approved for NASAL specimens only), is one component of a comprehensive MRSA colonization surveillance program. It is not intended to diagnose MRSA infection nor to guide or monitor treatment for MRSA infections. Performed at [facility], [address] [place], [place] [address]     Coagulation Studies: No results for input(s): LABPROT, INR in the last 72 hours.  Urinalysis: No results for input(s): COLORURINE, LABSPEC, PHURINE, GLUCOSEU, HGBUR, BILIRUBINUR, KETONESUR, PROTEINUR, UROBILINOGEN, NITRITE, LEUKOCYTESUR in the last 72 hours.  Invalid input(s): APPERANCEUR    Imaging: Ct Renal  Stone Study  Result Date: 03/09/2019 CLINICAL DATA:  Hydronephrosis. EXAM: CT ABDOMEN AND PELVIS WITHOUT CONTRAST TECHNIQUE: Multidetector CT imaging of the abdomen and pelvis was performed following the standard protocol without IV contrast. COMPARISON:  01/08/2017 FINDINGS: Lower chest: Basilar atelectasis bilaterally. Hepatobiliary: The liver shows diffusely decreased attenuation suggesting fat deposition. Tiny hypodensity anterior liver on 21/2 is similar to prior. Gallbladder not visualized and may be surgically absent. No intrahepatic or extrahepatic biliary dilation. Pancreas: No focal mass lesion. No dilatation of the main duct. No intraparenchymal cyst. No peripancreatic edema. Spleen: No splenomegaly. No focal mass lesion. Adrenals/Urinary Tract: No adrenal nodule or mass. Absent right kidney. Mild to moderate left hydroureteronephrosis. Left kidney demonstrates duplicated collecting system with atrophy of the upper pole moiety. High attenuation material in the upper pole collecting system may represent stones/stone debris. This is not well evaluated on noncontrast imaging. There is perinephric and proximal periureteric edema/stranding. Posterior bladder wall irregularity evident. Stomach/Bowel: Stomach is decompressed. Duodenum is normally positioned as is the ligament of Treitz. No small bowel wall thickening. No small bowel dilatation. Colon is diffusely dilated with air and stool, similar to prior. Vascular/Lymphatic: No abdominal aortic aneurysm. No abdominal lymphadenopathy No pelvic sidewall lymphadenopathy. Reproductive: The prostate  gland and seminal vesicles are unremarkable. Other: No intraperitoneal free fluid. Musculoskeletal: No worrisome lytic or sclerotic osseous abnormality. IMPRESSION: 1. Mild to moderate left hydroureteronephrosis with perinephric and periureteric edema/stranding. 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 High attenuation material in the left upper pole collecting system may represent stones/stone debris and is stable in the interval since prior study. 2. Absent right kidney. 3. Diffuse colonic distention with air and stool, similar to prior. Colonic dysmotility would be a consideration. 4. Hepatic steatosis. Electronically Signed   By: Misty Stanley M.D.   On: 03/09/2019 12:12     Medications:   . sodium chloride 100 mL/hr at 03/10/19 0017  . cefTRIAXone (ROCEPHIN)  IV Stopped (03/10/19 0737)   . ARIPiprazole  15 mg Oral Daily  . Chlorhexidine Gluconate Cloth  6 each Topical Daily  . clonazePAM  1 mg Oral QHS  . enoxaparin (LOVENOX) injection  40 mg Subcutaneous Q24H  . insulin aspart  0-5 Units Subcutaneous QHS  . insulin aspart  0-9 Units Subcutaneous TID WC  . insulin detemir  20 Units Subcutaneous QHS  . metoprolol tartrate  12.5 mg Oral Daily  . nystatin  5 mL Oral QID  . phenytoin  100 mg Oral Daily  . phenytoin  300 mg Oral QHS  . QUEtiapine  800 mg Oral QHS   acetaminophen **OR** acetaminophen, ondansetron **OR** ondansetron (ZOFRAN) IV, polyethylene glycol, traMADol  Assessment/ Plan:  58 y.o. male with a PMHx of diabetes mellitus type 2, hypertension, hyperlipidemia, seizure disorder, chronic kidney disease stage II, who was admitted to [facility] on 03/06/2019 for evaluation of confusion.  1.  Acute kidney injury likely secondary to sepsis from UTI, volume depletion, and use of HCTZ/mild to moderate left-sided hydronephrosis.  CT scan abdomen and pelvis completed.  There is left hydroureteronephrosis with debris in the left upper pole.  We will consult with  urology regarding these findings.  Maintain the patient on ceftriaxone for now.  Follow-up renal parameters today.  2.  Chronic kidney disease stage II.  Patient with history of right nephrectomy.  Awaiting urology input given findings on the left.  3.  Hypertension.  Maintain the patient on metoprolol.   LOS: 4 Kia Varnadore 11/6/20209:26 AM

## 2019-03-10 NOTE — Progress Notes (Signed)
Physical Therapy Treatment Patient Details Name: Jason Reed MRN: [id number] DOB: Sep 06, 1960 Today's Date: 03/10/2019    History of Present Illness pt is a 58 yo male who presented to ED from group home for confusion, difficulty walking and fever, found to have mild UTI, PMH includes DM, seizure disorder, psychosis    PT Comments    Pt generally lethargic and intermittently responsive today, following directions inconsistently.  He nodded when asked if he wanted to transfer to chair but stopped midway through moving LE's over EOB and required total A for all movement.  PT guided pt through supine there ex and stopped mid range and halfway through each set, requiring PT to assist in completion.  Pt intermittently verbal throughout treatment and responded that he felt "better" today and that he was not feeling "pain".  He nodded when PT relayed to pt that he will need to be more mobile to discharge back to his group home.  Pt will continue to benefit from skilled PT with focus on strength, tolerance to activity and safe functional mobility.  Follow Up Recommendations  SNF     Equipment Recommendations  Other (comment)(TBD)    Recommendations for Other Services       Precautions / Restrictions Precautions Precautions: Fall Restrictions Weight Bearing Restrictions: No    Mobility  Bed Mobility Overal bed mobility: Needs Assistance             General bed mobility comments: Initiated attempt to move LE's over EOB but stopped and required manual assist for all movement.  Transfers                    Ambulation/Gait                 Stairs             Wheelchair Mobility    Modified Rankin (Stroke Patients Only)       Balance                                            Cognition Arousal/Alertness: Lethargic Behavior During Therapy: WFL for tasks assessed/performed Overall Cognitive Status: History of cognitive impairments  - at baseline                                 General Comments: pt follows one step commands inconsistently with increased time.      Exercises Other Exercises Other Exercises: supine ther. ex.: hip abduction, SLR, heel slides, ankle pumps: x10 BLE, Vc's to continue movement as pt would stop mid range and mid set.  x13 min    General Comments        Pertinent Vitals/Pain Pain Assessment: No/denies pain    Home Living                      Prior Function            PT Goals (current goals can now be found in the care plan section) Acute Rehab PT Goals PT Goal Formulation: Patient unable to participate in goal setting    Frequency    Min 2X/week      PT Plan Current plan remains appropriate    Co-evaluation  AM-PAC PT "6 Clicks" Mobility   Outcome Measure  Help needed turning from your back to your side while in a flat bed without using bedrails?: Total Help needed moving from lying on your back to sitting on the side of a flat bed without using bedrails?: Total Help needed moving to and from a bed to a chair (including a wheelchair)?: Total Help needed standing up from a chair using your arms (e.g., wheelchair or bedside chair)?: Total Help needed to walk in hospital room?: Total Help needed climbing 3-5 steps with a railing? : Total 6 Click Score: 6    End of Session Equipment Utilized During Treatment: Gait belt Activity Tolerance: Patient limited by fatigue Patient left: in bed;with call bell/phone within reach;with nursing/sitter in room Nurse Communication: Mobility status PT Visit Diagnosis: Muscle weakness (generalized) (M62.81);Difficulty in walking, not elsewhere classified (R26.2);Other abnormalities of gait and mobility (R26.89)     Time: [id number] PT Time Calculation (min) (ACUTE ONLY): 19 min  Charges:  $Therapeutic Exercise: 8-22 mins                     Roxanne Gates, PT, DPT    Roxanne Gates 03/10/2019, 3:47 PM

## 2019-03-10 NOTE — Progress Notes (Signed)
Jason Reed at [place] NAME: Jason Reed    MR#:  [id number]  DATE OF BIRTH:  [birth year]  SUBJECTIVE:   Patient feels a whole lot better. He is eating better. Drinking fluids. Feels more alert.  At baseline he walks. REVIEW OF SYSTEMS:   Review of Systems  Constitutional: Negative for chills, fever and weight loss.  HENT: Negative for ear discharge, ear pain and nosebleeds.   Eyes: Negative for blurred vision, pain and discharge.  Respiratory: Negative for sputum production, shortness of breath, wheezing and stridor.   Cardiovascular: Negative for chest pain, palpitations, orthopnea and PND.  Gastrointestinal: Negative for abdominal pain, diarrhea, nausea and vomiting.  Genitourinary: Negative for frequency and urgency.  Musculoskeletal: Negative for back pain and joint pain.  Neurological: Positive for weakness. Negative for sensory change, speech change and focal weakness.  Psychiatric/Behavioral: Negative for depression and hallucinations. The patient is not nervous/anxious.    Tolerating Diet: yes Tolerating PT: recommends rehab  DRUG ALLERGIES:   Allergies  Allergen Reactions  . Lisinopril     Unknown    VITALS:  Blood pressure 129/74, pulse 100, temperature 98 F (36.7 C), resp. rate 17, height 6\' 1"  (1.854 m), weight 115.4 kg, SpO2 99 %.  PHYSICAL EXAMINATION:   Physical Exam  GENERAL:  58 y.o.-year-old patient lying in the bed with no acute distress.  EYES: Pupils equal, round, reactive to light and accommodation. No scleral icterus. Extraocular muscles intact.  HEENT: Head atraumatic, normocephalic. Oropharynx and nasopharynx clear.  NECK:  Supple, no jugular venous distention. No thyroid enlargement, no tenderness.  LUNGS: Normal breath sounds bilaterally, no wheezing, rales, rhonchi. No use of accessory muscles of respiration.  CARDIOVASCULAR: S1, S2 normal. No murmurs, rubs, or gallops.  ABDOMEN: Soft,  nontender, nondistended. Bowel sounds present. No organomegaly or mass.  EXTREMITIES: No cyanosis, clubbing or edema b/l.    NEUROLOGIC: Cranial nerves II through XII are intact. No focal Motor or sensory deficits b/l. Overall weak PSYCHIATRIC:  patient is alert and awake.   LABORATORY PANEL:  CBC Recent Labs  Lab 03/07/19 0625  WBC 13.6*  HGB 9.1*  HCT 29.1*  PLT 108*    Chemistries  Recent Labs  Lab 03/06/19 1043  03/10/19 0904  NA 136   < > 145  K 4.0   < > 4.2  CL 96*   < > 115*  CO2 22   < > 20*  GLUCOSE 483*   < > 116*  BUN 57*   < > 60*  CREATININE 3.12*   < > 1.93*  CALCIUM 8.5*   < > 7.9*  AST 73*  --   --   ALT 50*  --   --   ALKPHOS 79  --   --   BILITOT 1.6*  --   --    < > = values in this interval not displayed.   Cardiac Enzymes No results for input(s): TROPONINI in the last 168 hours. RADIOLOGY:  Ct Renal Stone Study  Result Date: 03/09/2019 CLINICAL DATA:  Hydronephrosis. EXAM: CT ABDOMEN AND PELVIS WITHOUT CONTRAST TECHNIQUE: Multidetector CT imaging of the abdomen and pelvis was performed following the standard protocol without IV contrast. COMPARISON:  01/08/2017 FINDINGS: Lower chest: Basilar atelectasis bilaterally. Hepatobiliary: The liver shows diffusely decreased attenuation suggesting fat deposition. Tiny hypodensity anterior liver on 21/2 is similar to prior. Gallbladder not visualized and may be surgically absent. No intrahepatic or extrahepatic biliary dilation. Pancreas: No  focal mass lesion. No dilatation of the main duct. No intraparenchymal cyst. No peripancreatic edema. Spleen: No splenomegaly. No focal mass lesion. Adrenals/Urinary Tract: No adrenal nodule or mass. Absent right kidney. Mild to moderate left hydroureteronephrosis. Left kidney demonstrates duplicated collecting system with atrophy of the upper pole moiety. High attenuation material in the upper pole collecting system may represent stones/stone debris. This is not well  evaluated on noncontrast imaging. There is perinephric and proximal periureteric edema/stranding. Posterior bladder wall irregularity evident. Stomach/Bowel: Stomach is decompressed. Duodenum is normally positioned as is the ligament of Treitz. No small bowel wall thickening. No small bowel dilatation. Colon is diffusely dilated with air and stool, similar to prior. Vascular/Lymphatic: No abdominal aortic aneurysm. No abdominal lymphadenopathy No pelvic sidewall lymphadenopathy. Reproductive: The prostate gland and seminal vesicles are unremarkable. Other: No intraperitoneal free fluid. Musculoskeletal: No worrisome lytic or sclerotic osseous abnormality. IMPRESSION: 1. Mild to moderate left hydroureteronephrosis with perinephric and periureteric edema/stranding. 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 High attenuation material in the left upper pole collecting system may represent stones/stone debris and is stable in the interval since prior study. 2. Absent right kidney. 3. Diffuse colonic distention with air and stool, similar to prior. Colonic dysmotility would be a consideration. 4. Hepatic steatosis. Electronically Signed   By: Misty Stanley M.D.   On: 03/09/2019 12:12   ASSESSMENT AND PLAN:  Kendyll Andreski is a 58 y.o. male with medical history significant for diabetes mellitus, seizure disorder and psychosis who lives in a group home who was sent over today, 11/2, with reports that he had not been acting well, with difficulty walking and reported fever.  Reportedly confused.    * E coli Sepsis ([place]) secondary to acute lower UTI with AKI due to ATN with sepsis -Patient meets criteria for sepsis on admission given lactic acidosis, acute kidney injury, tachycardia and leukocytosis.   -Blood cultures + Ecoli - cont aggressive IV fluid resuscitation.  - Reviewed renal ultrasound, no evidence of the right obstruction. -CT renal study shows Mild to moderate left  hydroureteronephrosis with perinephric and periureteric edema/stranding. 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 He thinks this is chronic -IV rocephin according to UC C/s--change to oral from tomorrow  *AKI (acute kidney injury) ([place]) in the setting of stage II chronic kidney disease: Secondary to sepsis.  Aggressive fluid resuscitation, follow labs. -Baseline creatinine around 1.5 -came in with creatinine of 3.49--- 3.62--1.93 (baseline 1.5-1.77) -recieved IV fluids--tolerating po intake -nephrology input apprecaited --dr Zollie Scale -pt has  good UOP  -avoid nephrotoxic agents  *Diabetes mellitus due to underlying condition with stage 2 chronic kidney disease, with long-term current use of insulin ([place]): - Sliding scale and have ordered subcu Lantus at a decreased dose.  -A1c 5.7%   *Obesity (BMI 30-39.9): Patient meets criteria with BMI greater than 30.   * chronic Psychosis [facility]): Continuing home medications.   *chronic  Seizure disorder [facility]): Continue Dilantin, check Dilantin level--11.6  *discharge disposition -- physical therapy recommends rehab. Social worker consulted  I have asked PT to work with him and see if he can return back to his group home  Case discussed with Care Management/Social Worker.  CODE STATUS: Full  DVT Prophylaxis: lovenox  TOTAL TIME TAKING CARE OF THIS PATIENT: *30* minutes.  >50% time spent on counselling and coordination of care  POSSIBLE D/C IN few DAYS, DEPENDING ON CLINICAL CONDITION.  Note: This dictation was prepared with Dragon dictation along with smaller  Company secretary. Any transcriptional errors that result from this process are unintentional.  Fritzi Mandes M.D on 03/10/2019 at 12:21 PM  Between 7am to 6pm - Pager - [phone]  After 6pm go to [email or web address] - password [email or web address]   CC: Primary care physician; Marguerita Merles, MDPatient ID: Jason Reed, male   DOB: 31-Mar-1961, 58 y.o.   MRN: [id number]

## 2019-03-11 DIAGNOSIS — A4151 Sepsis due to Escherichia coli [E. coli]: Principal | ICD-10-CM

## 2019-03-11 DIAGNOSIS — F29 Unspecified psychosis not due to a substance or known physiological condition: Secondary | ICD-10-CM

## 2019-03-11 DIAGNOSIS — N170 Acute kidney failure with tubular necrosis: Secondary | ICD-10-CM

## 2019-03-11 LAB — GLUCOSE, CAPILLARY
Glucose-Capillary: 102 mg/dL — ABNORMAL HIGH (ref 70–99)
Glucose-Capillary: 111 mg/dL — ABNORMAL HIGH (ref 70–99)
Glucose-Capillary: 116 mg/dL — ABNORMAL HIGH (ref 70–99)
Glucose-Capillary: 129 mg/dL — ABNORMAL HIGH (ref 70–99)

## 2019-03-11 MED ORDER — CEPHALEXIN 500 MG PO CAPS
500.0000 mg | ORAL_CAPSULE | Freq: Four times a day (QID) | ORAL | Status: DC
  Administered 2019-03-11 (×2): 500 mg via ORAL
  Filled 2019-03-11 (×2): qty 1

## 2019-03-11 NOTE — Plan of Care (Signed)

## 2019-03-11 NOTE — Progress Notes (Signed)
[facility]  ROUNDING NOTE   Subjective:  Urine output 1.8 L over the preceding 24 hours. Awaiting new renal function testing today. Creatinine yesterday was 1.9.  Objective:  Vital signs in last 24 hours:  Temp:  [97.5 F (36.4 C)-98.6 F (37 C)] 98.5 F (36.9 C) (11/07 0452) Pulse Rate:  [94-99] 97 (11/07 0452) Resp:  [16-18] 16 (11/07 0452) BP: (130-143)/(72-88) 134/77 (11/07 0452) SpO2:  [98 %-100 %] 98 % (11/07 0452)  Weight change:  Filed Weights   03/06/19 1000 03/07/19 0200 03/07/19 1709  Weight: 108.9 kg 114.6 kg 115.4 kg    Intake/Output: I/O last 3 completed shifts: In: 2915.1 [P.O.:240; I.V.:2675.1] Out: 3875 [Urine:3875]   Intake/Output this shift:  No intake/output data recorded.  Physical Exam: General: No acute distress  Head: Normocephalic, atraumatic. Moist oral mucosal membranes  Eyes: Anicteric  Neck: Supple, trachea midline  Lungs:  Clear to auscultation, normal effort  Heart: S1S2 no rubs  Abdomen:  Soft, nontender, bowel sounds present  Extremities: No peripheral edema.  Neurologic: Awake, alert, following commands  Skin: No lesions       Basic Metabolic Panel: Recent Labs  Lab 03/06/19 1043 03/06/19 2339 03/07/19 0625 03/10/19 0904  NA 136  --  143 145  K 4.0  --  3.7 4.2  CL 96*  --  108 115*  CO2 22  --  23 20*  GLUCOSE 483*  --  303* 116*  BUN 57*  --  60* 60*  CREATININE 3.12* 3.27* 3.49* 1.93*  CALCIUM 8.5*  --  7.7* 7.9*  PHOS  --   --   --  2.8    Liver Function Tests: Recent Labs  Lab 03/06/19 1043 03/10/19 0904  AST 73*  --   ALT 50*  --   ALKPHOS 79  --   BILITOT 1.6*  --   PROT 7.7  --   ALBUMIN 3.5 2.2*   No results for input(s): LIPASE, AMYLASE in the last 168 hours. No results for input(s): AMMONIA in the last 168 hours.  CBC: Recent Labs  Lab 03/06/19 1043 03/06/19 2339 03/07/19 0625  WBC 17.3* 16.3* 13.6*  NEUTROABS 13.7*  --   --   HGB 10.9* 9.3* 9.1*  HCT 34.4* 29.7* 29.1*   MCV 91.7 90.8 91.5  PLT 136* 106* 108*    Cardiac Enzymes: No results for input(s): CKTOTAL, CKMB, CKMBINDEX, TROPONINI in the last 168 hours.  BNP: Invalid input(s): POCBNP  CBG: Recent Labs  Lab 03/09/19 2134 03/10/19 0814 03/10/19 1209 03/10/19 1729 03/10/19 2038  GLUCAP 160* 109* 118* 122* 131*    Microbiology: Results for orders placed or performed during the hospital encounter of 03/06/19  SARS CORONAVIRUS 2 (TAT 6-24 HRS) Nasopharyngeal Nasopharyngeal Swab     Status: None   Collection Time: 03/06/19 11:11 AM   Specimen: Nasopharyngeal Swab  Result Value Ref Range Status   SARS Coronavirus 2 NEGATIVE NEGATIVE Final    Comment: (NOTE) SARS-CoV-2 target nucleic acids are NOT DETECTED. The SARS-CoV-2 RNA is generally detectable in upper and lower respiratory specimens during the acute phase of infection. Negative results do not preclude SARS-CoV-2 infection, do not rule out co-infections with other pathogens, and should not be used as the sole basis for treatment or other patient management decisions. Negative results must be combined with clinical observations, patient history, and epidemiological information. The expected result is Negative. Fact Sheet for Patients: SugarRoll.be Fact Sheet for Healthcare Providers: https://www.woods-mathews.com/ This test is not yet  approved or cleared by the [place] and  has been authorized for detection and/or diagnosis of SARS-CoV-2 by FDA under an Emergency Use Authorization (EUA). This EUA will remain  in effect (meaning this test can be used) for the duration of the COVID-19 declaration under Section 56 4(b)(1) of the Act, 21 U.S.C. section 360bbb-3(b)(1), unless the authorization is terminated or revoked sooner. Performed at [facility], [place] [address] [place], [place] [address]   Urine Culture     Status: Abnormal   Collection Time: 03/06/19 11:11 AM    Specimen: Urine, Random  Result Value Ref Range Status   Specimen Description   Final    URINE, RANDOM Performed at [facility], [address], [place], [place] [address]    Special Requests   Final    NONE Performed at [facility], [address] [place], [place] [address]    Culture >=100,000 COLONIES/mL ESCHERICHIA COLI (A)  Final   Report Status 03/08/2019 FINAL  Final   Organism ID, Bacteria ESCHERICHIA COLI (A)  Final      Susceptibility   Escherichia coli - MIC*    AMPICILLIN >=32 RESISTANT Resistant     CEFAZOLIN <=4 SENSITIVE Sensitive     CEFTRIAXONE <=1 SENSITIVE Sensitive     CIPROFLOXACIN <=0.25 SENSITIVE Sensitive     GENTAMICIN <=1 SENSITIVE Sensitive     IMIPENEM <=0.25 SENSITIVE Sensitive     NITROFURANTOIN <=16 SENSITIVE Sensitive     TRIMETH/SULFA <=20 SENSITIVE Sensitive     AMPICILLIN/SULBACTAM >=32 RESISTANT Resistant     PIP/TAZO <=4 SENSITIVE Sensitive     Extended ESBL NEGATIVE Sensitive     * >=100,000 COLONIES/mL ESCHERICHIA COLI  Blood Culture (routine x 2)     Status: Abnormal   Collection Time: 03/06/19 12:06 PM   Specimen: BLOOD  Result Value Ref Range Status   Specimen Description   Final    BLOOD BLOOD LEFT HAND Performed at [facility], [address], [place], [place] [address]    Special Requests   Final    BOTTLES DRAWN AEROBIC AND ANAEROBIC Blood Culture adequate volume Performed at [facility], [address] [place], [place] [address]    Culture  Setup Time   Final    GRAM NEGATIVE RODS IN BOTH AEROBIC AND ANAEROBIC BOTTLES CRITICAL RESULT CALLED TO, READ BACK BY AND VERIFIED WITH: DAVID BESANTI AT 2355 ON 03/06/19 RWW Performed at [facility], [address] [place], [place] [address]    Culture (A)  Final    ESCHERICHIA COLI SUSCEPTIBILITIES PERFORMED ON PREVIOUS CULTURE WITHIN THE LAST 5 DAYS. Performed at [facility], [place] [address], [place], [place] [address]     Report Status 03/10/2019 FINAL  Final  Blood Culture (routine x 2)     Status: Abnormal   Collection Time: 03/06/19 12:06 PM   Specimen: BLOOD  Result Value Ref Range Status   Specimen Description   Final    BLOOD BLOOD RIGHT HAND Performed at [facility], [address], [place], [place] [address]    Special Requests   Final    BOTTLES DRAWN AEROBIC AND ANAEROBIC Blood Culture adequate volume Performed at [facility], [address], [place], [place] [address]    Culture  Setup Time   Final    GRAM NEGATIVE RODS IN BOTH AEROBIC AND ANAEROBIC BOTTLES CRITICAL RESULT CALLED TO, READ BACK BY AND VERIFIED WITH: DAVID BESANTI AT 2355 ON  03/06/19 RWW Performed at [facility] [address], [place], [place] [address]    Culture ESCHERICHIA COLI (A)  Final   Report Status 03/10/2019 FINAL  Final   Organism ID, Bacteria ESCHERICHIA COLI  Final      Susceptibility   Escherichia coli - MIC*    AMPICILLIN >=32 RESISTANT Resistant     CEFAZOLIN <=4 SENSITIVE Sensitive     CEFEPIME <=1 SENSITIVE Sensitive     CEFTAZIDIME <=1 SENSITIVE Sensitive     CEFTRIAXONE <=1 SENSITIVE Sensitive     CIPROFLOXACIN <=0.25 SENSITIVE Sensitive     GENTAMICIN <=1 SENSITIVE Sensitive     IMIPENEM <=0.25 SENSITIVE Sensitive     TRIMETH/SULFA <=20 SENSITIVE Sensitive     AMPICILLIN/SULBACTAM >=32 RESISTANT Resistant     PIP/TAZO <=4 SENSITIVE Sensitive     Extended ESBL NEGATIVE Sensitive     * ESCHERICHIA COLI  Blood Culture ID Panel (Reflexed)     Status: Abnormal   Collection Time: 03/06/19 12:06 PM  Result Value Ref Range Status   Enterococcus species NOT DETECTED NOT DETECTED Final   Listeria monocytogenes NOT DETECTED NOT DETECTED Final   Staphylococcus species NOT DETECTED NOT DETECTED Final   Staphylococcus aureus (BCID) NOT DETECTED NOT DETECTED Final   Streptococcus species NOT DETECTED NOT DETECTED Final   Streptococcus agalactiae NOT DETECTED NOT DETECTED  Final   Streptococcus pneumoniae NOT DETECTED NOT DETECTED Final   Streptococcus pyogenes NOT DETECTED NOT DETECTED Final   Acinetobacter baumannii NOT DETECTED NOT DETECTED Final   Enterobacteriaceae species DETECTED (A) NOT DETECTED Final    Comment: Enterobacteriaceae represent a large family of gram-negative bacteria, not a single organism. CRITICAL RESULT CALLED TO, READ BACK BY AND VERIFIED WITH: DAVID BESANTI AT 2355 ON 03/06/19 RWW    Enterobacter cloacae complex NOT DETECTED NOT DETECTED Final   Escherichia coli DETECTED (A) NOT DETECTED Final    Comment: CRITICAL RESULT CALLED TO, READ BACK BY AND VERIFIED WITH: DAVID BESANTI AT 2355 ON 03/06/19 RWW    Klebsiella oxytoca NOT DETECTED NOT DETECTED Final   Klebsiella pneumoniae NOT DETECTED NOT DETECTED Final   Proteus species NOT DETECTED NOT DETECTED Final   Serratia marcescens NOT DETECTED NOT DETECTED Final   Carbapenem resistance NOT DETECTED NOT DETECTED Final   Haemophilus influenzae NOT DETECTED NOT DETECTED Final   Neisseria meningitidis NOT DETECTED NOT DETECTED Final   Pseudomonas aeruginosa NOT DETECTED NOT DETECTED Final   Candida albicans NOT DETECTED NOT DETECTED Final   Candida glabrata NOT DETECTED NOT DETECTED Final   Candida krusei NOT DETECTED NOT DETECTED Final   Candida parapsilosis NOT DETECTED NOT DETECTED Final   Candida tropicalis NOT DETECTED NOT DETECTED Final    Comment: Performed at [facility], [address] [place], [place] [address]  MRSA PCR Screening     Status: None   Collection Time: 03/07/19  2:05 AM   Specimen: Nasal Mucosa; Nasopharyngeal  Result Value Ref Range Status   MRSA by PCR NEGATIVE NEGATIVE Final    Comment:        The GeneXpert MRSA Assay (FDA approved for NASAL specimens only), is one component of a comprehensive MRSA colonization surveillance program. It is not intended to diagnose MRSA infection nor to guide or monitor treatment for MRSA  infections. Performed at [facility], [address] [place], [place] [address]     Coagulation Studies: No results for input(s): LABPROT, INR in the last 72 hours.  Urinalysis: No results  for input(s): COLORURINE, LABSPEC, [place], GLUCOSEU, HGBUR, BILIRUBINUR, KETONESUR, PROTEINUR, UROBILINOGEN, NITRITE, LEUKOCYTESUR in the last 72 hours.  Invalid input(s): APPERANCEUR    Imaging: Ct Renal Stone Study  Result Date: 03/09/2019 CLINICAL DATA:  Hydronephrosis. EXAM: CT ABDOMEN AND PELVIS WITHOUT CONTRAST TECHNIQUE: Multidetector CT imaging of the abdomen and pelvis was performed following the standard protocol without IV contrast. COMPARISON:  01/08/2017 FINDINGS: Lower chest: Basilar atelectasis bilaterally. Hepatobiliary: The liver shows diffusely decreased attenuation suggesting fat deposition. Tiny hypodensity anterior liver on 21/2 is similar to prior. Gallbladder not visualized and may be surgically absent. No intrahepatic or extrahepatic biliary dilation. Pancreas: No focal mass lesion. No dilatation of the main duct. No intraparenchymal cyst. No peripancreatic edema. Spleen: No splenomegaly. No focal mass lesion. Adrenals/Urinary Tract: No adrenal nodule or mass. Absent right kidney. Mild to moderate left hydroureteronephrosis. Left kidney demonstrates duplicated collecting system with atrophy of the upper pole moiety. High attenuation material in the upper pole collecting system may represent stones/stone debris. This is not well evaluated on noncontrast imaging. There is perinephric and proximal periureteric edema/stranding. Posterior bladder wall irregularity evident. Stomach/Bowel: Stomach is decompressed. Duodenum is normally positioned as is the ligament of Treitz. No small bowel wall thickening. No small bowel dilatation. Colon is diffusely dilated with air and stool, similar to prior. Vascular/Lymphatic: No abdominal aortic aneurysm. No abdominal lymphadenopathy No pelvic  sidewall lymphadenopathy. Reproductive: The prostate gland and seminal vesicles are unremarkable. Other: No intraperitoneal free fluid. Musculoskeletal: No worrisome lytic or sclerotic osseous abnormality. IMPRESSION: 1. Mild to moderate left hydroureteronephrosis with perinephric and periureteric edema/stranding. 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 High attenuation material in the left upper pole collecting system may represent stones/stone debris and is stable in the interval since prior study. 2. Absent right kidney. 3. Diffuse colonic distention with air and stool, similar to prior. Colonic dysmotility would be a consideration. 4. Hepatic steatosis. Electronically Signed   By: Misty Stanley M.D.   On: 03/09/2019 12:12     Medications:    . ARIPiprazole  15 mg Oral Daily  . cephALEXin  500 mg Oral Q12H  . Chlorhexidine Gluconate Cloth  6 each Topical Daily  . clonazePAM  1 mg Oral QHS  . enoxaparin (LOVENOX) injection  40 mg Subcutaneous Q24H  . insulin aspart  0-5 Units Subcutaneous QHS  . insulin aspart  0-9 Units Subcutaneous TID WC  . insulin detemir  20 Units Subcutaneous QHS  . metoprolol tartrate  12.5 mg Oral Daily  . nystatin  5 mL Oral QID  . phenytoin  100 mg Oral Daily  . phenytoin  300 mg Oral QHS  . QUEtiapine  800 mg Oral QHS   acetaminophen **OR** acetaminophen, ondansetron **OR** ondansetron (ZOFRAN) IV, polyethylene glycol, traMADol  Assessment/ Plan:  58 y.o. male with a PMHx of diabetes mellitus type 2, hypertension, hyperlipidemia, seizure disorder, chronic kidney disease stage II, who was admitted to [facility] on 03/06/2019 for evaluation of confusion.  1.  Acute kidney injury likely secondary to sepsis from UTI, volume depletion, and use of HCTZ/mild to moderate left-sided hydronephrosis.  Appreciate input from urology.  It appears that his hydronephrosis has some element of chronicity.  Therefore no urgent intervention planned at this  time.  Repeat renal parameters today.  Overall renal function does appear to be improving.  Antibiotic management as per hospitalist.  Currently on cephalexin.  2.  Chronic kidney disease stage II.  Patient with history of right nephrectomy.    3.  Hypertension.  Blood pressure well controlled at 134/77.  Maintain the patient on current dosage of metoprolol.   LOS: 5 Maelle Sheaffer 11/7/20207:36 AM

## 2019-03-11 NOTE — Progress Notes (Signed)
[place] at [place] NAME: Jason Reed    MR#:  [id number]  DATE OF BIRTH:  1960-12-25  SUBJECTIVE:   Patient feels a whole lot better. He is eating better. Drinking fluids. Feels more alert. UOP 1875 cc/24 hr At baseline he walks. REVIEW OF SYSTEMS:   Review of Systems  Constitutional: Negative for chills, fever and weight loss.  HENT: Negative for ear discharge, ear pain and nosebleeds.   Eyes: Negative for blurred vision, pain and discharge.  Respiratory: Negative for sputum production, shortness of breath, wheezing and stridor.   Cardiovascular: Negative for chest pain, palpitations, orthopnea and PND.  Gastrointestinal: Negative for abdominal pain, diarrhea, nausea and vomiting.  Genitourinary: Negative for frequency and urgency.  Musculoskeletal: Negative for back pain and joint pain.  Neurological: Positive for weakness. Negative for sensory change, speech change and focal weakness.  Psychiatric/Behavioral: Negative for depression and hallucinations. The patient is not nervous/anxious.    Tolerating Diet: yes Tolerating PT: recommends rehab  DRUG ALLERGIES:   Allergies  Allergen Reactions  . Lisinopril     Unknown    VITALS:  Blood pressure 134/77, pulse 97, temperature 98.5 F (36.9 C), temperature source Oral, resp. rate 16, height 6\' 1"  (1.854 m), weight 115.4 kg, SpO2 98 %.  PHYSICAL EXAMINATION:   Physical Exam  GENERAL:  58 y.o.-year-old patient lying in the bed with no acute distress.  EYES: Pupils equal, round, reactive to light and accommodation. No scleral icterus. Extraocular muscles intact.  HEENT: Head atraumatic, normocephalic. Oropharynx and nasopharynx clear.  NECK:  Supple, no jugular venous distention. No thyroid enlargement, no tenderness.  LUNGS: Normal breath sounds bilaterally, no wheezing, rales, rhonchi. No use of accessory muscles of respiration.  CARDIOVASCULAR: S1, S2 normal. No murmurs,  rubs, or gallops.  ABDOMEN: Soft, nontender, nondistended. Bowel sounds present. No organomegaly or mass.  EXTREMITIES: No cyanosis, clubbing or edema b/l.    NEUROLOGIC: Cranial nerves II through XII are intact. No focal Motor or sensory deficits b/l. Overall weak PSYCHIATRIC:  patient is alert and awake.   LABORATORY PANEL:  CBC Recent Labs  Lab 03/07/19 0625  WBC 13.6*  HGB 9.1*  HCT 29.1*  PLT 108*    Chemistries  Recent Labs  Lab 03/06/19 1043  03/10/19 0904  NA 136   < > 145  K 4.0   < > 4.2  CL 96*   < > 115*  CO2 22   < > 20*  GLUCOSE 483*   < > 116*  BUN 57*   < > 60*  CREATININE 3.12*   < > 1.93*  CALCIUM 8.5*   < > 7.9*  AST 73*  --   --   ALT 50*  --   --   ALKPHOS 79  --   --   BILITOT 1.6*  --   --    < > = values in this interval not displayed.   Cardiac Enzymes No results for input(s): TROPONINI in the last 168 hours. RADIOLOGY:  Ct Renal Stone Study  Result Date: 03/09/2019 CLINICAL DATA:  Hydronephrosis. EXAM: CT ABDOMEN AND PELVIS WITHOUT CONTRAST TECHNIQUE: Multidetector CT imaging of the abdomen and pelvis was performed following the standard protocol without IV contrast. COMPARISON:  01/08/2017 FINDINGS: Lower chest: Basilar atelectasis bilaterally. Hepatobiliary: The liver shows diffusely decreased attenuation suggesting fat deposition. Tiny hypodensity anterior liver on 21/2 is similar to prior. Gallbladder not visualized and may be surgically absent. No intrahepatic  or extrahepatic biliary dilation. Pancreas: No focal mass lesion. No dilatation of the main duct. No intraparenchymal cyst. No peripancreatic edema. Spleen: No splenomegaly. No focal mass lesion. Adrenals/Urinary Tract: No adrenal nodule or mass. Absent right kidney. Mild to moderate left hydroureteronephrosis. Left kidney demonstrates duplicated collecting system with atrophy of the upper pole moiety. High attenuation material in the upper pole collecting system may represent  stones/stone debris. This is not well evaluated on noncontrast imaging. There is perinephric and proximal periureteric edema/stranding. Posterior bladder wall irregularity evident. Stomach/Bowel: Stomach is decompressed. Duodenum is normally positioned as is the ligament of Treitz. No small bowel wall thickening. No small bowel dilatation. Colon is diffusely dilated with air and stool, similar to prior. Vascular/Lymphatic: No abdominal aortic aneurysm. No abdominal lymphadenopathy No pelvic sidewall lymphadenopathy. Reproductive: The prostate gland and seminal vesicles are unremarkable. Other: No intraperitoneal free fluid. Musculoskeletal: No worrisome lytic or sclerotic osseous abnormality. IMPRESSION: 1. Mild to moderate left hydroureteronephrosis with perinephric and periureteric edema/stranding. 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 High attenuation material in the left upper pole collecting system may represent stones/stone debris and is stable in the interval since prior study. 2. Absent right kidney. 3. Diffuse colonic distention with air and stool, similar to prior. Colonic dysmotility would be a consideration. 4. Hepatic steatosis. Electronically Signed   By: Misty Stanley M.D.   On: 03/09/2019 12:12   ASSESSMENT AND PLAN:  Jason Reed is a 58 y.o. male with medical history significant for diabetes mellitus, seizure disorder and psychosis who lives in a group home who was sent over today, 11/2, with reports that he had not been acting well, with difficulty walking and reported fever.  Reportedly confused.    * E coli Sepsis ([place]) secondary to acute lower UTI with AKI due to ATN with sepsis -Patient meets criteria for sepsis on admission given lactic acidosis, acute kidney injury, tachycardia and leukocytosis.   -Blood cultures + Ecoli - cont aggressive IV fluid resuscitation.  - Reviewed renal ultrasound, no evidence of the right obstruction. -CT renal study shows  Mild to moderate left hydroureteronephrosis with perinephric and periureteric edema/stranding. 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 He thinks this is chronic -IV rocephin according to UC C/s--change to oral kelfex (total 10 days)  *AKI (acute kidney injury) ([place]) in the setting of stage II chronic kidney disease: Secondary to sepsis.  -recieved aggressive fluid resuscitation. -Baseline creatinine around 1.5 -came in with creatinine of 3.49--- 3.62--1.93 (baseline 1.5-1.77) -recieved IV fluids--tolerating po intake -nephrology input apprecaited --dr Zollie Scale -pt has  good UOP  -avoid nephrotoxic agents  *Diabetes mellitus due to underlying condition with stage 2 chronic kidney disease, with long-term current use of insulin ([place]): - Sliding scale and have ordered subcut Lantus at a decreased dose.  -A1c 5.7%   * chronic Psychosis ([place]): Continuing home medications.   *chronic  Seizure disorder [facility]): Continue Dilantin, check Dilantin level--11.6  *discharge disposition -- physical therapy recommends rehab. Social worker consulted   Case discussed with Care Management/Social Worker.  CODE STATUS: Full  DVT Prophylaxis: lovenox  TOTAL TIME TAKING CARE OF THIS PATIENT: *30* minutes.  >50% time spent on counselling and coordination of care  POSSIBLE D/C IN few DAYS, DEPENDING ON CLINICAL CONDITION.  Note: This dictation was prepared with Dragon dictation along with smaller phrase technology. Any transcriptional errors that result from this process are unintentional.  Fritzi Mandes M.D on 03/11/2019 at 8:42 AM  Between 7am to  6pm - Pager - [phone]  After 6pm go to [email or web address] - password [email or web address]   CC: Primary care physician; Marguerita Merles, MDPatient ID: Jason Reed, male   DOB: 1961-02-27, 58 y.o.   MRN: [id number]

## 2019-03-11 NOTE — Progress Notes (Signed)
PHARMACY NOTE:  ANTIMICROBIAL RENAL DOSAGE ADJUSTMENT  Current antimicrobial regimen includes a mismatch between antimicrobial dosage and estimated renal function.  As per policy approved by the Pharmacy & Therapeutics and Medical Executive Committees, the antimicrobial dosage will be adjusted accordingly.  Current antimicrobial dosage:  Cephalexin 500mg  BID  Indication: E. Coli Bacteremia and UTI  Renal Function:  Estimated Creatinine Clearance: 54.2 mL/min (A) (by C-G formula based on SCr of 1.93 mg/dL (H)).    Antimicrobial dosage has been changed to:  Cephalexin 500mg  every 6 hours   Additional comments: Thank you for allowing pharmacy to be a part of this patient's care.  Pernell Dupre, PharmD, BCPS Clinical Pharmacist 03/11/2019 2:24 PM

## 2019-03-12 DIAGNOSIS — E669 Obesity, unspecified: Secondary | ICD-10-CM

## 2019-03-12 LAB — GLUCOSE, CAPILLARY
Glucose-Capillary: 101 mg/dL — ABNORMAL HIGH (ref 70–99)
Glucose-Capillary: 135 mg/dL — ABNORMAL HIGH (ref 70–99)
Glucose-Capillary: 110 mg/dL — ABNORMAL HIGH (ref 70–99)
Glucose-Capillary: 153 mg/dL — ABNORMAL HIGH (ref 70–99)

## 2019-03-12 MED ORDER — CEPHALEXIN 500 MG PO CAPS
500.0000 mg | ORAL_CAPSULE | Freq: Four times a day (QID) | ORAL | Status: DC
  Administered 2019-03-12 – 2019-03-15 (×12): 500 mg via ORAL
  Filled 2019-03-12 (×12): qty 1

## 2019-03-12 MED ORDER — CEPHALEXIN 500 MG PO CAPS: 500.0000 mg | ORAL_CAPSULE | Freq: Four times a day (QID) | ORAL | Status: DC

## 2019-03-12 NOTE — Plan of Care (Signed)

## 2019-03-12 NOTE — Progress Notes (Signed)
[place] at [place] NAME: Jason Reed    MR#:  [id number]  DATE OF BIRTH:  11/20/1960  SUBJECTIVE:   Patient feels a whole lot better. He is eating better. Drinking fluids. Feels more alert. UOP 2100 cc/24 hr At baseline he walks. REVIEW OF SYSTEMS:   Review of Systems  Constitutional: Negative for chills, fever and weight loss.  HENT: Negative for ear discharge, ear pain and nosebleeds.   Eyes: Negative for blurred vision, pain and discharge.  Respiratory: Negative for sputum production, shortness of breath, wheezing and stridor.   Cardiovascular: Negative for chest pain, palpitations, orthopnea and PND.  Gastrointestinal: Negative for abdominal pain, diarrhea, nausea and vomiting.  Genitourinary: Negative for frequency and urgency.  Musculoskeletal: Negative for back pain and joint pain.  Neurological: Positive for weakness. Negative for sensory change, speech change and focal weakness.  Psychiatric/Behavioral: Negative for depression and hallucinations. The patient is not nervous/anxious.    Tolerating Diet: yes Tolerating PT: recommends rehab  DRUG ALLERGIES:   Allergies  Allergen Reactions  . Lisinopril     Unknown    VITALS:  Blood pressure 120/61, pulse 100, temperature 98.2 F (36.8 C), temperature source Oral, resp. rate 16, height 6\' 1"  (1.854 m), weight 115.4 kg, SpO2 97 %.  PHYSICAL EXAMINATION:   Physical Exam  GENERAL:  58 y.o.-year-old patient lying in the bed with no acute distress.  EYES: Pupils equal, round, reactive to light and accommodation. No scleral icterus. Extraocular muscles intact.  HEENT: Head atraumatic, normocephalic. Oropharynx and nasopharynx clear.  NECK:  Supple, no jugular venous distention. No thyroid enlargement, no tenderness.  LUNGS: Normal breath sounds bilaterally, no wheezing, rales, rhonchi. No use of accessory muscles of respiration.  CARDIOVASCULAR: S1, S2 normal. No murmurs,  rubs, or gallops.  ABDOMEN: Soft, nontender, nondistended. Bowel sounds present. No organomegaly or mass. Condom Catheter EXTREMITIES: No cyanosis, clubbing or edema b/l.    NEUROLOGIC: Cranial nerves II through XII are intact. No focal Motor or sensory deficits b/l. Overall weak PSYCHIATRIC:  patient is alert and awake.   LABORATORY PANEL:  CBC Recent Labs  Lab 03/07/19 0625  WBC 13.6*  HGB 9.1*  HCT 29.1*  PLT 108*    Chemistries  Recent Labs  Lab 03/06/19 1043  03/10/19 0904  NA 136   < > 145  K 4.0   < > 4.2  CL 96*   < > 115*  CO2 22   < > 20*  GLUCOSE 483*   < > 116*  BUN 57*   < > 60*  CREATININE 3.12*   < > 1.93*  CALCIUM 8.5*   < > 7.9*  AST 73*  --   --   ALT 50*  --   --   ALKPHOS 79  --   --   BILITOT 1.6*  --   --    < > = values in this interval not displayed.   Cardiac Enzymes No results for input(s): TROPONINI in the last 168 hours. RADIOLOGY:  No results found. ASSESSMENT AND PLAN:  Jason Reed is a 58 y.o. male with medical history significant for diabetes mellitus, seizure disorder and psychosis who lives in a group home who was sent over today, 11/2, with reports that he had not been acting well, with difficulty walking and reported fever.  Reportedly confused.    * E coli Sepsis ([place]) secondary to acute lower UTI with AKI due to ATN with sepsis -  Patient meets criteria for sepsis on admission given lactic acidosis, acute kidney injury, tachycardia and leukocytosis.   -Blood cultures + Ecoli - Reviewed renal ultrasound, no evidence of the right obstruction. -CT renal study shows Mild to moderate left hydroureteronephrosis with perinephric and periureteric edema/stranding. 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 He thinks this is chronic -IV rocephin according to UC C/s--change to oral kelfex (total 10 days)  *AKI (acute kidney injury) ([place]) in the setting of stage II chronic kidney  disease: Secondary to sepsis.  -recieved aggressive fluid resuscitation. -Baseline creatinine around 1.5 -came in with creatinine of 3.49--- 3.62--1.93 (baseline 1.5-1.77) -recieved IV fluids--tolerating po intake -nephrology input apprecaited --dr Zollie Scale -pt has  good UOP  -avoid nephrotoxic agents  *Diabetes mellitus due to underlying condition with stage 2 chronic kidney disease, with long-term current use of insulin ([place]): - Sliding scale and have ordered subcut Lantus at a decreased dose.  -A1c 5.7%   * chronic Psychosis ([place]): Continuing home medications.   *chronic  Seizure disorder [facility]): Continue Dilantin, check Dilantin level--11.6  *discharge disposition -- physical therapy recommends rehab.  -Social worker consulted   Case discussed with Care Management/Social Worker.  CODE STATUS: Full  DVT Prophylaxis: lovenox  TOTAL TIME TAKING CARE OF THIS PATIENT: *25* minutes.  >50% time spent on counselling and coordination of care  POSSIBLE D/C IN 1-2 DAYS, DEPENDING ON CLINICAL CONDITION.  Note: This dictation was prepared with Dragon dictation along with smaller phrase technology. Any transcriptional errors that result from this process are unintentional.  Fritzi Mandes M.D on 03/12/2019 at 8:59 AM  Between 7am to 6pm - Pager - [phone]  After 6pm go to [email or web address] - password [email or web address]   CC: Primary care physician; Marguerita Merles, MDPatient ID: Jason Reed, male   DOB: 05/17/1960, 58 y.o.   MRN: [id number]

## 2019-03-12 NOTE — Progress Notes (Signed)
[facility]  ROUNDING NOTE   Subjective:  Patient continues to improve. Urine output was 2.1 L over the preceding 24 hours. Creatinine currently 1.9.  Objective:  Vital signs in last 24 hours:  Temp:  [97.4 F (36.3 C)-98.5 F (36.9 C)] 98.4 F (36.9 C) (11/08 0916) Pulse Rate:  [95-101] 99 (11/08 0922) Resp:  [16-18] 18 (11/08 0916) BP: (120-147)/(61-83) 139/71 (11/08 0916) SpO2:  [96 %-98 %] 96 % (11/08 0922)  Weight change:  Filed Weights   03/06/19 1000 03/07/19 0200 03/07/19 1709  Weight: 108.9 kg 114.6 kg 115.4 kg    Intake/Output: I/O last 3 completed shifts: In: 600 [P.O.:600] Out: 3225 [Urine:3225]   Intake/Output this shift:  Total I/O In: 240 [P.O.:240] Out: 500 [Urine:500]  Physical Exam: General: No acute distress  Head: Normocephalic, atraumatic. Moist oral mucosal membranes  Eyes: Anicteric  Neck: Supple, trachea midline  Lungs:  Clear to auscultation, normal effort  Heart: S1S2 no rubs  Abdomen:  Soft, nontender, bowel sounds present  Extremities: No peripheral edema.  Neurologic: Awake, alert, following commands  Skin: No lesions       Basic Metabolic Panel: Recent Labs  Lab 03/06/19 1043 03/06/19 2339 03/07/19 0625 03/10/19 0904  NA 136  --  143 145  K 4.0  --  3.7 4.2  CL 96*  --  108 115*  CO2 22  --  23 20*  GLUCOSE 483*  --  303* 116*  BUN 57*  --  60* 60*  CREATININE 3.12* 3.27* 3.49* 1.93*  CALCIUM 8.5*  --  7.7* 7.9*  PHOS  --   --   --  2.8    Liver Function Tests: Recent Labs  Lab 03/06/19 1043 03/10/19 0904  AST 73*  --   ALT 50*  --   ALKPHOS 79  --   BILITOT 1.6*  --   PROT 7.7  --   ALBUMIN 3.5 2.2*   No results for input(s): LIPASE, AMYLASE in the last 168 hours. No results for input(s): AMMONIA in the last 168 hours.  CBC: Recent Labs  Lab 03/06/19 1043 03/06/19 2339 03/07/19 0625  WBC 17.3* 16.3* 13.6*  NEUTROABS 13.7*  --   --   HGB 10.9* 9.3* 9.1*  HCT 34.4* 29.7* 29.1*  MCV  91.7 90.8 91.5  PLT 136* 106* 108*    Cardiac Enzymes: No results for input(s): CKTOTAL, CKMB, CKMBINDEX, TROPONINI in the last 168 hours.  BNP: Invalid input(s): POCBNP  CBG: Recent Labs  Lab 03/11/19 1140 03/11/19 1704 03/11/19 2222 03/12/19 0800 03/12/19 1143  GLUCAP 111* 116* 129* 101* 135*    Microbiology: Results for orders placed or performed during the hospital encounter of 03/06/19  SARS CORONAVIRUS 2 (TAT 6-24 HRS) Nasopharyngeal Nasopharyngeal Swab     Status: None   Collection Time: 03/06/19 11:11 AM   Specimen: Nasopharyngeal Swab  Result Value Ref Range Status   SARS Coronavirus 2 NEGATIVE NEGATIVE Final    Comment: (NOTE) SARS-CoV-2 target nucleic acids are NOT DETECTED. The SARS-CoV-2 RNA is generally detectable in upper and lower respiratory specimens during the acute phase of infection. Negative results do not preclude SARS-CoV-2 infection, do not rule out co-infections with other pathogens, and should not be used as the sole basis for treatment or other patient management decisions. Negative results must be combined with clinical observations, patient history, and epidemiological information. The expected result is Negative. Fact Sheet for Patients: SugarRoll.be Fact Sheet for Healthcare Providers: https://www.woods-mathews.com/ This test is not  yet approved or cleared by the [place] and  has been authorized for detection and/or diagnosis of SARS-CoV-2 by FDA under an Emergency Use Authorization (EUA). This EUA will remain  in effect (meaning this test can be used) for the duration of the COVID-19 declaration under Section 56 4(b)(1) of the Act, 21 U.S.C. section 360bbb-3(b)(1), unless the authorization is terminated or revoked sooner. Performed at [facility], [place] [address], [place], [place] [address]   Urine Culture     Status: Abnormal   Collection Time: 03/06/19 11:11 AM   Specimen:  Urine, Random  Result Value Ref Range Status   Specimen Description   Final    URINE, RANDOM Performed at [facility], [address], [place], [place] [address]    Special Requests   Final    NONE Performed at [facility], [address] [place], [place] [address]    Culture >=100,000 COLONIES/mL ESCHERICHIA COLI (A)  Final   Report Status 03/08/2019 FINAL  Final   Organism ID, Bacteria ESCHERICHIA COLI (A)  Final      Susceptibility   Escherichia coli - MIC*    AMPICILLIN >=32 RESISTANT Resistant     CEFAZOLIN <=4 SENSITIVE Sensitive     CEFTRIAXONE <=1 SENSITIVE Sensitive     CIPROFLOXACIN <=0.25 SENSITIVE Sensitive     GENTAMICIN <=1 SENSITIVE Sensitive     IMIPENEM <=0.25 SENSITIVE Sensitive     NITROFURANTOIN <=16 SENSITIVE Sensitive     TRIMETH/SULFA <=20 SENSITIVE Sensitive     AMPICILLIN/SULBACTAM >=32 RESISTANT Resistant     PIP/TAZO <=4 SENSITIVE Sensitive     Extended ESBL NEGATIVE Sensitive     * >=100,000 COLONIES/mL ESCHERICHIA COLI  Blood Culture (routine x 2)     Status: Abnormal   Collection Time: 03/06/19 12:06 PM   Specimen: BLOOD  Result Value Ref Range Status   Specimen Description   Final    BLOOD BLOOD LEFT HAND Performed at [facility], [address], [place], [place] [address]    Special Requests   Final    BOTTLES DRAWN AEROBIC AND ANAEROBIC Blood Culture adequate volume Performed at [facility], [address] [place], [place] [address]    Culture  Setup Time   Final    GRAM NEGATIVE RODS IN BOTH AEROBIC AND ANAEROBIC BOTTLES CRITICAL RESULT CALLED TO, READ BACK BY AND VERIFIED WITH: DAVID BESANTI AT 2355 ON 03/06/19 RWW Performed at [facility], [address] [place], [place] [address]    Culture (A)  Final    ESCHERICHIA COLI SUSCEPTIBILITIES PERFORMED ON PREVIOUS CULTURE WITHIN THE LAST 5 DAYS. Performed at [facility], [place] [address] [place], [place] [address]    Report  Status 03/10/2019 FINAL  Final  Blood Culture (routine x 2)     Status: Abnormal   Collection Time: 03/06/19 12:06 PM   Specimen: BLOOD  Result Value Ref Range Status   Specimen Description   Final    BLOOD BLOOD RIGHT HAND Performed at [facility], [address], [place],  [address]    Special Requests   Final    BOTTLES DRAWN AEROBIC AND ANAEROBIC Blood Culture adequate volume Performed at [facility], [address], [place],  [address]    Culture  Setup Time   Final    GRAM NEGATIVE RODS IN BOTH AEROBIC AND ANAEROBIC BOTTLES CRITICAL RESULT CALLED TO, READ BACK BY AND VERIFIED WITH: [place] 2355  ON 03/06/19 RWW Performed at [facility], [place] [address], [place], [place] [address]    Culture ESCHERICHIA COLI (A)  Final   Report Status 03/10/2019 FINAL  Final   Organism ID, Bacteria ESCHERICHIA COLI  Final      Susceptibility   Escherichia coli - MIC*    AMPICILLIN >=32 RESISTANT Resistant     CEFAZOLIN <=4 SENSITIVE Sensitive     CEFEPIME <=1 SENSITIVE Sensitive     CEFTAZIDIME <=1 SENSITIVE Sensitive     CEFTRIAXONE <=1 SENSITIVE Sensitive     CIPROFLOXACIN <=0.25 SENSITIVE Sensitive     GENTAMICIN <=1 SENSITIVE Sensitive     IMIPENEM <=0.25 SENSITIVE Sensitive     TRIMETH/SULFA <=20 SENSITIVE Sensitive     AMPICILLIN/SULBACTAM >=32 RESISTANT Resistant     PIP/TAZO <=4 SENSITIVE Sensitive     Extended ESBL NEGATIVE Sensitive     * ESCHERICHIA COLI  Blood Culture ID Panel (Reflexed)     Status: Abnormal   Collection Time: 03/06/19 12:06 PM  Result Value Ref Range Status   Enterococcus species NOT DETECTED NOT DETECTED Final   Listeria monocytogenes NOT DETECTED NOT DETECTED Final   Staphylococcus species NOT DETECTED NOT DETECTED Final   Staphylococcus aureus (BCID) NOT DETECTED NOT DETECTED Final   Streptococcus species NOT DETECTED NOT DETECTED Final   Streptococcus agalactiae NOT DETECTED NOT DETECTED Final    Streptococcus pneumoniae NOT DETECTED NOT DETECTED Final   Streptococcus pyogenes NOT DETECTED NOT DETECTED Final   Acinetobacter baumannii NOT DETECTED NOT DETECTED Final   Enterobacteriaceae species DETECTED (A) NOT DETECTED Final    Comment: Enterobacteriaceae represent a large family of gram-negative bacteria, not a single organism. CRITICAL RESULT CALLED TO, READ BACK BY AND VERIFIED WITH: DAVID BESANTI AT 2355 ON 03/06/19 RWW    Enterobacter cloacae complex NOT DETECTED NOT DETECTED Final   Escherichia coli DETECTED (A) NOT DETECTED Final    Comment: CRITICAL RESULT CALLED TO, READ BACK BY AND VERIFIED WITH: DAVID BESANTI AT 2355 ON 03/06/19 RWW    Klebsiella oxytoca NOT DETECTED NOT DETECTED Final   Klebsiella pneumoniae NOT DETECTED NOT DETECTED Final   Proteus species NOT DETECTED NOT DETECTED Final   Serratia marcescens NOT DETECTED NOT DETECTED Final   Carbapenem resistance NOT DETECTED NOT DETECTED Final   Haemophilus influenzae NOT DETECTED NOT DETECTED Final   Neisseria meningitidis NOT DETECTED NOT DETECTED Final   Pseudomonas aeruginosa NOT DETECTED NOT DETECTED Final   Candida albicans NOT DETECTED NOT DETECTED Final   Candida glabrata NOT DETECTED NOT DETECTED Final   Candida krusei NOT DETECTED NOT DETECTED Final   Candida parapsilosis NOT DETECTED NOT DETECTED Final   Candida tropicalis NOT DETECTED NOT DETECTED Final    Comment: Performed at [facility], [address] [place], [place] [address]  MRSA PCR Screening     Status: None   Collection Time: 03/07/19  2:05 AM   Specimen: Nasal Mucosa; Nasopharyngeal  Result Value Ref Range Status   MRSA by PCR NEGATIVE NEGATIVE Final    Comment:        The GeneXpert MRSA Assay (FDA approved for NASAL specimens only), is one component of a comprehensive MRSA colonization surveillance program. It is not intended to diagnose MRSA infection nor to guide or monitor treatment for MRSA infections. Performed  at [facility], [address] [place], [place] [address]     Coagulation Studies: No results for input(s): LABPROT, INR in the last 72 hours.  Urinalysis: No  results for input(s): COLORURINE, LABSPEC, [place], GLUCOSEU, HGBUR, BILIRUBINUR, KETONESUR, PROTEINUR, UROBILINOGEN, NITRITE, LEUKOCYTESUR in the last 72 hours.  Invalid input(s): APPERANCEUR    Imaging: No results found.   Medications:    . ARIPiprazole  15 mg Oral Daily  . cephALEXin  500 mg Oral Q6H  . Chlorhexidine Gluconate Cloth  6 each Topical Daily  . clonazePAM  1 mg Oral QHS  . enoxaparin (LOVENOX) injection  40 mg Subcutaneous Q24H  . insulin aspart  0-5 Units Subcutaneous QHS  . insulin aspart  0-9 Units Subcutaneous TID WC  . insulin detemir  20 Units Subcutaneous QHS  . metoprolol tartrate  12.5 mg Oral Daily  . nystatin  5 mL Oral QID  . phenytoin  100 mg Oral Daily  . phenytoin  300 mg Oral QHS  . QUEtiapine  800 mg Oral QHS   acetaminophen **OR** acetaminophen, ondansetron **OR** ondansetron (ZOFRAN) IV, polyethylene glycol, traMADol  Assessment/ Plan:  58 y.o. male with a PMHx of diabetes mellitus type 2, hypertension, hyperlipidemia, seizure disorder, chronic kidney disease stage II, who was admitted to [facility] on 03/06/2019 for evaluation of confusion.  1.  Acute kidney injury likely secondary to sepsis from UTI, volume depletion, and use of HCTZ/mild to moderate left-sided hydronephrosis.  Appreciate input from urology.  It appears that his hydronephrosis has some element of chronicity.  Therefore no urgent intervention planned at this time.   -Creatinine currently 1.9 with urine output of 2.1 L over the preceding 24 hours.  Slowly improving.  Continue supportive care.  2.  Chronic kidney disease stage II.  Patient with history of right nephrectomy.    3.  Hypertension.  Maintain the patient on metoprolol for blood pressure control.   LOS: 6 Rashod Gougeon 11/8/202012:45 PM

## 2019-03-13 LAB — GLUCOSE, CAPILLARY
Glucose-Capillary: 111 mg/dL — ABNORMAL HIGH (ref 70–99)
Glucose-Capillary: 122 mg/dL — ABNORMAL HIGH (ref 70–99)
Glucose-Capillary: 142 mg/dL — ABNORMAL HIGH (ref 70–99)
Glucose-Capillary: 153 mg/dL — ABNORMAL HIGH (ref 70–99)
Glucose-Capillary: 158 mg/dL — ABNORMAL HIGH (ref 70–99)

## 2019-03-13 LAB — CREATININE, SERUM
Creatinine, Ser: 1.63 mg/dL — ABNORMAL HIGH (ref 0.61–1.24)
GFR calc Af Amer: 52 mL/min — ABNORMAL LOW (ref >60)
GFR calc non Af Amer: 45 mL/min — ABNORMAL LOW (ref >60)

## 2019-03-13 MED ORDER — INSULIN DETEMIR 100 UNIT/ML ~~LOC~~ SOLN
18.0000 [IU] | Freq: Every day | SUBCUTANEOUS | Status: DC
  Administered 2019-03-13: 18 [IU] via SUBCUTANEOUS
  Filled 2019-03-13 (×3): qty 0.18

## 2019-03-13 MED ORDER — CLONAZEPAM 1 MG PO TABS: 1.0000 mg | ORAL_TABLET | Freq: Every day | ORAL | Status: DC

## 2019-03-13 MED ORDER — PSYLLIUM 95 % PO PACK
1.0000 | PACK | Freq: Every day | ORAL | Status: DC
  Administered 2019-03-13 – 2019-03-15 (×3): 1 via ORAL
  Filled 2019-03-13 (×3): qty 1

## 2019-03-13 MED ORDER — GABAPENTIN 600 MG PO TABS
600.0000 mg | ORAL_TABLET | Freq: Three times a day (TID) | ORAL | Status: DC
  Administered 2019-03-13 – 2019-03-15 (×7): 600 mg via ORAL
  Filled 2019-03-13 (×10): qty 1

## 2019-03-13 MED ORDER — MIRTAZAPINE 15 MG PO TBDP
15.0000 mg | ORAL_TABLET | Freq: Every day | ORAL | Status: DC
  Administered 2019-03-13 – 2019-03-14 (×2): 15 mg via ORAL
  Filled 2019-03-13 (×3): qty 1

## 2019-03-13 MED ORDER — SIMVASTATIN 20 MG PO TABS
20.0000 mg | ORAL_TABLET | Freq: Every day | ORAL | Status: DC
  Administered 2019-03-13 – 2019-03-14 (×2): 20 mg via ORAL
  Filled 2019-03-13 (×2): qty 1

## 2019-03-13 MED ORDER — GUAIFENESIN-DM 100-10 MG/5ML PO SYRP: 10.0000 mL | ORAL_SOLUTION | ORAL | Status: DC | PRN

## 2019-03-13 MED ORDER — ASPIRIN EC 81 MG PO TBEC
81.0000 mg | DELAYED_RELEASE_TABLET | Freq: Every day | ORAL | Status: DC
  Administered 2019-03-13 – 2019-03-15 (×3): 81 mg via ORAL
  Filled 2019-03-13 (×3): qty 1

## 2019-03-13 NOTE — Progress Notes (Signed)
[facility] NAME: Jason Reed    MR#:  [id number]  DATE OF BIRTH:  01-27-1961  SUBJECTIVE:   Patient feels a whole lot better. He is eating better. Drinking fluids.  UOP 1200 cc/24 hr At baseline he walks. Plans to work today with Pt after BF REVIEW OF SYSTEMS:   Review of Systems  Constitutional: Negative for chills, fever and weight loss.  HENT: Negative for ear discharge, ear pain and nosebleeds.   Eyes: Negative for blurred vision, pain and discharge.  Respiratory: Negative for sputum production, shortness of breath, wheezing and stridor.   Cardiovascular: Negative for chest pain, palpitations, orthopnea and PND.  Gastrointestinal: Negative for abdominal pain, diarrhea, nausea and vomiting.  Genitourinary: Negative for frequency and urgency.  Musculoskeletal: Negative for back pain and joint pain.  Neurological: Positive for weakness. Negative for sensory change, speech change and focal weakness.  Psychiatric/Behavioral: Negative for depression and hallucinations. The patient is not nervous/anxious.    Tolerating Diet: yes Tolerating PT: recommends rehab  DRUG ALLERGIES:   Allergies  Allergen Reactions  . Lisinopril     Unknown    VITALS:  Blood pressure 128/84, pulse 97, temperature 98.3 F (36.8 C), temperature source Oral, resp. rate 18, height 6\' 1"  (1.854 m), weight 115.4 kg, SpO2 100 %.  PHYSICAL EXAMINATION:   Physical Exam  GENERAL:  58 y.o.-year-old patient lying in the bed with no acute distress.  EYES: Pupils equal, round, reactive to light and accommodation. No scleral icterus.  HEENT: Head atraumatic, normocephalic. Oropharynx and nasopharynx clear.  NECK:  Supple, no jugular venous distention. No thyroid enlargement, no tenderness.  LUNGS: Normal breath sounds bilaterally, no wheezing, rales, rhonchi. No use of accessory muscles of respiration.  CARDIOVASCULAR: S1, S2 normal. No murmurs, rubs,  or gallops.  ABDOMEN: Soft, nontender, nondistended. Bowel sounds present. No organomegaly or mass. Condom Catheter+ EXTREMITIES: No cyanosis, clubbing or edema b/l.    NEUROLOGIC: Cranial nerves II through XII are intact. No focal Motor or sensory deficits b/l. Overall weak PSYCHIATRIC:  patient is alert and awake.  LABORATORY PANEL:  CBC Recent Labs  Lab 03/07/19 0625  WBC 13.6*  HGB 9.1*  HCT 29.1*  PLT 108*    Chemistries  Recent Labs  Lab 03/06/19 1043  03/10/19 0904 03/13/19 0455  NA 136   < > 145  --   K 4.0   < > 4.2  --   CL 96*   < > 115*  --   CO2 22   < > 20*  --   GLUCOSE 483*   < > 116*  --   BUN 57*   < > 60*  --   CREATININE 3.12*   < > 1.93* 1.63*  CALCIUM 8.5*   < > 7.9*  --   AST 73*  --   --   --   ALT 50*  --   --   --   ALKPHOS 79  --   --   --   BILITOT 1.6*  --   --   --    < > = values in this interval not displayed.   Cardiac Enzymes No results for input(s): TROPONINI in the last 168 hours. RADIOLOGY:  No results found. ASSESSMENT AND PLAN:  Jason Reed is a 58 y.o. male with medical history significant for diabetes mellitus, seizure disorder and psychosis who lives in a group home who was sent over today, 11/2,  with reports that he had not been acting well, with difficulty walking and reported fever.  Reportedly confused.    * E coli Sepsis ([place]) secondary to acute lower UTI with AKI due to ATN with sepsis -Patient meets criteria for sepsis on admission given lactic acidosis, acute kidney injury, tachycardia and leukocytosis.   -Blood cultures + Ecoli - Reviewed renal ultrasound, no evidence of the right obstruction. -CT renal study shows Mild to moderate left hydroureteronephrosis with perinephric and periureteric edema/stranding. 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  -recieved aggressive fluid resuscitation. -Baseline creatinine around 1.5 -came in with creatinine of 3.49--- 3.62--1.93 (baseline 1.5-1.77)--1.63 -recieved IV fluids--tolerating po intake -nephrology input apprecaited --dr Holley Raring -pt has  good UOP  -avoid nephrotoxic agents  *Diabetes mellitus due to underlying condition with stage 2 chronic kidney disease,now with long-term current use of insulin ([place]): - Sliding scale and have ordered subcut Lantus at a decreased dose.  -A1c 5.7% -holding metformin due to CKD-II  * HTN -pt is on small dose of metoprolol -his HCTZ, losartan is held since bp is managed with small dose of metorpolol.   * chronic Psychosis ([place]): Continuing home meds abilify, seroquel, remeron and klonopin   *chronic Seizure disorder [facility]): Continue Dilantin -Dilantin level--11.6  *discharge disposition -- physical therapy recommends rehab.  -Social worker consulted --she will d/w group home manager to see if pt can return back to familiar environment.  Pt is medically stable for discharge  Case discussed with Care Management/Social Worker.  CODE STATUS: Full  DVT Prophylaxis: lovenox  TOTAL TIME TAKING CARE OF THIS PATIENT: *25* minutes.  >50% time spent on counselling and coordination of care  Note: This dictation was prepared with Dragon dictation along with smaller phrase technology. Any transcriptional errors that result from this process are unintentional.  Fritzi Mandes M.D on 03/13/2019 at 8:52 AM  Between 7am to 6pm - Pager - [phone]  After 6pm go to [email or web address]  [facility]   CC: Primary care physician; Marguerita Merles, MDPatient ID: Jason Reed, male   DOB: 09/20/1960, 58 y.o.   MRN: [id number]

## 2019-03-13 NOTE — Care Management Important Message (Signed)
Important Message  Patient Details  Name: Jason Reed MRN: [id number] Date of Birth: 24-Apr-1961   Medicare Important Message Given:  Yes     Juliann Pulse A Mikyla Schachter 03/13/2019, 12:31 PM

## 2019-03-13 NOTE — TOC Progression Note (Signed)
Transition of Care [facility]) - Progression Note    Patient Details  Name: Kemari Balbin MRN: [id number] Date of Birth: 05/01/1961  Transition of Care [facility]) CM/SW Contact  Shelbie Hutching, RN Phone Number: 03/13/2019, 8:58 AM  Clinical Narrative:    Patient is doing much better, medically cleared for discharge.  RNCM spoke with group home owner Clara about patient returning to group home today.  Clara will come up to see patient to make sure he can get up and walk and is back to baseline.  Clara reports that she will be here sometime this morning.    Expected Discharge Plan: Group Home Barriers to Discharge: Continued Medical Work up  Expected Discharge Plan and Services Expected Discharge Plan: Group Home   Discharge Planning Services: CM Consult   Living arrangements for the past 2 months: Group Home                                       Social Determinants of Health (SDOH) Interventions    Readmission Risk Interventions No flowsheet data found.

## 2019-03-13 NOTE — Progress Notes (Signed)
[facility]  ROUNDING NOTE   Subjective:   Sitting up in bed.   UOP 1230mL  Objective:  Vital signs in last 24 hours:  Temp:  [98.3 F (36.8 C)-98.7 F (37.1 C)] 98.3 F (36.8 C) (11/09 0728) Pulse Rate:  [97-105] 97 (11/09 0728) Resp:  [18] 18 (11/09 0728) BP: (128-148)/(73-84) 128/84 (11/09 0728) SpO2:  [97 %-100 %] 100 % (11/09 0728)  Weight change:  Filed Weights   03/06/19 1000 03/07/19 0200 03/07/19 1709  Weight: 108.9 kg 114.6 kg 115.4 kg    Intake/Output: I/O last 3 completed shifts: In: 480 [P.O.:480] Out: 2075 [Urine:2075]   Intake/Output this shift:  Total I/O In: 240 [P.O.:240] Out: -   Physical Exam: General: No acute distress  Head: +frontal Bossing, atraumatic.   Eyes: Anicteric  Neck: Supple, trachea midline  Lungs:  Clear to auscultation, normal effort  Heart: regular  Abdomen:  Soft, nontender, bowel sounds present  Extremities: No peripheral edema.  Neurologic: Awake, alert, following commands  Skin: No lesions  GU Condom catheter    Basic Metabolic Panel: Recent Labs  Lab 03/06/19 2339 03/07/19 0625 03/10/19 0904 03/13/19 0455  NA  --  143 145  --   K  --  3.7 4.2  --   CL  --  108 115*  --   CO2  --  23 20*  --   GLUCOSE  --  303* 116*  --   BUN  --  60* 60*  --   CREATININE 3.27* 3.49* 1.93* 1.63*  CALCIUM  --  7.7* 7.9*  --   PHOS  --   --  2.8  --     Liver Function Tests: Recent Labs  Lab 03/10/19 0904  ALBUMIN 2.2*   No results for input(s): LIPASE, AMYLASE in the last 168 hours. No results for input(s): AMMONIA in the last 168 hours.  CBC: Recent Labs  Lab 03/06/19 2339 03/07/19 0625  WBC 16.3* 13.6*  HGB 9.3* 9.1*  HCT 29.7* 29.1*  MCV 90.8 91.5  PLT 106* 108*    Cardiac Enzymes: No results for input(s): CKTOTAL, CKMB, CKMBINDEX, TROPONINI in the last 168 hours.  BNP: Invalid input(s): POCBNP  CBG: Recent Labs  Lab 03/12/19 1143 03/12/19 1646 03/12/19 2059 03/13/19 0030  03/13/19 0727  GLUCAP 135* 110* 153* 142* 122*    Microbiology: Results for orders placed or performed during the hospital encounter of 03/06/19  SARS CORONAVIRUS 2 (TAT 6-24 HRS) Nasopharyngeal Nasopharyngeal Swab     Status: None   Collection Time: 03/06/19 11:11 AM   Specimen: Nasopharyngeal Swab  Result Value Ref Range Status   SARS Coronavirus 2 NEGATIVE NEGATIVE Final    Comment: (NOTE) SARS-CoV-2 target nucleic acids are NOT DETECTED. The SARS-CoV-2 RNA is generally detectable in upper and lower respiratory specimens during the acute phase of infection. Negative results do not preclude SARS-CoV-2 infection, do not rule out co-infections with other pathogens, and should not be used as the sole basis for treatment or other patient management decisions. Negative results must be combined with clinical observations, patient history, and epidemiological information. The expected result is Negative. Fact Sheet for Patients: SugarRoll.be Fact Sheet for Healthcare Providers: https://www.woods-mathews.com/ This test is not yet approved or cleared by the [place] FDA and  has been authorized for detection and/or diagnosis of SARS-CoV-2 by FDA under an Emergency Use Authorization (EUA). This EUA will remain  in effect (meaning this test can be used) for the duration of the  COVID-19 declaration under Section 56 4(b)(1) of the Act, 21 U.S.C. section 360bbb-3(b)(1), unless the authorization is terminated or revoked sooner. Performed at [facility], [place] [address], [place], [place] [address]   Urine Culture     Status: Abnormal   Collection Time: 03/06/19 11:11 AM   Specimen: Urine, Random  Result Value Ref Range Status   Specimen Description   Final    URINE, RANDOM Performed at [facility], [address], [place], [place] [address]    Special Requests   Final    NONE Performed at [facility], [address] [place], [place] [address]    Culture >=100,000 COLONIES/mL ESCHERICHIA COLI (A)  Final   Report Status 03/08/2019 FINAL  Final   Organism ID, Bacteria ESCHERICHIA COLI (A)  Final      Susceptibility   Escherichia coli - MIC*    AMPICILLIN >=32 RESISTANT Resistant     CEFAZOLIN <=4 SENSITIVE Sensitive     CEFTRIAXONE <=1 SENSITIVE Sensitive     CIPROFLOXACIN <=0.25 SENSITIVE Sensitive     GENTAMICIN <=1 SENSITIVE Sensitive     IMIPENEM <=0.25 SENSITIVE Sensitive     NITROFURANTOIN <=16 SENSITIVE Sensitive     TRIMETH/SULFA <=20 SENSITIVE Sensitive     AMPICILLIN/SULBACTAM >=32 RESISTANT Resistant     PIP/TAZO <=4 SENSITIVE Sensitive     Extended ESBL NEGATIVE Sensitive     * >=100,000 COLONIES/mL ESCHERICHIA COLI  Blood Culture (routine x 2)     Status: Abnormal   Collection Time: 03/06/19 12:06 PM   Specimen: BLOOD  Result Value Ref Range Status   Specimen Description   Final    BLOOD BLOOD LEFT HAND Performed at [facility], [address] [place], [place] [address]    Special Requests   Final    BOTTLES DRAWN AEROBIC AND ANAEROBIC Blood Culture adequate volume Performed at [facility], [address] [place], [place] [address]    Culture  Setup Time   Final    GRAM NEGATIVE RODS IN BOTH AEROBIC AND ANAEROBIC BOTTLES CRITICAL RESULT CALLED TO, READ BACK BY AND VERIFIED WITH: DAVID BESANTI AT 2355 ON 03/06/19 RWW Performed at [facility], [address] [place], [place] [address]    Culture (A)  Final    ESCHERICHIA COLI SUSCEPTIBILITIES PERFORMED ON PREVIOUS CULTURE WITHIN THE LAST 5 DAYS. Performed at [facility], [place] [address], [place], [place] [address]    Report Status 03/10/2019 FINAL  Final  Blood Culture (routine x 2)     Status: Abnormal   Collection Time: 03/06/19 12:06 PM   Specimen: BLOOD  Result Value Ref Range Status   Specimen Description   Final    BLOOD BLOOD RIGHT HAND Performed at [facility], [address] [place], [place] [address]    Special Requests   Final    BOTTLES DRAWN AEROBIC AND ANAEROBIC Blood Culture adequate volume Performed at [facility], [address] [place], [place] [address]    Culture  Setup Time   Final    GRAM NEGATIVE RODS IN BOTH AEROBIC AND ANAEROBIC BOTTLES CRITICAL RESULT CALLED TO, READ BACK BY AND VERIFIED WITH: Haigler AT 2355 ON 03/06/19 RWW Performed at [facility], [place] [address], [place],  [address]    Culture ESCHERICHIA COLI (A)  Final   Report Status 03/10/2019 FINAL  Final   Organism ID, Bacteria ESCHERICHIA COLI  Final  Susceptibility   Escherichia coli - MIC*    AMPICILLIN >=32 RESISTANT Resistant     CEFAZOLIN <=4 SENSITIVE Sensitive     CEFEPIME <=1 SENSITIVE Sensitive     CEFTAZIDIME <=1 SENSITIVE Sensitive     CEFTRIAXONE <=1 SENSITIVE Sensitive     CIPROFLOXACIN <=0.25 SENSITIVE Sensitive     GENTAMICIN <=1 SENSITIVE Sensitive     IMIPENEM <=0.25 SENSITIVE Sensitive     TRIMETH/SULFA <=20 SENSITIVE Sensitive     AMPICILLIN/SULBACTAM >=32 RESISTANT Resistant     PIP/TAZO <=4 SENSITIVE Sensitive     Extended ESBL NEGATIVE Sensitive     * ESCHERICHIA COLI  Blood Culture ID Panel (Reflexed)     Status: Abnormal   Collection Time: 03/06/19 12:06 PM  Result Value Ref Range Status   Enterococcus species NOT DETECTED NOT DETECTED Final   Listeria monocytogenes NOT DETECTED NOT DETECTED Final   Staphylococcus species NOT DETECTED NOT DETECTED Final   Staphylococcus aureus (BCID) NOT DETECTED NOT DETECTED Final   Streptococcus species NOT DETECTED NOT DETECTED Final   Streptococcus agalactiae NOT DETECTED NOT DETECTED Final   Streptococcus pneumoniae NOT DETECTED NOT DETECTED Final   Streptococcus pyogenes NOT DETECTED NOT DETECTED Final   Acinetobacter baumannii NOT DETECTED NOT DETECTED Final   Enterobacteriaceae species DETECTED (A) NOT DETECTED Final    Comment:  Enterobacteriaceae represent a large family of gram-negative bacteria, not a single organism. CRITICAL RESULT CALLED TO, READ BACK BY AND VERIFIED WITH: DAVID BESANTI AT 2355 ON 03/06/19 RWW    Enterobacter cloacae complex NOT DETECTED NOT DETECTED Final   Escherichia coli DETECTED (A) NOT DETECTED Final    Comment: CRITICAL RESULT CALLED TO, READ BACK BY AND VERIFIED WITH: DAVID BESANTI AT 2355 ON 03/06/19 RWW    Klebsiella oxytoca NOT DETECTED NOT DETECTED Final   Klebsiella pneumoniae NOT DETECTED NOT DETECTED Final   Proteus species NOT DETECTED NOT DETECTED Final   Serratia marcescens NOT DETECTED NOT DETECTED Final   Carbapenem resistance NOT DETECTED NOT DETECTED Final   Haemophilus influenzae NOT DETECTED NOT DETECTED Final   Neisseria meningitidis NOT DETECTED NOT DETECTED Final   Pseudomonas aeruginosa NOT DETECTED NOT DETECTED Final   Candida albicans NOT DETECTED NOT DETECTED Final   Candida glabrata NOT DETECTED NOT DETECTED Final   Candida krusei NOT DETECTED NOT DETECTED Final   Candida parapsilosis NOT DETECTED NOT DETECTED Final   Candida tropicalis NOT DETECTED NOT DETECTED Final    Comment: Performed at [facility], [address] [place], [place] [address]  MRSA PCR Screening     Status: None   Collection Time: 03/07/19  2:05 AM   Specimen: Nasal Mucosa; Nasopharyngeal  Result Value Ref Range Status   MRSA by PCR NEGATIVE NEGATIVE Final    Comment:        The GeneXpert MRSA Assay (FDA approved for NASAL specimens only), is one component of a comprehensive MRSA colonization surveillance program. It is not intended to diagnose MRSA infection nor to guide or monitor treatment for MRSA infections. Performed at [facility], [address] [place], [place] [address]     Coagulation Studies: No results for input(s): LABPROT, INR in the last 72 hours.  Urinalysis: No results for input(s): COLORURINE, LABSPEC, PHURINE, GLUCOSEU, HGBUR,  BILIRUBINUR, KETONESUR, PROTEINUR, UROBILINOGEN, NITRITE, LEUKOCYTESUR in the last 72 hours.  Invalid input(s): APPERANCEUR    Imaging: No results found.   Medications:    . ARIPiprazole  15 mg Oral Daily  . aspirin EC  81 mg Oral Daily  . cephALEXin  500 mg Oral Q6H  . Chlorhexidine Gluconate Cloth  6 each Topical Daily  . clonazePAM  1 mg Oral QHS  . enoxaparin (LOVENOX) injection  40 mg Subcutaneous Q24H  . gabapentin  600 mg Oral TID  . insulin aspart  0-5 Units Subcutaneous QHS  . insulin aspart  0-9 Units Subcutaneous TID WC  . insulin detemir  18 Units Subcutaneous QHS  . metoprolol tartrate  12.5 mg Oral Daily  . mirtazapine  15 mg Oral QHS  . nystatin  5 mL Oral QID  . phenytoin  100 mg Oral Daily  . phenytoin  300 mg Oral QHS  . psyllium  1 packet Oral Daily  . QUEtiapine  800 mg Oral QHS  . simvastatin  20 mg Oral QHS   acetaminophen **OR** acetaminophen, guaiFENesin-dextromethorphan, ondansetron **OR** ondansetron (ZOFRAN) IV, polyethylene glycol, traMADol  Assessment/ Plan:  58 y.o.black male with a PMHx of diabetes mellitus type 2, hypertension, hyperlipidemia, seizure disorder, chronic kidney disease stage II, who was admitted to [facility] on 03/06/2019 for evaluation of confusion.  1.  Acute kidney injury likely secondary to sepsis from UTI, volume depletion, and use of HCTZ and prerenal azotemia mild to moderate left-sided hydronephrosis.  Appreciate input from urology.  It appears that his hydronephrosis has some element of chronicity.  Therefore no urgent intervention planned at this time.   - Creatinine trended to 1.63. Baseline creatinine of 1.38, GFR > 60 on 01/2017. 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   2.  Hypertension.   - metoprolol   LOS: 7 Delvina Mizzell 11/9/202011:55 AM

## 2019-03-14 LAB — GLUCOSE, CAPILLARY
Glucose-Capillary: 109 mg/dL — ABNORMAL HIGH (ref 70–99)
Glucose-Capillary: 155 mg/dL — ABNORMAL HIGH (ref 70–99)
Glucose-Capillary: 129 mg/dL — ABNORMAL HIGH (ref 70–99)
Glucose-Capillary: 102 mg/dL — ABNORMAL HIGH (ref 70–99)

## 2019-03-14 MED ORDER — METOPROLOL TARTRATE 5 MG/5ML IV SOLN
2.5000 mg | Freq: Once | INTRAVENOUS | Status: AC
  Administered 2019-03-14: 2.5 mg via INTRAVENOUS
  Filled 2019-03-14: qty 5

## 2019-03-14 NOTE — Progress Notes (Signed)
Physical Therapy Treatment Patient Details Name: Jason Reed MRN: [id number] DOB: [birth year] Today's Date: 03/14/2019    History of Present Illness pt is a 58 yo male who presented to ED from group home for confusion, difficulty walking and fever, found to have mild UTI, PMH includes DM, seizure disorder, psychosis    PT Comments    Pt presented with deficits in strength, transfers, mobility, gait, balance, and activity tolerance.  Pt required mod to max A with bed mobility tasks with cues for sequencing.  Pt was able to stand with +2 mod A from an elevated EOB but stood with heavy lean on the RW with flexed trunk posture and was unable to come to full upright standing or advance either LE during any of the 4 standing sessions.  Max standing tolerance was 10-15 sec before pt would spontaneously return to sitting with little warning.  Clara from pt's group home confirmed that at baseline pt is very mobile including able to assist staff with taking out the garbage and ambulates without an AD.  Pt continues to present with a significant decline in functional mobility compared to his baseline and would not be safe to return to his prior living situation at this time.  Pt will benefit from PT services in a SNF setting upon discharge to safely address above deficits for decreased caregiver assistance and eventual return to PLOF.     Follow Up Recommendations  SNF     Equipment Recommendations  Other (comment)(TBD at next venue of care)    Recommendations for Other Services       Precautions / Restrictions Precautions Precautions: Fall Precaution Comments: Seizure disorder Restrictions Weight Bearing Restrictions: No    Mobility  Bed Mobility Overal bed mobility: Needs Assistance Bed Mobility: Supine to Sit;Sit to Supine     Supine to sit: Mod assist Sit to supine: Max assist   General bed mobility comments: Assist for BLE and trunk control  Transfers Overall transfer level:  Needs assistance Equipment used: Rolling walker (2 wheeled) Transfers: Sit to/from Stand Sit to Stand: +2 physical assistance;Mod assist         General transfer comment: Max verbal and tactile cues for sequencing with pt able to clear the surface of the bed but unable to come to full upright standing  Ambulation/Gait             General Gait Details: Unable/unsafe to attempt   Stairs             Wheelchair Mobility    Modified Rankin (Stroke Patients Only)       Balance Overall balance assessment: Needs assistance Sitting-balance support: Bilateral upper extremity supported;Feet supported Sitting balance-Leahy Scale: Fair     Standing balance support: Bilateral upper extremity supported Standing balance-Leahy Scale: Poor Standing balance comment: Heavy trunk flexion and lean on the RW in standing with posterior instability                            Cognition Arousal/Alertness: Lethargic Behavior During Therapy: WFL for tasks assessed/performed Overall Cognitive Status: History of cognitive impairments - at baseline                                 General Comments: pt follows one step commands with increased time and cues      Exercises Total Joint Exercises Ankle Circles/Pumps: AROM;Both;10 reps;5 reps [employer]  Sets: AAROM;AROM;Both;10 reps Short Arc Quad: AROM;Both;10 reps Heel Slides: AROM;Both;10 reps;AAROM Hip ABduction/ADduction: AROM;Both;10 reps;AAROM Straight Leg Raises: AROM;AAROM;Both;10 reps Long Arc Quad: AROM;Both;10 reps Other Exercises Other Exercises: Static and dynamic sitting balance at the EOB x 5 min including anterior weight shifting    General Comments        Pertinent Vitals/Pain Pain Assessment: No/denies pain    Home Living                      Prior Function            PT Goals (current goals can now be found in the care plan section) Progress towards PT goals: Progressing toward  goals    Frequency    Min 2X/week      PT Plan Current plan remains appropriate    Co-evaluation              AM-PAC PT "6 Clicks" Mobility   Outcome Measure  Help needed turning from your back to your side while in a flat bed without using bedrails?: A Lot Help needed moving from lying on your back to sitting on the side of a flat bed without using bedrails?: A Lot Help needed moving to and from a bed to a chair (including a wheelchair)?: Total Help needed standing up from a chair using your arms (e.g., wheelchair or bedside chair)?: A Lot Help needed to walk in hospital room?: Total Help needed climbing 3-5 steps with a railing? : Total 6 Click Score: 9    End of Session Equipment Utilized During Treatment: Gait belt Activity Tolerance: Patient tolerated treatment well Patient left: in bed;with call bell/phone within reach;with family/visitor present;with bed alarm set Nurse Communication: Mobility status PT Visit Diagnosis: Muscle weakness (generalized) (M62.81);Difficulty in walking, not elsewhere classified (R26.2);Other abnormalities of gait and mobility (R26.89)     Time: 1325(And 2:05-2:20 pm with break for hygiene)-1335 PT Time Calculation (min) (ACUTE ONLY): 10 min  Charges:  $Therapeutic Exercise: 8-22 mins $Therapeutic Activity: 8-22 mins                     D. Scott Kodiak Rollyson PT, DPT 03/14/19, 2:38 PM

## 2019-03-14 NOTE — TOC Progression Note (Signed)
Transition of Care [facility]) - Progression Note    Patient Details  Name: Jason Reed MRN: [id number] Date of Birth: [birth year]  Transition of Care [facility] CM/SW Contact  Shelbie Hutching, RN Phone Number: 03/14/2019, 2:53 PM  Clinical Narrative:    Clara from the group home was able to come and see patient while PT worked with him.  Patient participated well but is very weak.  Patient cannot return to the group home without first going to skilled.  Clara agrees for patient to go to [facility].  Patient needs new Covid.    Plan for discharge to [facility] tomorrow once COVID comes back.    Expected Discharge Plan: [place] Barriers to Discharge: Continued Medical Work up  Expected Discharge Plan and Services Expected Discharge Plan: [place]   Discharge Planning Services: CM Consult Post Acute Care Choice: [place] Living arrangements for the past 2 months: Group Home                                       Social Determinants of Health (SDOH) Interventions    Readmission Risk Interventions No flowsheet data found.

## 2019-03-14 NOTE — Progress Notes (Signed)
Pt HR sustaining at 115 NP [place] notified through page and secure chat. Order for 2.5mg  Metoprolol IVP. HR rechecked prior to giving. HR 113. Metoprolol given. Will continue to monitor.

## 2019-03-14 NOTE — Progress Notes (Signed)
[place] [facility] [place] NAME: Jason Reed    MR#:  [id number]  DATE OF BIRTH:  [birth year]  SUBJECTIVE:   Patient feels a whole lot better. He is eating better. Drinking fluids.  At baseline he walks. Plans to work today with PT after lunch. Caregiver from the group home [place] did not show up yesterday to late afternoon. PT could not work with patient. REVIEW OF SYSTEMS:   Review of Systems  Constitutional: Negative for chills, fever and weight loss.  HENT: Negative for ear discharge, ear pain and nosebleeds.   Eyes: Negative for blurred vision, pain and discharge.  Respiratory: Negative for sputum production, shortness of breath, wheezing and stridor.   Cardiovascular: Negative for chest pain, palpitations, orthopnea and PND.  Gastrointestinal: Negative for abdominal pain, diarrhea, nausea and vomiting.  Genitourinary: Negative for frequency and urgency.  Musculoskeletal: Negative for back pain and joint pain.  Neurological: Positive for weakness. Negative for sensory change, speech change and focal weakness.  Psychiatric/Behavioral: Negative for depression and hallucinations. The patient is not nervous/anxious.    Tolerating Diet: yes Tolerating PT: recommends rehab  DRUG ALLERGIES:   Allergies  Allergen Reactions  . Lisinopril     Unknown    VITALS:  Blood pressure 139/81, pulse (!) 101, temperature (!) 97.5 F (36.4 C), temperature source Oral, resp. rate 18, height 6\' 1"  (1.854 m), weight 115.4 kg, SpO2 96 %.  PHYSICAL EXAMINATION:   Physical Exam  GENERAL:  58 y.o.-year-old patient lying in the bed with no acute distress.  EYES: Pupils equal, round, reactive to light and accommodation. No scleral icterus.  HEENT: Head atraumatic, normocephalic. Oropharynx and nasopharynx clear.  NECK:  Supple, no jugular venous distention. No thyroid enlargement, no tenderness.  LUNGS: Normal breath sounds bilaterally, no wheezing,  rales, rhonchi. No use of accessory muscles of respiration.  CARDIOVASCULAR: S1, S2 normal. No murmurs, rubs, or gallops.  ABDOMEN: Soft, nontender, nondistended. Bowel sounds present. No organomegaly or mass. Condom Catheter+ EXTREMITIES: No cyanosis, clubbing or edema b/l.    NEUROLOGIC: Cranial nerves II through XII are intact. No focal Motor or sensory deficits b/l. Overall weak PSYCHIATRIC:  patient is alert and awake.  LABORATORY PANEL:  CBC No results for input(s): WBC, HGB, HCT, PLT in the last 168 hours.  Chemistries  Recent Labs  Lab 03/10/19 0904 03/13/19 0455  NA 145  --   K 4.2  --   CL 115*  --   CO2 20*  --   GLUCOSE 116*  --   BUN 60*  --   CREATININE 1.93* 1.63*  CALCIUM 7.9*  --    Cardiac Enzymes No results for input(s): TROPONINI in the last 168 hours. RADIOLOGY:  No results found. ASSESSMENT AND PLAN:  Nikoloz Kurowski is a 58 y.o. male with medical history significant for diabetes mellitus, seizure disorder and psychosis who lives in a group home who was sent over today, 11/2, with reports that he had not been acting well, with difficulty walking and reported fever.  Reportedly confused.    * E coli Sepsis ([place]) secondary to acute lower UTI with AKI due to ATN with sepsis -Patient meets criteria for sepsis on admission given lactic acidosis, acute kidney injury, tachycardia and leukocytosis.   -Blood cultures + Ecoli - Reviewed renal ultrasound, no evidence of the right obstruction. -CT renal study shows Mild to moderate left hydroureteronephrosis with perinephric and periureteric edema/stranding. 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  -recieved aggressive fluid resuscitation. -Baseline  creatinine around 1.5 -came in with creatinine of 3.49--- 3.62--1.93 (baseline 1.5-1.77)--1.63 -recieved IV fluids--tolerating po intake -nephrology input apprecaited --dr Holley Raring -pt has  good UOP  -avoid nephrotoxic agents  *Diabetes mellitus due to underlying condition with stage 2 chronic kidney disease,now with long-term current use of insulin ([place]): - Sliding scale and have ordered subcut Lantus at a decreased dose.  -A1c 5.7% -holding metformin due to CKD-II  * HTN -pt is on small dose of metoprolol -his HCTZ, losartan is held since bp is managed with small dose of metorpolol.   * chronic Psychosis ([place]): Continuing home meds abilify, seroquel, remeron and klonopin   *chronic Seizure disorder [facility]): Continue Dilantin -Dilantin level--11.6  *discharge disposition -- physical therapy recommends rehab.  -Social worker consulted --she will d/w group home manager to see if pt can return back to familiar environment.  Pt is medically stable for discharge. Awaiting for Hato Candal the group home manager to come evaluate patient to see if he can return back.  Case discussed with Care Management/Social Worker.  CODE STATUS: Full  DVT Prophylaxis: lovenox  TOTAL TIME TAKING CARE OF THIS PATIENT: *25* minutes.  >50% time spent on counselling and coordination of care  Note: This dictation was prepared with Dragon dictation along with smaller phrase technology. Any transcriptional errors that result from this process are unintentional.  Fritzi Mandes M.D on 03/14/2019 at 1:31 PM  Between 7am to 6pm - Pager - [phone]  After 6pm go to [email or web address]  [facility]   CC: Primary care physician; Marguerita Merles, MDPatient ID: [name], male   DOB: [birth year], 58 y.o.   MRN: [id number]

## 2019-03-15 DIAGNOSIS — R6510 Systemic inflammatory response syndrome (SIRS) of non-infectious origin without acute organ dysfunction: Secondary | ICD-10-CM

## 2019-03-15 LAB — GLUCOSE, CAPILLARY
Glucose-Capillary: 101 mg/dL — ABNORMAL HIGH (ref 70–99)
Glucose-Capillary: 126 mg/dL — ABNORMAL HIGH (ref 70–99)

## 2019-03-15 LAB — SARS CORONAVIRUS 2 (TAT 6-24 HRS): SARS Coronavirus 2: NEGATIVE

## 2019-03-15 MED ORDER — CEPHALEXIN 500 MG PO CAPS: 500.0000 mg | ORAL_CAPSULE | Freq: Four times a day (QID) | ORAL | 0 refills | Status: AC

## 2019-03-15 NOTE — TOC Transition Note (Signed)
Transition of Care [facility]) - CM/SW Discharge Note   Patient Details  Name: [name] MRN: [id number] Date of Birth: [birth year]  Transition of Care [facility]) CM/SW Contact:  Shelbie Hutching, RN Phone Number: 03/15/2019, 10:45 AM   Clinical Narrative:    Patient will discharge to [facility] today.  Patient will transport via [employer].  [facility] owner has notified patient's brother.  Patient will go to room 9B and bedside RN will call report to [phone].   Final next level of care: Skilled Nursing Facility Barriers to Discharge: Barriers Resolved   Patient Goals and CMS Choice   CMS Medicare.gov Compare Post Acute Care list provided to:: Patient Represenative (must comment) Choice offered to / list presented to : [facility] POA / Guardian([facility] - group home owner)  Discharge Placement              Patient chooses bed at: [facility] Patient to be transferred to facility by: [facility] Name of family member notified: Tawana Scale- and brother notified Patient and family notified of of transfer: 03/15/19  Discharge Plan and Services   Discharge Planning Services: CM Consult Post Acute Care Choice: [facility]                               Social Determinants of Health (SDOH) Interventions     Readmission Risk Interventions No flowsheet data found.

## 2019-03-15 NOTE — Discharge Summary (Signed)
Physician Discharge Summary  Jason Reed EEF:[id number] DOB: 24-Nov-1960 DOA: 03/06/2019  PCP: Marguerita Merles, MD  Admit date: 03/06/2019 Discharge date: 03/15/2019  Admitted From: group home  Disposition:  SNF  Recommendations for Outpatient Follow-up:  1. Follow up with PCP in 1-2 weeks 2. Please obtain BMP/CBC in one week  Home Health: none Equipment/Devices: none  Discharge Condition: stable CODE STATUS: Full code Diet recommendation: regular  HPI: Per admitting MD, Jason Reed is a 58 y.o. male with medical history significant for diabetes mellitus, seizure disorder and psychosis who lives in a group home who was sent over today, 11/2, with reports that he had not been acting well, with difficulty walking and reported fever.  Reportedly confused.  ED Course: The emergency room, patient found to have a mild UTI, but a white count of 17, lactic acid level of 2.3 and tachycardic.  Blood cultures were drawn and he was given IV Rocephin and given fluid boluses.  He was not hypoxic, but lab work did note acute kidney injury in the setting of his chronic kidney disease.  Pressures have since stabilized although still mildly tachycardic.  Hospitalist were called for further evaluation.  Hospital Course: Sepsis due to E. coli bacteremia in the setting of acute lower urinary tract infection-patient met sepsis criteria on admission given lactic acidosis, acute kidney injury, tachycardia, leukocytosis, he was placed on broad-spectrum antibiotics with ceftriaxone.  His blood culture speciated E. coli.  Given acute kidney injury he underwent a renal ultrasound and a CT renal which showed mild to moderate left hydroureteronephrosis with perinephric and periureteric edema/stranding.  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  Urology was consulted and followed patient while hospitalized, per their opinion this appears to be chronic.  He improved clinically on IV  ceftriaxone, given sensitivities will change to oral Keflex for a total of 10 days with 3 more days remaining on discharge. Acute kidney injury with underlying chronic kidney disease stage II -Baseline creatinine is around 1.5, his creatinine on admission was 3.5 likely in the setting of sepsis, this improved with IV fluids and currently creatinine is close to baseline at 1.6.  Continue to avoid nephrotoxic agents and monitor BMP periodically as an outpatient. Type 2 diabetes mellitus-A1c 5.7, continue home medications on discharge Hypertension-blood pressure controlled, continue home medications on discharge Chronic seizure disorder-continue home medications  Discharge Instructions   Allergies as of 03/15/2019      Reactions   Lisinopril    Unknown      Medication List    TAKE these medications   acetaminophen 325 MG tablet Commonly known as: TYLENOL Take 650 mg by mouth every 6 (six) hours as needed.   ARIPiprazole 15 MG tablet Commonly known as: ABILIFY Take 15 mg by mouth daily.   aspirin EC 81 MG tablet Take 81 mg by mouth daily.   cephALEXin 500 MG capsule Commonly known as: KEFLEX Take 1 capsule (500 mg total) by mouth every 6 (six) hours for 3 days.   clonazePAM 1 MG tablet Commonly known as: KLONOPIN Take 1 mg by mouth at bedtime.   Dilantin 100 MG ER capsule Generic drug: phenytoin Take 100-300 mg by mouth 2 (two) times daily. Take 100 mg by mouth in the morning and 300 mg by mouth at bedtime.   gabapentin 600 MG tablet Commonly known as: NEURONTIN Take 600 mg by mouth 3 (three) times daily.   guaiFENesin-dextromethorphan 100-10 MG/5ML syrup Commonly known as: ROBITUSSIN DM Take 10 mLs  by mouth every 4 (four) hours as needed for cough.   hydrochlorothiazide 25 MG tablet Commonly known as: HYDRODIURIL Take 25 mg by mouth daily.   hydrocortisone cream 1 % Apply 1 application topically 2 (two) times daily as needed.   loratadine 10 MG tablet Commonly  known as: CLARITIN Take 10 mg by mouth daily.   losartan 100 MG tablet Commonly known as: COZAAR Take 100 mg by mouth daily.   metFORMIN 500 MG tablet Commonly known as: GLUCOPHAGE Take 500 mg by mouth 2 (two) times daily with a meal.   metoprolol succinate 25 MG 24 hr tablet Commonly known as: TOPROL-XL Take 25 mg by mouth daily.   mirtazapine 15 MG disintegrating tablet Commonly known as: REMERON SOL-TAB Take 15 mg by mouth at bedtime.   psyllium 58.6 % powder Commonly known as: METAMUCIL Take 1 packet by mouth daily as needed.   QUEtiapine 400 MG tablet Commonly known as: SEROQUEL Take 800 mg by mouth at bedtime.   simvastatin 20 MG tablet Commonly known as: ZOCOR Take 20 mg by mouth at bedtime.   traZODone 50 MG tablet Commonly known as: DESYREL Take 50 mg by mouth at bedtime.      Contact information for after-discharge care    [facility] .   Service: Skilled Nursing Contact information: [place] [facility] [phone]              Consultations:  Urology  Nephrology  Procedures/Studies:  Dg Chest 2 View  Result Date: 03/06/2019 CLINICAL DATA:  Altered mental status EXAM: CHEST - 2 VIEW COMPARISON:  January 08, 2017 FINDINGS: No edema or consolidation. Heart size and pulmonary vascularity are normal. No adenopathy. There is degenerative change in thoracic spine. IMPRESSION: No edema or consolidation. Electronically Signed   By: Lowella Grip III M.D.   On: 03/06/2019 11:06   Dg Lumbar Spine Complete  Result Date: 03/03/2019 CLINICAL DATA:  Low back pain since waking this morning. No known injury. EXAM: LUMBAR SPINE - COMPLETE 4+ VIEW COMPARISON:  02/02/2012 FINDINGS: There are 5 non rib-bearing lumbar type vertebral bodies. There is unchanged straightening and slight reversal of the expected lumbar lordosis with mild kyphosis centered about the C3-C4 articulation. No  anterolisthesis or retrolisthesis. Mild (approximately 25%) compression deformity involving the L3 and L4 vertebral bodies appears similar to the 2013 examination. Remaining lumbar vertebral body heights appear preserved. Mild to moderate multilevel lumbar spine DDD, worse at L4-L5 and L5-S1 with disc space height loss, endplate irregularity and sclerosis. Limited visualization of the bilateral SI joints is normal. Large colonic stool burden without evidence of enteric obstruction IMPRESSION: 1. No acute findings. 2. Mild-to-moderate multilevel lumbar spine DDD, worse at L4-L5 and L5-S1, similar to the 02/2012 examination Electronically Signed   By: Sandi Mariscal M.D.   On: 03/03/2019 13:05   US Renal  Result Date: 03/06/2019 CLINICAL DATA:  Acute kidney injury. History of a right nephrectomy. EXAM: RENAL / URINARY TRACT ULTRASOUND COMPLETE COMPARISON:  CT, 01/08/2017 FINDINGS: Right Kidney: Surgically absent.  No mass in the right renal fossa. Left Kidney: Renal measurements: 13.1 x 6.6 x 6.4 cm = volume: 290 mL. Normal parenchymal echogenicity. Mild dilation of the intrarenal collecting system. No mass or stone. Bladder: Small outpouching from the right posterior bladder, which may be at the right ureteral trigone. This contain some apparent debris. No other bladder abnormality. Other: None. IMPRESSION: 1. Mild dilation of the left  intrarenal collecting system. This may be physiologic. Mild hydronephrosis from partial obstruction is possible, and should be considered if there are consistent clinical findings. No other left kidney abnormality. 2. Status post right nephrectomy. 3. Small diverticulum containing debris from the right posterior bladder. Electronically Signed   By: Lajean Manes M.D.   On: 03/06/2019 15:57   Ct Renal Stone Study  Result Date: 03/09/2019 CLINICAL DATA:  Hydronephrosis. EXAM: CT ABDOMEN AND PELVIS WITHOUT CONTRAST TECHNIQUE: Multidetector CT imaging of the abdomen and pelvis was  performed following the standard protocol without IV contrast. COMPARISON:  01/08/2017 FINDINGS: Lower chest: Basilar atelectasis bilaterally. Hepatobiliary: The liver shows diffusely decreased attenuation suggesting fat deposition. Tiny hypodensity anterior liver on 21/2 is similar to prior. Gallbladder not visualized and may be surgically absent. No intrahepatic or extrahepatic biliary dilation. Pancreas: No focal mass lesion. No dilatation of the main duct. No intraparenchymal cyst. No peripancreatic edema. Spleen: No splenomegaly. No focal mass lesion. Adrenals/Urinary Tract: No adrenal nodule or mass. Absent right kidney. Mild to moderate left hydroureteronephrosis. Left kidney demonstrates duplicated collecting system with atrophy of the upper pole moiety. High attenuation material in the upper pole collecting system may represent stones/stone debris. This is not well evaluated on noncontrast imaging. There is perinephric and proximal periureteric edema/stranding. Posterior bladder wall irregularity evident. Stomach/Bowel: Stomach is decompressed. Duodenum is normally positioned as is the ligament of Treitz. No small bowel wall thickening. No small bowel dilatation. Colon is diffusely dilated with air and stool, similar to prior. Vascular/Lymphatic: No abdominal aortic aneurysm. No abdominal lymphadenopathy No pelvic sidewall lymphadenopathy. Reproductive: The prostate gland and seminal vesicles are unremarkable. Other: No intraperitoneal free fluid. Musculoskeletal: No worrisome lytic or sclerotic osseous abnormality. IMPRESSION: 1. Mild to moderate left hydroureteronephrosis with perinephric and periureteric edema/stranding. 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 High attenuation material in the left upper pole collecting system may represent stones/stone debris and is stable in the interval since prior study. 2. Absent right kidney. 3. Diffuse colonic distention with air and  stool, similar to prior. Colonic dysmotility would be a consideration. 4. Hepatic steatosis. Electronically Signed   By: Misty Stanley M.D.   On: 03/09/2019 12:12     Subjective: - no chest pain, shortness of breath, no abdominal pain, nausea or vomiting.   Discharge Exam: BP 121/70 (BP Location: Right Arm)    Pulse (!) 105    Temp 97.7 F (36.5 C) (Oral)    Resp 18    Ht '6\' 1"'  (1.854 m)    Wt 115.4 kg    SpO2 96%    BMI 33.58 kg/m   General: Pt is alert, awake, not in acute distress Cardiovascular: RRR, S1/S2 +, no rubs, no gallops Respiratory: CTA bilaterally, no wheezing, no rhonchi Abdominal: Soft, NT, ND, bowel sounds + Extremities: no edema, no cyanosis    The results of significant diagnostics from this hospitalization (including imaging, microbiology, ancillary and laboratory) are listed below for reference.     Microbiology: Recent Results (from the past 240 hour(s))  SARS CORONAVIRUS 2 (TAT 6-24 HRS) Nasopharyngeal Nasopharyngeal Swab     Status: None   Collection Time: 03/06/19 11:11 AM   Specimen: Nasopharyngeal Swab  Result Value Ref Range Status   SARS Coronavirus 2 NEGATIVE NEGATIVE Final    Comment: (NOTE) SARS-CoV-2 target nucleic acids are NOT DETECTED. The SARS-CoV-2 RNA is generally detectable in upper and lower respiratory specimens during the acute phase of infection. Negative results do not preclude SARS-CoV-2 infection, do  not rule out co-infections with other pathogens, and should not be used as the sole basis for treatment or other patient management decisions. Negative results must be combined with clinical observations, patient history, and epidemiological information. The expected result is Negative. Fact Sheet for Patients: SugarRoll.be Fact Sheet for Healthcare Providers: https://www.woods-mathews.com/ This test is not yet approved or cleared by the [place] FDA and  has been authorized for  detection and/or diagnosis of SARS-CoV-2 by FDA under an Emergency Use Authorization (EUA). This EUA will remain  in effect (meaning this test can be used) for the duration of the COVID-19 declaration under Section 56 4(b)(1) of the Act, 21 U.S.C. section 360bbb-3(b)(1), unless the authorization is terminated or revoked sooner. Performed at [facility], [place] [address], [place], [place] [address]   Urine Culture     Status: Abnormal   Collection Time: 03/06/19 11:11 AM   Specimen: Urine, Random  Result Value Ref Range Status   Specimen Description   Final    URINE, RANDOM Performed at [facility], [address] [place], [place] [address]    Special Requests   Final    NONE Performed at [facility], [address] [place], [place] [address]    Culture >=100,000 COLONIES/mL ESCHERICHIA COLI (A)  Final   Report Status 03/08/2019 FINAL  Final   Organism ID, Bacteria ESCHERICHIA COLI (A)  Final      Susceptibility   Escherichia coli - MIC*    AMPICILLIN >=32 RESISTANT Resistant     CEFAZOLIN <=4 SENSITIVE Sensitive     CEFTRIAXONE <=1 SENSITIVE Sensitive     CIPROFLOXACIN <=0.25 SENSITIVE Sensitive     GENTAMICIN <=1 SENSITIVE Sensitive     IMIPENEM <=0.25 SENSITIVE Sensitive     NITROFURANTOIN <=16 SENSITIVE Sensitive     TRIMETH/SULFA <=20 SENSITIVE Sensitive     AMPICILLIN/SULBACTAM >=32 RESISTANT Resistant     PIP/TAZO <=4 SENSITIVE Sensitive     Extended ESBL NEGATIVE Sensitive     * >=100,000 COLONIES/mL ESCHERICHIA COLI  Blood Culture (routine x 2)     Status: Abnormal   Collection Time: 03/06/19 12:06 PM   Specimen: BLOOD  Result Value Ref Range Status   Specimen Description   Final    BLOOD BLOOD LEFT HAND Performed at [facility], [address], [place], [place] [address]    Special Requests   Final    BOTTLES DRAWN AEROBIC AND ANAEROBIC Blood Culture adequate volume Performed at [facility], [address] [place], [place] [address]    Culture  Setup Time   Final    GRAM NEGATIVE RODS IN BOTH AEROBIC AND ANAEROBIC BOTTLES CRITICAL RESULT CALLED TO, READ BACK BY AND VERIFIED WITH: DAVID BESANTI AT 2355 ON 03/06/19 RWW Performed at [facility], [address] [place], [place] [address]    Culture (A)  Final    ESCHERICHIA COLI SUSCEPTIBILITIES PERFORMED ON PREVIOUS CULTURE WITHIN THE LAST 5 DAYS. Performed at [facility], [place] [address] [place], [place] [address]    Report Status 03/10/2019 FINAL  Final  Blood Culture (routine x 2)     Status: Abnormal   Collection Time: 03/06/19 12:06 PM   Specimen: BLOOD  Result Value Ref Range Status   Specimen Description   Final    BLOOD BLOOD RIGHT HAND Performed at [facility], [address], [place],  [address]    Special Requests   Final    BOTTLES  DRAWN AEROBIC AND ANAEROBIC Blood Culture adequate volume Performed at [facility], [address] [place], [place] [address]    Culture  Setup Time   Final    GRAM NEGATIVE RODS IN BOTH AEROBIC AND ANAEROBIC BOTTLES CRITICAL RESULT CALLED TO, READ BACK BY AND VERIFIED WITH: [place] AT 2355 ON 03/06/19 RWW Performed at [facility], [place] [address] [place], [place] [address]    Culture ESCHERICHIA COLI (A)  Final   Report Status 03/10/2019 FINAL  Final   Organism ID, Bacteria ESCHERICHIA COLI  Final      Susceptibility   Escherichia coli - MIC*    AMPICILLIN >=32 RESISTANT Resistant     CEFAZOLIN <=4 SENSITIVE Sensitive     CEFEPIME <=1 SENSITIVE Sensitive     CEFTAZIDIME <=1 SENSITIVE Sensitive     CEFTRIAXONE <=1 SENSITIVE Sensitive     CIPROFLOXACIN <=0.25 SENSITIVE Sensitive     GENTAMICIN <=1 SENSITIVE Sensitive     IMIPENEM <=0.25 SENSITIVE Sensitive     TRIMETH/SULFA <=20 SENSITIVE Sensitive     AMPICILLIN/SULBACTAM >=32 RESISTANT Resistant     PIP/TAZO <=4 SENSITIVE Sensitive     Extended ESBL NEGATIVE Sensitive     *  ESCHERICHIA COLI  Blood Culture ID Panel (Reflexed)     Status: Abnormal   Collection Time: 03/06/19 12:06 PM  Result Value Ref Range Status   Enterococcus species NOT DETECTED NOT DETECTED Final   Listeria monocytogenes NOT DETECTED NOT DETECTED Final   Staphylococcus species NOT DETECTED NOT DETECTED Final   Staphylococcus aureus (BCID) NOT DETECTED NOT DETECTED Final   Streptococcus species NOT DETECTED NOT DETECTED Final   Streptococcus agalactiae NOT DETECTED NOT DETECTED Final   Streptococcus pneumoniae NOT DETECTED NOT DETECTED Final   Streptococcus pyogenes NOT DETECTED NOT DETECTED Final   Acinetobacter baumannii NOT DETECTED NOT DETECTED Final   Enterobacteriaceae species DETECTED (A) NOT DETECTED Final    Comment: Enterobacteriaceae represent a large family of gram-negative bacteria, not a single organism. CRITICAL RESULT CALLED TO, READ BACK BY AND VERIFIED WITH: DAVID BESANTI AT 2355 ON 03/06/19 RWW    Enterobacter cloacae complex NOT DETECTED NOT DETECTED Final   Escherichia coli DETECTED (A) NOT DETECTED Final    Comment: CRITICAL RESULT CALLED TO, READ BACK BY AND VERIFIED WITH: DAVID BESANTI AT 2355 ON 03/06/19 RWW    Klebsiella oxytoca NOT DETECTED NOT DETECTED Final   Klebsiella pneumoniae NOT DETECTED NOT DETECTED Final   Proteus species NOT DETECTED NOT DETECTED Final   Serratia marcescens NOT DETECTED NOT DETECTED Final   Carbapenem resistance NOT DETECTED NOT DETECTED Final   Haemophilus influenzae NOT DETECTED NOT DETECTED Final   Neisseria meningitidis NOT DETECTED NOT DETECTED Final   Pseudomonas aeruginosa NOT DETECTED NOT DETECTED Final   Candida albicans NOT DETECTED NOT DETECTED Final   Candida glabrata NOT DETECTED NOT DETECTED Final   Candida krusei NOT DETECTED NOT DETECTED Final   Candida parapsilosis NOT DETECTED NOT DETECTED Final   Candida tropicalis NOT DETECTED NOT DETECTED Final    Comment: Performed at [facility], [address] [place], [place] [address]  MRSA PCR Screening     Status: None   Collection Time: 03/07/19  2:05 AM   Specimen: Nasal Mucosa; Nasopharyngeal  Result Value Ref Range Status   MRSA by PCR NEGATIVE NEGATIVE Final    Comment:        The GeneXpert MRSA Assay (FDA approved for NASAL specimens only), is one component  of a comprehensive MRSA colonization surveillance program. It is not intended to diagnose MRSA infection nor to guide or monitor treatment for MRSA infections. Performed at [facility], [place], [place] [address]   SARS CORONAVIRUS 2 (TAT 6-24 HRS) Nasopharyngeal Nasopharyngeal Swab     Status: None   Collection Time: 03/14/19  3:59 PM   Specimen: Nasopharyngeal Swab  Result Value Ref Range Status   SARS Coronavirus 2 NEGATIVE NEGATIVE Final    Comment: (NOTE) SARS-CoV-2 target nucleic acids are NOT DETECTED. The SARS-CoV-2 RNA is generally detectable in upper and lower respiratory specimens during the acute phase of infection. Negative results do not preclude SARS-CoV-2 infection, do not rule out co-infections with other pathogens, and should not be used as the sole basis for treatment or other patient management decisions. Negative results must be combined with clinical observations, patient history, and epidemiological information. The expected result is Negative. Fact Sheet for Patients: SugarRoll.be Fact Sheet for Healthcare Providers: https://www.woods-mathews.com/ This test is not yet approved or cleared by the [place] FDA and  has been authorized for detection and/or diagnosis of SARS-CoV-2 by FDA under an Emergency Use Authorization (EUA). This EUA will remain  in effect (meaning this test can be used) for the duration of the COVID-19 declaration under Section 56 4(b)(1) of the Act, 21 U.S.C. section 360bbb-3(b)(1), unless the authorization is terminated or revoked sooner. Performed at  [facility], [place] [address], [place], [place] [address]      Labs: Basic Metabolic Panel: Recent Labs  Lab 03/10/19 0904 03/13/19 0455  NA 145  --   K 4.2  --   CL 115*  --   CO2 20*  --   GLUCOSE 116*  --   BUN 60*  --   CREATININE 1.93* 1.63*  CALCIUM 7.9*  --   PHOS 2.8  --    Liver Function Tests: Recent Labs  Lab 03/10/19 0904  ALBUMIN 2.2*   CBC: No results for input(s): WBC, NEUTROABS, HGB, HCT, MCV, PLT in the last 168 hours. CBG: Recent Labs  Lab 03/14/19 0749 03/14/19 1156 03/14/19 1645 03/14/19 2136 03/15/19 0741  GLUCAP 109* 155* 129* 102* 101*   Hgb A1c No results for input(s): HGBA1C in the last 72 hours. Lipid Profile No results for input(s): CHOL, HDL, LDLCALC, TRIG, CHOLHDL, LDLDIRECT in the last 72 hours. Thyroid function studies No results for input(s): TSH, T4TOTAL, T3FREE, THYROIDAB in the last 72 hours.  Invalid input(s): FREET3 Urinalysis    Component Value Date/Time   COLORURINE YELLOW (A) 03/06/2019 1017   APPEARANCEUR CLOUDY (A) 03/06/2019 1017   LABSPEC 1.016 03/06/2019 1017   PHURINE 5.0 03/06/2019 1017   GLUCOSEU >=500 (A) 03/06/2019 1017   HGBUR LARGE (A) 03/06/2019 1017   BILIRUBINUR NEGATIVE 03/06/2019 1017   KETONESUR 5 (A) 03/06/2019 1017   PROTEINUR 100 (A) 03/06/2019 1017   NITRITE NEGATIVE 03/06/2019 1017   LEUKOCYTESUR SMALL (A) 03/06/2019 1017    FURTHER DISCHARGE INSTRUCTIONS:   Get Medicines reviewed and adjusted: Please take all your medications with you for your next visit with your Primary MD   Laboratory/radiological data: Please request your Primary MD to go over all hospital tests and procedure/radiological results at the follow up, please ask your Primary MD to get all Hospital records sent to his/her office.   In some cases, they will be blood work, cultures and biopsy results pending at the time of your discharge. Please request that your primary care  M.D. goes through all the records of  your hospital data and follows up on these results.   Also Note the following: If you experience worsening of your admission symptoms, develop shortness of breath, life threatening emergency, suicidal or homicidal thoughts you must seek medical attention immediately by calling 911 or calling your MD immediately  if symptoms less severe.   You must read complete instructions/literature along with all the possible adverse reactions/side effects for all the Medicines you take and that have been prescribed to you. Take any new Medicines after you have completely understood and accpet all the possible adverse reactions/side effects.    Do not drive when taking Pain medications or sleeping medications (Benzodaizepines)   Do not take more than prescribed Pain, Sleep and Anxiety Medications. It is not advisable to combine anxiety,sleep and pain medications without talking with your primary care practitioner   Special Instructions: If you have smoked or chewed Tobacco  in the last 2 yrs please stop smoking, stop any regular Alcohol  and or any Recreational drug use.   Wear Seat belts while driving.   Please note: You were cared for by a hospitalist during your hospital stay. Once you are discharged, your primary care physician will handle any further medical issues. Please note that NO REFILLS for any discharge medications will be authorized once you are discharged, as it is imperative that you return to your primary care physician (or establish a relationship with a primary care physician if you do not have one) for your post hospital discharge needs so that they can reassess your need for medications and monitor your lab values.  Time coordinating discharge: 40 minutes  SIGNED:  Marzetta Board, MD, PhD 03/15/2019, 9:46 AM

## 2019-03-15 NOTE — Progress Notes (Signed)
EMS contacted for transport.  Danielle Zyan Coby, BSN 

## 2019-04-09 ENCOUNTER — Inpatient Hospital Stay
Admission: EM | Admit: 2019-04-09 | Discharge: 2019-04-11 | DRG: 641 | Disposition: A | Discharge status: Home Health | Payer: Medicare Other | Attending: Family Medicine | Admitting: Family Medicine

## 2019-04-09 ENCOUNTER — Encounter: Payer: Self-pay | Admitting: Intensive Care

## 2019-04-09 ENCOUNTER — Observation Stay: Payer: Medicare Other

## 2019-04-09 ENCOUNTER — Emergency Department: Payer: Medicare Other

## 2019-04-09 ENCOUNTER — Other Ambulatory Visit: Payer: Self-pay

## 2019-04-09 DIAGNOSIS — R6250 Unspecified lack of expected normal physiological development in childhood: Secondary | ICD-10-CM | POA: Diagnosis present

## 2019-04-09 DIAGNOSIS — E785 Hyperlipidemia, unspecified: Secondary | ICD-10-CM | POA: Diagnosis present

## 2019-04-09 DIAGNOSIS — G40909 Epilepsy, unspecified, not intractable, without status epilepticus: Secondary | ICD-10-CM | POA: Diagnosis present

## 2019-04-09 DIAGNOSIS — N179 Acute kidney failure, unspecified: Secondary | ICD-10-CM | POA: Diagnosis present

## 2019-04-09 DIAGNOSIS — Z7984 Long term (current) use of oral hypoglycemic drugs: Secondary | ICD-10-CM

## 2019-04-09 DIAGNOSIS — E876 Hypokalemia: Secondary | ICD-10-CM | POA: Diagnosis present

## 2019-04-09 DIAGNOSIS — R4189 Other symptoms and signs involving cognitive functions and awareness: Secondary | ICD-10-CM | POA: Diagnosis present

## 2019-04-09 DIAGNOSIS — F29 Unspecified psychosis not due to a substance or known physiological condition: Secondary | ICD-10-CM | POA: Diagnosis present

## 2019-04-09 DIAGNOSIS — R29898 Other symptoms and signs involving the musculoskeletal system: Secondary | ICD-10-CM | POA: Diagnosis not present

## 2019-04-09 DIAGNOSIS — I10 Essential (primary) hypertension: Secondary | ICD-10-CM | POA: Diagnosis present

## 2019-04-09 DIAGNOSIS — N183 Chronic kidney disease, stage 3 unspecified: Secondary | ICD-10-CM

## 2019-04-09 DIAGNOSIS — E860 Dehydration: Secondary | ICD-10-CM | POA: Diagnosis not present

## 2019-04-09 DIAGNOSIS — Z888 Allergy status to other drugs, medicaments and biological substances status: Secondary | ICD-10-CM

## 2019-04-09 DIAGNOSIS — W19XXXA Unspecified fall, initial encounter: Secondary | ICD-10-CM | POA: Diagnosis present

## 2019-04-09 DIAGNOSIS — G459 Transient cerebral ischemic attack, unspecified: Secondary | ICD-10-CM

## 2019-04-09 DIAGNOSIS — N1832 Chronic kidney disease, stage 3b: Secondary | ICD-10-CM | POA: Diagnosis not present

## 2019-04-09 DIAGNOSIS — G8314 Monoplegia of lower limb affecting left nondominant side: Secondary | ICD-10-CM | POA: Diagnosis present

## 2019-04-09 DIAGNOSIS — E0822 Diabetes mellitus due to underlying condition with diabetic chronic kidney disease: Secondary | ICD-10-CM

## 2019-04-09 DIAGNOSIS — E872 Acidosis, unspecified: Secondary | ICD-10-CM | POA: Diagnosis present

## 2019-04-09 DIAGNOSIS — Z5309 Procedure and treatment not carried out because of other contraindication: Secondary | ICD-10-CM

## 2019-04-09 DIAGNOSIS — Z79899 Other long term (current) drug therapy: Secondary | ICD-10-CM

## 2019-04-09 DIAGNOSIS — Z20828 Contact with and (suspected) exposure to other viral communicable diseases: Secondary | ICD-10-CM | POA: Diagnosis present

## 2019-04-09 DIAGNOSIS — E669 Obesity, unspecified: Secondary | ICD-10-CM | POA: Diagnosis present

## 2019-04-09 DIAGNOSIS — Z79891 Long term (current) use of opiate analgesic: Secondary | ICD-10-CM

## 2019-04-09 DIAGNOSIS — Z23 Encounter for immunization: Secondary | ICD-10-CM

## 2019-04-09 DIAGNOSIS — N189 Chronic kidney disease, unspecified: Secondary | ICD-10-CM | POA: Diagnosis present

## 2019-04-09 DIAGNOSIS — Z794 Long term (current) use of insulin: Secondary | ICD-10-CM

## 2019-04-09 DIAGNOSIS — Z7982 Long term (current) use of aspirin: Secondary | ICD-10-CM

## 2019-04-09 DIAGNOSIS — N182 Chronic kidney disease, stage 2 (mild): Secondary | ICD-10-CM

## 2019-04-09 DIAGNOSIS — F39 Unspecified mood [affective] disorder: Secondary | ICD-10-CM | POA: Diagnosis present

## 2019-04-09 DIAGNOSIS — I129 Hypertensive chronic kidney disease with stage 1 through stage 4 chronic kidney disease, or unspecified chronic kidney disease: Secondary | ICD-10-CM | POA: Diagnosis present

## 2019-04-09 DIAGNOSIS — E1122 Type 2 diabetes mellitus with diabetic chronic kidney disease: Secondary | ICD-10-CM | POA: Diagnosis present

## 2019-04-09 DIAGNOSIS — D631 Anemia in chronic kidney disease: Secondary | ICD-10-CM | POA: Diagnosis present

## 2019-04-09 LAB — BASIC METABOLIC PANEL
Anion gap: 17 — ABNORMAL HIGH (ref 5–15)
BUN: 35 mg/dL — ABNORMAL HIGH (ref 6–20)
CO2: 21 mmol/L — ABNORMAL LOW (ref 22–32)
Calcium: 8.6 mg/dL — ABNORMAL LOW (ref 8.9–10.3)
Chloride: 95 mmol/L — ABNORMAL LOW (ref 98–111)
Creatinine, Ser: 2.46 mg/dL — ABNORMAL HIGH (ref 0.61–1.24)
GFR calc Af Amer: 32 mL/min — ABNORMAL LOW (ref >60)
GFR calc non Af Amer: 27 mL/min — ABNORMAL LOW (ref >60)
Glucose, Bld: 143 mg/dL — ABNORMAL HIGH (ref 70–99)
Potassium: 3.1 mmol/L — ABNORMAL LOW (ref 3.5–5.1)
Sodium: 133 mmol/L — ABNORMAL LOW (ref 135–145)

## 2019-04-09 LAB — URINALYSIS, COMPLETE (UACMP) WITH MICROSCOPIC
Bilirubin Urine: NEGATIVE
Glucose, UA: >=500 mg/dL — AB
Ketones, ur: NEGATIVE mg/dL
Nitrite: NEGATIVE
Protein, ur: NEGATIVE mg/dL
Specific Gravity, Urine: 1.015 (ref 1.005–1.030)
pH: 5 (ref 5.0–8.0)

## 2019-04-09 LAB — CBC
HCT: 30.2 % — ABNORMAL LOW (ref 39.0–52.0)
Hemoglobin: 9.5 g/dL — ABNORMAL LOW (ref 13.0–17.0)
MCH: 28.4 pg (ref 26.0–34.0)
MCHC: 31.5 g/dL (ref 30.0–36.0)
MCV: 90.1 fL (ref 80.0–100.0)
Platelets: 270 K/uL (ref 150–400)
RBC: 3.35 MIL/uL — ABNORMAL LOW (ref 4.22–5.81)
RDW: 14.1 % (ref 11.5–15.5)
WBC: 10.1 K/uL (ref 4.0–10.5)
nRBC: 0 % (ref 0.0–0.2)

## 2019-04-09 LAB — GLUCOSE, CAPILLARY: Glucose-Capillary: 119 mg/dL — ABNORMAL HIGH (ref 70–99)

## 2019-04-09 LAB — HEPATIC FUNCTION PANEL
ALT: 23 U/L (ref 0–44)
AST: 35 U/L (ref 15–41)
Albumin: 3.3 g/dL — ABNORMAL LOW (ref 3.5–5.0)
Alkaline Phosphatase: 109 U/L (ref 38–126)
Bilirubin, Direct: 0.1 mg/dL (ref 0.0–0.2)
Indirect Bilirubin: 0.3 mg/dL (ref 0.3–0.9)
Total Bilirubin: 0.4 mg/dL (ref 0.3–1.2)
Total Protein: 7.5 g/dL (ref 6.5–8.1)

## 2019-04-09 LAB — LACTIC ACID, PLASMA
Lactic Acid, Venous: 3.9 mmol/L (ref 0.5–1.9)
Lactic Acid, Venous: 2.7 mmol/L (ref 0.5–1.9)

## 2019-04-09 LAB — SARS CORONAVIRUS 2 (TAT 6-24 HRS): SARS Coronavirus 2: NEGATIVE

## 2019-04-09 MED ORDER — INSULIN ASPART 100 UNIT/ML ~~LOC~~ SOLN
0.0000 [IU] | Freq: Every day | SUBCUTANEOUS | Status: DC
  Filled 2019-04-09: qty 1

## 2019-04-09 MED ORDER — STROKE: EARLY STAGES OF RECOVERY BOOK
Freq: Once | Status: AC
  Administered 2019-04-10

## 2019-04-09 MED ORDER — TRAZODONE HCL 50 MG PO TABS
50.0000 mg | ORAL_TABLET | Freq: Every day | ORAL | Status: DC
  Administered 2019-04-09 – 2019-04-10 (×2): 50 mg via ORAL
  Filled 2019-04-09 (×3): qty 1

## 2019-04-09 MED ORDER — ACETAMINOPHEN 325 MG PO TABS: 650.0000 mg | ORAL_TABLET | Freq: Four times a day (QID) | ORAL | Status: DC | PRN

## 2019-04-09 MED ORDER — ENOXAPARIN SODIUM 40 MG/0.4ML ~~LOC~~ SOLN
40.0000 mg | SUBCUTANEOUS | Status: DC
  Administered 2019-04-09 – 2019-04-10 (×2): 40 mg via SUBCUTANEOUS
  Filled 2019-04-09 (×3): qty 0.4

## 2019-04-09 MED ORDER — LORAZEPAM 2 MG/ML IJ SOLN: 0.5000 mg | Freq: Once | INTRAMUSCULAR | Status: DC | PRN

## 2019-04-09 MED ORDER — POTASSIUM CHLORIDE IN NACL 20-0.9 MEQ/L-% IV SOLN
INTRAVENOUS | Status: DC
  Administered 2019-04-10 (×2): via INTRAVENOUS
  Filled 2019-04-09 (×8): qty 1000

## 2019-04-09 MED ORDER — MIRTAZAPINE 15 MG PO TBDP
15.0000 mg | ORAL_TABLET | Freq: Every day | ORAL | Status: DC
  Filled 2019-04-09 (×3): qty 1

## 2019-04-09 MED ORDER — ARIPIPRAZOLE 15 MG PO TABS
15.0000 mg | ORAL_TABLET | Freq: Every day | ORAL | Status: DC
  Administered 2019-04-10 – 2019-04-11 (×2): 15 mg via ORAL
  Filled 2019-04-09 (×2): qty 1

## 2019-04-09 MED ORDER — ONDANSETRON HCL 4 MG/2ML IJ SOLN: 4.0000 mg | Freq: Four times a day (QID) | INTRAMUSCULAR | Status: DC | PRN

## 2019-04-09 MED ORDER — GABAPENTIN 600 MG PO TABS
600.0000 mg | ORAL_TABLET | Freq: Three times a day (TID) | ORAL | Status: DC
  Administered 2019-04-09 – 2019-04-11 (×5): 600 mg via ORAL
  Filled 2019-04-09 (×7): qty 1

## 2019-04-09 MED ORDER — PHENYTOIN SODIUM EXTENDED 100 MG PO CAPS
300.0000 mg | ORAL_CAPSULE | Freq: Every day | ORAL | Status: DC
  Administered 2019-04-09 – 2019-04-10 (×2): 300 mg via ORAL
  Filled 2019-04-09 (×4): qty 3

## 2019-04-09 MED ORDER — SODIUM CHLORIDE 0.9 % IV BOLUS
1000.0000 mL | Freq: Once | INTRAVENOUS | Status: AC
  Administered 2019-04-09: 16:00:00 1000 mL via INTRAVENOUS

## 2019-04-09 MED ORDER — SODIUM CHLORIDE 0.9 % IV SOLN: INTRAVENOUS | Status: DC | PRN

## 2019-04-09 MED ORDER — CLONAZEPAM 1 MG PO TABS
1.0000 mg | ORAL_TABLET | Freq: Every day | ORAL | Status: DC
  Administered 2019-04-09 – 2019-04-10 (×2): 1 mg via ORAL
  Filled 2019-04-09 (×2): qty 1
  Filled 2019-04-09: qty 2

## 2019-04-09 MED ORDER — ASPIRIN 325 MG PO TABS
325.0000 mg | ORAL_TABLET | Freq: Every day | ORAL | Status: DC
  Administered 2019-04-10 – 2019-04-11 (×2): 325 mg via ORAL
  Filled 2019-04-09 (×3): qty 1

## 2019-04-09 MED ORDER — PHENYTOIN SODIUM EXTENDED 100 MG PO CAPS
100.0000 mg | ORAL_CAPSULE | Freq: Every morning | ORAL | Status: DC
  Administered 2019-04-10 – 2019-04-11 (×2): 100 mg via ORAL
  Filled 2019-04-09 (×2): qty 1

## 2019-04-09 MED ORDER — INSULIN ASPART 100 UNIT/ML ~~LOC~~ SOLN
0.0000 [IU] | Freq: Three times a day (TID) | SUBCUTANEOUS | Status: DC
  Administered 2019-04-10: 2 [IU] via SUBCUTANEOUS
  Administered 2019-04-10 – 2019-04-11 (×2): 3 [IU] via SUBCUTANEOUS
  Filled 2019-04-09 (×2): qty 1

## 2019-04-09 MED ORDER — SIMVASTATIN 20 MG PO TABS
20.0000 mg | ORAL_TABLET | Freq: Every day | ORAL | Status: DC
  Administered 2019-04-09 – 2019-04-10 (×2): 20 mg via ORAL
  Filled 2019-04-09: qty 1
  Filled 2019-04-09: qty 2
  Filled 2019-04-09: qty 1

## 2019-04-09 MED ORDER — ASPIRIN 300 MG RE SUPP
300.0000 mg | Freq: Every day | RECTAL | Status: DC
  Filled 2019-04-09: qty 1

## 2019-04-09 MED ORDER — QUETIAPINE FUMARATE 300 MG PO TABS
800.0000 mg | ORAL_TABLET | Freq: Every day | ORAL | Status: DC
  Administered 2019-04-09 – 2019-04-10 (×2): 800 mg via ORAL
  Filled 2019-04-09 (×3): qty 1
  Filled 2019-04-09: qty 2

## 2019-04-09 MED ORDER — ONDANSETRON HCL 4 MG PO TABS
4.0000 mg | ORAL_TABLET | Freq: Four times a day (QID) | ORAL | Status: DC | PRN
  Filled 2019-04-09: qty 1

## 2019-04-09 MED ORDER — ACETAMINOPHEN 650 MG RE SUPP: 650.0000 mg | Freq: Four times a day (QID) | RECTAL | Status: DC | PRN

## 2019-04-09 NOTE — ED Triage Notes (Signed)
Pt c/o weakness in left leg and falling this AM. Staff member from group home reports it took three of them to get him up and into the car. Patient baseline ambulates with walker.

## 2019-04-09 NOTE — ED Notes (Signed)
Patient transported to X-ray 

## 2019-04-09 NOTE — Progress Notes (Signed)
Declined report at this time, waiting to transfer another patient off the unit before accepting patient.

## 2019-04-09 NOTE — ED Notes (Signed)
Pt ambulated well with walker- pt states his left leg is feeling better

## 2019-04-09 NOTE — ED Notes (Signed)
Lab at bedside to get lactic

## 2019-04-09 NOTE — ED Notes (Signed)
ED TO INPATIENT HANDOFF REPORT  ED Nurse Name and Phone #: Jasiyah Paulding [phone]  S Name/Age/Gender [name] 58 y.o. male Room/Bed: ED34A/ED34A  Code Status   Code Status: Full Code  Home/SNF/Other Home Patient oriented to: self, place, time and situation Is this baseline? Yes   Triage Complete: Triage complete  Chief Complaint Weakness Left leg  Triage Note Pt c/o weakness in left leg and falling this AM. Staff member from group home reports it took three of them to get him up and into the car. Patient baseline ambulates with walker.    Allergies Allergies  Allergen Reactions  . Lisinopril     Unknown    Level of Care/Admitting Diagnosis ED Disposition    ED Disposition Condition [facility]: [place] [[id number]]  Level of Care: Med-Surg [16]  Covid Evaluation: Asymptomatic Screening Protocol (No Symptoms)  Diagnosis: Left leg weakness [id number]  Admitting Physician: Edwin Dada [id number]  Attending Physician: Edwin Dada [id number]  PT Class (Do Not Modify): Observation [104]  PT Acc Code (Do Not Modify): Observation [10022]       B Medical/Surgery History Past Medical History:  Diagnosis Date  . Diabetes ([place])   . HTN (hypertension)   . Hyperlipidemia   . Psychosis ([employer])   . Seizure [facility])    Past Surgical History:  Procedure Laterality Date  . COLONOSCOPY WITH PROPOFOL N/A 12/22/2018   Procedure: COLONOSCOPY WITH PROPOFOL;  Surgeon: Virgel Manifold, MD;  Location: [facility] ENDOSCOPY;  Service: Endoscopy;  Laterality: N/A;  . COLONOSCOPY WITH PROPOFOL N/A 12/23/2018   Procedure: COLONOSCOPY WITH PROPOFOL;  Surgeon: Virgel Manifold, MD;  Location: [facility] ENDOSCOPY;  Service: Endoscopy;  Laterality: N/A;  . ESOPHAGOGASTRODUODENOSCOPY (EGD) WITH PROPOFOL N/A 12/22/2018   Procedure: ESOPHAGOGASTRODUODENOSCOPY (EGD) WITH PROPOFOL;  Surgeon: Virgel Manifold, MD;  Location: [facility] ENDOSCOPY;   Service: Endoscopy;  Laterality: N/A;     A IV Location/Drains/Wounds Patient Lines/Drains/Airways Status   Active Line/Drains/Airways    Name:   Placement date:   Placement time:   Site:   Days:   Peripheral IV 04/09/19 Left Forearm   04/09/19    1540    Forearm   less than 1   Pressure Injury 03/14/19 Sacrum Mid Stage II -  Partial thickness loss of dermis presenting as a shallow open ulcer with a red, pink wound bed without slough. 1 cm x .25 cm    03/14/19    2019     26   Pressure Injury   -    -               Intake/Output Last 24 hours  Intake/Output Summary (Last 24 hours) at 04/09/2019 2136 Last data filed at 04/09/2019 1914 Gross per 24 hour  Intake 1000 ml  Output -  Net 1000 ml    Labs/Imaging Results for orders placed or performed during the hospital encounter of 04/09/19 (from the past 48 hour(s))  Basic metabolic panel     Status: Abnormal   Collection Time: 04/09/19 12:13 PM  Result Value Ref Range   Sodium 133 (L) 135 - 145 mmol/L   Potassium 3.1 (L) 3.5 - 5.1 mmol/L   Chloride 95 (L) 98 - 111 mmol/L   CO2 21 (L) 22 - 32 mmol/L   Glucose, Bld 143 (H) 70 - 99 mg/dL   BUN 35 (H) 6 - 20 mg/dL   Creatinine, Ser 2.46 (H) 0.61 - 1.24 mg/dL  Calcium 8.6 (L) 8.9 - 10.3 mg/dL   GFR calc non Af Amer 27 (L) >60 mL/min   GFR calc Af Amer 32 (L) >60 mL/min   Anion gap 17 (H) 5 - 15    Comment: Performed at [facility], [address] [place], [place] [address]  CBC     Status: Abnormal   Collection Time: 04/09/19 12:13 PM  Result Value Ref Range   WBC 10.1 4.0 - 10.5 K/uL   RBC 3.35 (L) 4.22 - 5.81 MIL/uL   Hemoglobin 9.5 (L) 13.0 - 17.0 g/dL   HCT 30.2 (L) 39.0 - 52.0 %   MCV 90.1 80.0 - 100.0 fL   MCH 28.4 26.0 - 34.0 pg   MCHC 31.5 30.0 - 36.0 g/dL   RDW 14.1 11.5 - 15.5 %   Platelets 270 150 - 400 K/uL   nRBC 0.0 0.0 - 0.2 %    Comment: Performed at [facility], [address] [place], [place] [address]  Hepatic function panel      Status: Abnormal   Collection Time: 04/09/19 12:13 PM  Result Value Ref Range   Total Protein 7.5 6.5 - 8.1 g/dL   Albumin 3.3 (L) 3.5 - 5.0 g/dL   AST 35 15 - 41 U/L   ALT 23 0 - 44 U/L   Alkaline Phosphatase 109 38 - 126 U/L   Total Bilirubin 0.4 0.3 - 1.2 mg/dL   Bilirubin, Direct 0.1 0.0 - 0.2 mg/dL   Indirect Bilirubin 0.3 0.3 - 0.9 mg/dL    Comment: Performed at [facility], [address] [place], [place] [address]  Urinalysis, Complete w Microscopic     Status: Abnormal   Collection Time: 04/09/19  2:41 PM  Result Value Ref Range   Color, Urine YELLOW (A) YELLOW   APPearance HAZY (A) CLEAR   Specific Gravity, Urine 1.015 1.005 - 1.030   pH 5.0 5.0 - 8.0   Glucose, UA >=500 (A) NEGATIVE mg/dL   Hgb urine dipstick SMALL (A) NEGATIVE   Bilirubin Urine NEGATIVE NEGATIVE   Ketones, ur NEGATIVE NEGATIVE mg/dL   Protein, ur NEGATIVE NEGATIVE mg/dL   Nitrite NEGATIVE NEGATIVE   Leukocytes,Ua TRACE (A) NEGATIVE   RBC / HPF 6-10 0 - 5 RBC/hpf   WBC, UA 6-10 0 - 5 WBC/hpf   Bacteria, UA RARE (A) NONE SEEN   Squamous Epithelial / LPF 0-5 0 - 5   Mucus PRESENT    Hyaline Casts, UA PRESENT     Comment: Performed at [facility], [address] [place], [place] [address]  Lactic acid, plasma     Status: Abnormal   Collection Time: 04/09/19  4:25 PM  Result Value Ref Range   Lactic Acid, Venous 3.9 (HH) 0.5 - 1.9 mmol/L    Comment: CRITICAL RESULT CALLED TO, READ BACK BY AND VERIFIED WITH ARIEL WALLACE AT 1705 04/09/2019.PMF Performed at [facility], [address] [place], [place] [address]   Lactic acid, plasma     Status: Abnormal   Collection Time: 04/09/19  7:36 PM  Result Value Ref Range   Lactic Acid, Venous 2.7 (HH) 0.5 - 1.9 mmol/L    Comment: CRITICAL VALUE NOTED. VALUE IS CONSISTENT WITH PREVIOUSLY REPORTED/CALLED VALUE [facility] Performed at [facility], [address] [place], [place] [address]    Dg Eye Foreign  Body  Result Date: 04/09/2019 CLINICAL DATA:  Metal working/exposure; clearance prior to MRI. 58 year old male with confusion. EXAM: ORBITS  FOR FOREIGN BODY - 2 VIEW COMPARISON:  Head CT earlier tonight. FINDINGS: There is no evidence of metallic foreign body within the orbits. No significant bone abnormality identified. IMPRESSION: No evidence of metallic foreign body within the orbits. No contraindication to MRI on these images. Electronically Signed   By: Genevie Ann M.D.   On: 04/09/2019 19:33   Dg Chest 2 View  Result Date: 04/09/2019 CLINICAL DATA:  Weakness EXAM: CHEST - 2 VIEW COMPARISON:  Chest radiograph dated 03/06/2019 FINDINGS: The heart size and mediastinal contours are within normal limits. Both lungs are clear. Degenerative changes are seen in the spine. IMPRESSION: No active cardiopulmonary disease. Electronically Signed   By: Zerita Boers M.D.   On: 04/09/2019 14:41   Dg Abd 1 View  Result Date: 04/09/2019 CLINICAL DATA:  58 year old male with confusion. Query metallic foreign body, contraindication to MRI. EXAM: ABDOMEN - 1 VIEW COMPARISON:  CT Abdomen and Pelvis 03/09/2019. FINDINGS: Stable bowel gas pattern from last month. As on the prior CT. No radiopaque foreign body identified. Stable visualized osseous structures. Negative visible left lung base. IMPRESSION: No acute finding. No contraindication to MRI on these images. Electronically Signed   By: Genevie Ann M.D.   On: 04/09/2019 19:35   Ct Head Wo Contrast  Result Date: 04/09/2019 CLINICAL DATA:  Multiple falls, concern for intracranial hemorrhage EXAM: CT HEAD WITHOUT CONTRAST TECHNIQUE: Contiguous axial images were obtained from the base of the skull through the vertex without intravenous contrast. COMPARISON:  None. FINDINGS: Brain: No evidence of acute infarction, hemorrhage, hydrocephalus, extra-axial collection or mass lesion/mass effect. Vascular: No hyperdense vessel or unexpected calcification. Skull: Normal. Negative for  fracture or focal lesion. Sinuses/Orbits: No acute finding. Other: None. IMPRESSION: No acute intracranial process. Electronically Signed   By: Zerita Boers M.D.   On: 04/09/2019 14:17   Mr Brain Wo Contrast  Result Date: 04/09/2019 CLINICAL DATA:  58 year old male with new onset left lower extremity weakness and recent falls. Confusion. Developmental delay and cognitive impairment, lives in group home. EXAM: MRI HEAD WITHOUT CONTRAST TECHNIQUE: Multiplanar, multiecho pulse sequences of the brain and surrounding structures were obtained without intravenous contrast. COMPARISON:  Head CT earlier today. FINDINGS: Brain: Stable cerebral volume from the earlier CT. No restricted diffusion to suggest acute infarction. No midline shift, mass effect, evidence of mass lesion, ventriculomegaly, extra-axial collection or acute intracranial hemorrhage. Cervicomedullary junction and pituitary are within normal limits. No cortical encephalomalacia or chronic cerebral blood products identified. Minimal to mild for age nonspecific cerebral white matter T2 and FLAIR hyperintensity. The deep gray nuclei, brainstem and cerebellum appear negative. Vascular: Major intracranial vascular flow voids are preserved. The left vertebral artery appears dominant. Skull and upper cervical spine: Negative visible cervical spine. Normal bone marrow signal. Sinuses/Orbits: Negative. Other: Mastoids are clear. Visible internal auditory structures appear normal. Scalp and face soft tissues appear negative. IMPRESSION: No acute intracranial abnormality and essentially negative for age non-contrast MRI appearance of the brain. Electronically Signed   By: Genevie Ann M.D.   On: 04/09/2019 20:22   Dg Hip Unilat W Or Wo Pelvis 2-3 Views Left  Result Date: 04/09/2019 CLINICAL DATA:  New leg weakness. Fall from standing. EXAM: DG HIP (WITH OR WITHOUT PELVIS) 2-3V LEFT COMPARISON:  CT abdomen pelvis dated 01/08/2017 FINDINGS: There is no evidence of hip  fracture or dislocation. Mild degenerative changes are seen in both hips. Degenerative changes are seen in the spine. IMPRESSION: No evidence of hip fracture or dislocation. Electronically  Signed   By: Zerita Boers M.D.   On: 04/09/2019 14:51    Pending Labs Unresulted Labs (From admission, onward)    Start     Ordered   04/16/19 0500  Creatinine, serum  (enoxaparin (LOVENOX)    CrCl >/= 30 ml/min)  Weekly,   STAT    Comments: while on enoxaparin therapy    04/09/19 1759   04/10/19 0500  Hemoglobin A1c  Tomorrow morning,   STAT     04/09/19 1759   04/10/19 0500  Lipid panel  Tomorrow morning,   STAT    Comments: Fasting    04/09/19 1759   04/10/19 [id number]  Basic metabolic panel  Tomorrow morning,   STAT     04/09/19 1759   04/10/19 0500  CBC  Tomorrow morning,   STAT     04/09/19 1759   04/09/19 1336  SARS CORONAVIRUS 2 (TAT 6-24 HRS) Nasopharyngeal Nasopharyngeal Swab  (Asymptomatic/Tier 3)  Once,   STAT    Question Answer Comment  Is this test for diagnosis or screening Screening   Symptomatic for COVID-19 as defined by CDC No   Hospitalized for COVID-19 No   Admitted to ICU for COVID-19 No   Previously tested for COVID-19 Yes   Resident in a congregate (group) care setting Yes   Employed in healthcare setting No      04/09/19 1335          Vitals/Pain Today's Vitals   04/09/19 1210 04/09/19 1700 04/09/19 1844 04/09/19 2004  BP:  106/81  (!) 146/72  Pulse:    87  Resp:  16  20  Temp:    99 F (37.2 C)  TempSrc:    Oral  SpO2:    100%  Weight: 79.4 kg     Height: 6' (1.829 m)     PainSc:   0-No pain     Isolation Precautions No active isolations  Medications Medications  simvastatin (ZOCOR) tablet 20 mg (20 mg Oral Given 04/09/19 2128)  ARIPiprazole (ABILIFY) tablet 15 mg (has no administration in time range)  mirtazapine (REMERON SOL-TAB) disintegrating tablet 15 mg (has no administration in time range)  QUEtiapine (SEROQUEL) tablet 800 mg (800 mg Oral Given  04/09/19 2129)  traZODone (DESYREL) tablet 50 mg (50 mg Oral Given 04/09/19 2129)  gabapentin (NEURONTIN) tablet 600 mg (600 mg Oral Given 04/09/19 2129)  phenytoin (DILANTIN) ER capsule 300 mg (300 mg Oral Given 04/09/19 2130)  clonazePAM (KLONOPIN) tablet 1 mg (1 mg Oral Given 04/09/19 2127)   stroke: mapping our early stages of recovery book (has no administration in time range)  enoxaparin (LOVENOX) injection 40 mg (40 mg Subcutaneous Given 04/09/19 2131)  insulin aspart (novoLOG) injection 0-15 Units (has no administration in time range)  insulin aspart (novoLOG) injection 0-5 Units (has no administration in time range)  aspirin suppository 300 mg (has no administration in time range)    Or  aspirin tablet 325 mg (has no administration in time range)  0.9 % NaCl with KCl 20 mEq/ L  infusion (has no administration in time range)  acetaminophen (TYLENOL) tablet 650 mg (has no administration in time range)    Or  acetaminophen (TYLENOL) suppository 650 mg (has no administration in time range)  ondansetron (ZOFRAN) tablet 4 mg (has no administration in time range)    Or  ondansetron (ZOFRAN) injection 4 mg (has no administration in time range)  LORazepam (ATIVAN) injection 0.5 mg (has no administration in time range)  phenytoin (DILANTIN) ER capsule 100 mg (has no administration in time range)  sodium chloride 0.9 % bolus 1,000 mL (0 mLs Intravenous Stopped 04/09/19 1914)    Mobility walks with device Moderate fall risk   Focused Assessments Neuro Assessment Handoff:  Swallow screen pass? Yes    NIH Stroke Scale ( + Modified Stroke Scale Criteria)  Interval: Shift assessment Level of Consciousness (1a.)   : Alert, keenly responsive LOC Questions (1b. )   +: Answers both questions correctly LOC Commands (1c. )   + : Performs both tasks correctly Best Gaze (2. )  +: Normal Visual (3. )  +: No visual loss Facial Palsy (4. )    : Normal symmetrical movements Motor Arm, Left (5a. )   +:  No drift Motor Arm, Right (5b. )   +: No drift Motor Leg, Left (6a. )   +: No drift Motor Leg, Right (6b. )   +: No drift Limb Ataxia (7. ): Absent Sensory (8. )   +: Normal, no sensory loss Best Language (9. )   +: No aphasia Dysarthria (10. ): Normal Extinction/Inattention (11.)   +: No Abnormality Modified SS Total  +: 0 Complete NIHSS TOTAL: 0     Neuro Assessment:   Neuro Checks:   Shift assessment (04/09/19 1953)  Last Documented NIHSS Modified Score: 0 (04/09/19 1953) Has TPA been given? No If patient is a Neuro Trauma and patient is going to OR before floor call report to [place] nurse: [phone] or [phone]     R Recommendations: See Admitting Provider Note  Report given to:   Additional Notes: mri - negative stroke

## 2019-04-09 NOTE — ED Notes (Signed)
Pt transported for scans 

## 2019-04-09 NOTE — ED Notes (Signed)
Attempted to call Clara to inform her of admission with no answer

## 2019-04-09 NOTE — H&P (Signed)
History and Physical  Patient Name: Jason Reed     [id number]    DOB: 01/22/1961    DOA: 04/09/2019 PCP: Marguerita Merles, MD  Patient coming from: [place]  Chief Complaint: Left leg weakness      HPI: Jason Reed is a 58 y.o. M with hx of developmental delay, cognitive impairment, lives in group home, psychosis, seizure disorder, HTN, CKD IIIb baseline creatinine 1.6-1.9 and DM who presents with acute left leg weakness.  Patient is judged to be a relatively clear historian but unreliable.  Multiple attempts made to reach caregiver, no answer.  Per ED triage notes, and the patient some point this morning, he developed sudden onset left leg weakness, had difficulty rising from a chair, difficulty ambulating, and fell down twice.  Finally came to the ER.  While in the ER, his leg weakness has started to resolve.  He had no arm weakness, speech disturbance, confusion, loss of consciousness, seizure.  He has had no fever, cough, dyspnea.  He has had no dysuria, urinary irritation out of the ordinary.  In the ER, he was afebrile, heart rate 105, blood pressure 105/70 and pulse ox normal.  CT head unremarkable, hip radiograph chest radiograph unremarkable.  Lactic acid 3.9, creatinine 2.46 (from baseline 1.6-1.9).  Potassium 3.1.  He was given IV fluids and the hospitalist service were asked to evaluate for acute on chronic kidney injury.  Code stroke not called due to the patient having been in the ER greater than 5 hours, plus symptoms already resolving.        ROS: Review of Systems  Constitutional: Negative for fever, malaise/fatigue and weight loss.  Eyes: Negative for blurred vision.  Respiratory: Negative for cough, sputum production, shortness of breath and wheezing.   Cardiovascular: Negative for chest pain, palpitations, orthopnea and leg swelling.  Gastrointestinal: Negative for abdominal pain, blood in stool, constipation, diarrhea, melena, nausea and vomiting.   Genitourinary: Negative for dysuria, flank pain, frequency and urgency.  Musculoskeletal: Positive for falls. Negative for back pain.  Neurological: Positive for focal weakness. Negative for dizziness, tingling, tremors, sensory change, speech change, seizures, loss of consciousness and headaches.  All other systems reviewed and are negative.         Past Medical History:  Diagnosis Date  . Diabetes ([place])   . HTN (hypertension)   . Hyperlipidemia   . Psychosis ([place])   . Seizure [facility])     Past Surgical History:  Procedure Laterality Date  . COLONOSCOPY WITH PROPOFOL N/A 12/22/2018   Procedure: COLONOSCOPY WITH PROPOFOL;  Surgeon: Virgel Manifold, MD;  Location: [facility] ENDOSCOPY;  Service: Endoscopy;  Laterality: N/A;  . COLONOSCOPY WITH PROPOFOL N/A 12/23/2018   Procedure: COLONOSCOPY WITH PROPOFOL;  Surgeon: Virgel Manifold, MD;  Location: [facility] ENDOSCOPY;  Service: Endoscopy;  Laterality: N/A;  . ESOPHAGOGASTRODUODENOSCOPY (EGD) WITH PROPOFOL N/A 12/22/2018   Procedure: ESOPHAGOGASTRODUODENOSCOPY (EGD) WITH PROPOFOL;  Surgeon: Virgel Manifold, MD;  Location: [facility] ENDOSCOPY;  Service: Endoscopy;  Laterality: N/A;    Social History: Patient lives in a group home.  The patient walks with a walker.  Nonsmoker.  Allergies  Allergen Reactions  . Lisinopril     Unknown    Family history: Family history is unknown by patient.  Prior to Admission medications   Medication Sig Start Date End Date Taking? Authorizing Provider  acetaminophen (TYLENOL) 325 MG tablet Take 650 mg by mouth every 6 (six) hours as needed.    [provider]  ARIPiprazole (ABILIFY) 15 MG tablet Take 15 mg by mouth daily.    [provider]  aspirin EC 81 MG tablet Take 81 mg by mouth daily.    [provider]  clonazePAM (KLONOPIN) 1 MG tablet Take 1 mg by mouth at bedtime.     [provider]  gabapentin (NEURONTIN) 600 MG tablet Take 600 mg by mouth 3 (three)  times daily.  10/25/18   [provider]  guaiFENesin-dextromethorphan (ROBITUSSIN DM) 100-10 MG/5ML syrup Take 10 mLs by mouth every 4 (four) hours as needed for cough.    [provider]  hydrochlorothiazide (HYDRODIURIL) 25 MG tablet Take 25 mg by mouth daily.    [provider]  hydrocortisone cream 1 % Apply 1 application topically 2 (two) times daily as needed.     [provider]  loratadine (CLARITIN) 10 MG tablet Take 10 mg by mouth daily.    [provider]  losartan (COZAAR) 100 MG tablet Take 100 mg by mouth daily.    [provider]  metFORMIN (GLUCOPHAGE) 500 MG tablet Take 500 mg by mouth 2 (two) times daily with a meal.    [provider]  metoprolol succinate (TOPROL-XL) 25 MG 24 hr tablet Take 25 mg by mouth daily.    [provider]  mirtazapine (REMERON SOL-TAB) 15 MG disintegrating tablet Take 15 mg by mouth at bedtime.    [provider]  phenytoin (DILANTIN) 100 MG ER capsule Take 100-300 mg by mouth 2 (two) times daily. Take 100 mg by mouth in the morning and 300 mg by mouth at bedtime.    [provider]  psyllium (METAMUCIL) 58.6 % powder Take 1 packet by mouth daily as needed.     [provider]  QUEtiapine (SEROQUEL) 400 MG tablet Take 800 mg by mouth at bedtime.    [provider]  simvastatin (ZOCOR) 20 MG tablet Take 20 mg by mouth at bedtime.    [provider]  traZODone (DESYREL) 50 MG tablet Take 50 mg by mouth at bedtime.    [provider]       Physical Exam: BP 105/70 (BP Location: Left Arm)   Pulse (!) 105   Temp 98.9 F (37.2 C) (Oral)   Resp 16   Ht 6' (1.829 m)   Wt 79.4 kg   SpO2 100%   BMI 23.73 kg/m  General appearance: Overweight adult male, alert and in no acute distress, lying in bed, appears listless and tired, but able to sit up in the edge of the bed with minimal assistance.   Head: Congenital deformity noted  Eyes: Anicteric, conjunctiva pink, lids and lashes normal. PERRL.    ENT: No nasal deformity, discharge, epistaxis.  Hearing normal. OP moist without lesions.  Lips normal, dentition dysmorphic but good repair. Skin: Warm and dry.  No jaundice.  No suspicious rashes or lesions. Cardiac: RRR, nl S1-S2, no murmurs appreciated.  Capillary refill is brisk.  JVP normal.  No LE edema.  Radial pulses 2+ and symmetric. Respiratory: Normal respiratory rate and rhythm.  CTAB without rales or wheezes. Abdomen: Abdomen soft.  Old midline scar.  No TTP or guarding. No ascites, distension, hepatosplenomegaly.   MSK: No deformities or effusions of the large joints of the upper or lower extremities bilaterally.  No cyanosis or clubbing.  Normal muscle bulk and tone. Neuro: Cranial nerves 3 through 12 intact, strength 5 -/5 in upper and lower extremities, seems symmetric to me, no  nystagmus, extraocular movements intact, coordination seems normal.  Sensation intact to light touch. Speech is fluent.    Psych: Sensorium intact and responding to questions, attention normal.  Behavior appropriate.  Affect blunted.  Judgment and insight appear impaired congenitally.     Labs on Admission:  I have personally reviewed following labs and imaging studies: CBC: Recent Labs  Lab 04/09/19 1213  WBC 10.1  HGB 9.5*  HCT 30.2*  MCV 90.1  PLT [id number]   Basic Metabolic Panel: Recent Labs  Lab 04/09/19 1213  NA 133*  K 3.1*  CL 95*  CO2 21*  GLUCOSE 143*  BUN 35*  CREATININE 2.46*  CALCIUM 8.6*   GFR: Estimated Creatinine Clearance: 35 mL/min (A) (by C-G formula based on SCr of 2.46 mg/dL (H)).  Liver Function Tests: Recent Labs  Lab 04/09/19 1213  AST 35  ALT 23  ALKPHOS 109  BILITOT 0.4  PROT 7.5  ALBUMIN 3.3*    Sepsis Labs: Lactic acid 3.9        Radiological Exams on Admission: Personally reviewed chest x-ray shows no focal airspace disease or opacity; CT head report reviewed, no  intracranial process: Dg Chest 2 View  Result Date: 04/09/2019 CLINICAL DATA:  Weakness EXAM: CHEST - 2 VIEW COMPARISON:  Chest radiograph dated 03/06/2019 FINDINGS: The heart size and mediastinal contours are within normal limits. Both lungs are clear. Degenerative changes are seen in the spine. IMPRESSION: No active cardiopulmonary disease. Electronically Signed   By: Zerita Boers M.D.   On: 04/09/2019 14:41   Ct Head Wo Contrast  Result Date: 04/09/2019 CLINICAL DATA:  Multiple falls, concern for intracranial hemorrhage EXAM: CT HEAD WITHOUT CONTRAST TECHNIQUE: Contiguous axial images were obtained from the base of the skull through the vertex without intravenous contrast. COMPARISON:  None. FINDINGS: Brain: No evidence of acute infarction, hemorrhage, hydrocephalus, extra-axial collection or mass lesion/mass effect. Vascular: No hyperdense vessel or unexpected calcification. Skull: Normal. Negative for fracture or focal lesion. Sinuses/Orbits: No acute finding. Other: None. IMPRESSION: No acute intracranial process. Electronically Signed   By: Zerita Boers M.D.   On: 04/09/2019 14:17   Dg Hip Unilat W Or Wo Pelvis 2-3 Views Left  Result Date: 04/09/2019 CLINICAL DATA:  New leg weakness. Fall from standing. EXAM: DG HIP (WITH OR WITHOUT PELVIS) 2-3V LEFT COMPARISON:  CT abdomen pelvis dated 01/08/2017 FINDINGS: There is no evidence of hip fracture or dislocation. Mild degenerative changes are seen in both hips. Degenerative changes are seen in the spine. IMPRESSION: No evidence of hip fracture or dislocation. Electronically Signed   By: Zerita Boers M.D.   On: 04/09/2019 14:51    EKG: Independently reviewed.  ECG shows sinus tachycardia, rate 105, QTc 41, no ST or T wave depressions.  Sinus.       Assessment/Plan   Left leg weakness, acute Patient's chief complaint is acute left leg weakness, concerning for stroke/TIA. -Obtain MRI brain  -Swallow screen -Obtain echo and carotid  imaging -Obtain lipids and hemoglobin A1c -Start aspirin -Monitor on telemetry -tPA not given because symptoms were already resolving, patient was out of the window by the time I had seen him -PT eval ordered -Non-smoker    Elevated lactic acid Unclear cause.  Patient reports poor p.o. intake.  He has no liver disease.  I have no suspicion for sepsis.  Suspect this is from dehydration? -Repeat lactic acid -Continue IV fluids  Acute kidney injury on CKD stage III Baseline creatinine 1.6-1.93.  Presents with creatinine  2.4 -Hold losartan -IV fluids -Trend BMP  Hypokalemia Mild, symptomatic with weakness -Start fluids with potassium  Hypertension Blood pressure somewhat low, will allow permissive hypertension until MRI results back -Hold HCTZ -Hold losartan, metoprolol  Diabetes -Hold home Metformin -Check hemoglobin A1c -Sliding scale correction insulin ordered  Seizure disorder No reported seizure activity -Continue evening clonazepam -Continue gabapentin, Dilantin  Mood disorder No active psychosis -Continue Abilify, mirtazapine, quetiapine, trazodone  Anemia Normocytic, stable relative to baseline, probably anemia of chronic kidney disease.     DVT prophylaxis: Lovenox Code Status: Full code Family Communication: Attempted to reach contact, no answer Disposition Plan: Anticipate MRI brain and monitoring overnight with work-up for TIA. Consults called: None Admission status: Observation   At the point of initial evaluation, it is my clinical opinion that admission for OBSERVATION is reasonable and necessary because the patient's presenting complaints in the context of their chronic conditions represent sufficient risk of deterioration or significant morbidity to constitute reasonable grounds for close observation in the hospital setting, but that the patient may be medically stable for discharge from the hospital within 24 to 48 hours.    Medical decision  making: Patient seen at 5:53 PM on 04/09/2019.  The patient was discussed with Kern Reap, PA-C.  What exists of the patient's chart was reviewed in depth and summarized above.  Clinical condition: Stable.        [facility] Please page though [place] or Epic secure chat:  For password, contact charge nurse

## 2019-04-09 NOTE — ED Notes (Signed)
Patient transported to MRI 

## 2019-04-09 NOTE — ED Notes (Signed)
First Nurse Note: Pt to ED with caregiver who states that pt is having left leg weakness, first noticed yesterday. Pt is in NAD.

## 2019-04-09 NOTE — ED Notes (Signed)
Clara [phone] cell phone pt caregiver

## 2019-04-09 NOTE — ED Provider Notes (Signed)
[facility] Emergency Department Provider Note  ____________________________________________  Time seen: Approximately 1:32 PM  I have reviewed the triage vital signs and the nursing notes.   HISTORY  Chief Complaint Leg Pain (left) and Weakness    HPI Jason Reed is a 58 y.o. male with a history of diabetes, hypertension, hyperlipidemia and seizures presents to the emergency department with new onset inability to stand and ambulate.  Patient is accompanied by caretaker at his group home who states that he normally sits most of the time but is able to stand and walk when prompted.  Caretaker does state that some left lower extremity discomfort is chronic at baseline.  Patient has been producing less urine over the past several days which is concerning to caretaker.  He has not been complaining of dysuria and has not had fever at home.  No new falls or traumas according to caretaker.  Patient is not currently anticoagulated.  No associated rhinorrhea, nasal congestion or nonproductive cough.        Past Medical History:  Diagnosis Date  . Diabetes ([place])   . HTN (hypertension)   . Hyperlipidemia   . Psychosis ([place])   . Seizure [facility])     Patient Active Problem List   Diagnosis Date Noted  . Left leg weakness 04/09/2019  . CKD (chronic kidney disease), stage III 04/09/2019  . Hypokalemia 04/09/2019  . Anemia due to chronic kidney disease 04/09/2019  . Essential hypertension 04/09/2019  . Hyperlipidemia   . Sepsis ([place])   . Acute lower UTI   . 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 AKI (acute kidney injury) ([place])   . Obesity (BMI 30-39.9)   . Psychosis ([place])   . Seizure disorder ([place])   . Rectal polyp   . Ectopic gastric tissue   . Stomach irritation   . Loss of weight   . Colon cancer screening   . Edema of colon   . Septic shock ([place]) 01/08/2017     Past Surgical History:  Procedure Laterality Date  . COLONOSCOPY WITH PROPOFOL N/A 12/22/2018   Procedure: COLONOSCOPY WITH PROPOFOL;  Surgeon: Virgel Manifold, MD;  Location: [facility] ENDOSCOPY;  Service: Endoscopy;  Laterality: N/A;  . COLONOSCOPY WITH PROPOFOL N/A 12/23/2018   Procedure: COLONOSCOPY WITH PROPOFOL;  Surgeon: Virgel Manifold, MD;  Location: [facility] ENDOSCOPY;  Service: Endoscopy;  Laterality: N/A;  . ESOPHAGOGASTRODUODENOSCOPY (EGD) WITH PROPOFOL N/A 12/22/2018   Procedure: ESOPHAGOGASTRODUODENOSCOPY (EGD) WITH PROPOFOL;  Surgeon: Virgel Manifold, MD;  Location: [facility] ENDOSCOPY;  Service: Endoscopy;  Laterality: N/A;    Prior to Admission medications   Medication Sig Start Date End Date Taking? Authorizing Provider  ARIPiprazole (ABILIFY) 15 MG tablet Take 15 mg by mouth daily.   Yes [provider]  aspirin EC 81 MG tablet Take 81 mg by mouth daily.   Yes [provider]  clonazePAM (KLONOPIN) 1 MG tablet Take 1 mg by mouth at bedtime.    Yes [provider]  gabapentin (NEURONTIN) 600 MG tablet Take 600 mg by mouth 3 (three) times daily.  10/25/18  Yes [provider]  hydrochlorothiazide (HYDRODIURIL) 25 MG tablet Take 25 mg by mouth daily.   Yes [provider]  loratadine (CLARITIN) 10 MG tablet Take 10 mg by mouth daily.   Yes [provider]  losartan (COZAAR) 100 MG tablet Take 100 mg by mouth daily.  Yes [provider]  metFORMIN (GLUCOPHAGE) 500 MG tablet Take 500 mg by mouth 2 (two) times daily with a meal.   Yes [provider]  metoprolol succinate (TOPROL-XL) 25 MG 24 hr tablet Take 25 mg by mouth daily.   Yes [provider]  mirtazapine (REMERON) 15 MG tablet Take 15 mg by mouth at bedtime. 03/23/19  Yes [provider]  phenytoin (DILANTIN) 100 MG ER capsule Take 100-300 mg by mouth 2 (two) times daily. Take 100 mg by mouth in the morning and 300 mg by mouth at  bedtime.   Yes [provider]  QUEtiapine (SEROQUEL) 400 MG tablet Take 800 mg by mouth at bedtime.   Yes [provider]  simvastatin (ZOCOR) 20 MG tablet Take 20 mg by mouth at bedtime.   Yes [provider]  traZODone (DESYREL) 50 MG tablet Take 50 mg by mouth at bedtime.   Yes [provider]  acetaminophen (TYLENOL) 325 MG tablet Take 650 mg by mouth every 6 (six) hours as needed.    [provider]  guaiFENesin-dextromethorphan (ROBITUSSIN DM) 100-10 MG/5ML syrup Take 10 mLs by mouth every 4 (four) hours as needed for cough.    [provider]  hydrocortisone cream 1 % Apply 1 application topically 2 (two) times daily as needed.     [provider]  psyllium (METAMUCIL) 58.6 % powder Take 1 packet by mouth daily as needed.     [provider]    Allergies Lisinopril  Family History  Family history unknown: Yes    Social History Social History   Tobacco Use  . Smoking status: Never Smoker  . Smokeless tobacco: Never Used  Substance Use Topics  . Alcohol use: No  . Drug use: Not Currently     Review of Systems  Constitutional: No fever/chills Eyes: No visual changes. No discharge ENT: No upper respiratory complaints. Cardiovascular: no chest pain. Respiratory: no cough. No SOB. Gastrointestinal: No abdominal pain.  No nausea, no vomiting.  No diarrhea.  No constipation. Genitourinary: Patient has decreased urine output.  Musculoskeletal: Patient has left leg pain/weakness.  Skin: Negative for rash, abrasions, lacerations, ecchymosis. Neurological: Negative for headaches, focal weakness or numbness.   ____________________________________________   PHYSICAL EXAM:  VITAL SIGNS: ED Triage Vitals  Enc Vitals Group     BP 04/09/19 1209 105/70     Pulse Rate 04/09/19 1209 (!) 105     Resp 04/09/19 1209 16     Temp 04/09/19 1209 98.9 F (37.2 C)     Temp Source 04/09/19 1209 Oral     SpO2  04/09/19 1209 100 %     Weight 04/09/19 1210 175 lb (79.4 kg)     Height 04/09/19 1210 6' (1.829 m)     Head Circumference --      Peak Flow --      Pain Score --      Pain Loc --      Pain Edu? --      Excl. in [place]? --      Constitutional: Alert and oriented. Well appearing and in no acute distress. Eyes: Conjunctivae are normal. PERRL. EOMI. Head: Atraumatic. ENT:      Nose: No congestion/rhinnorhea.      Mouth/Throat: Mucous membranes are moist.  Neck: No stridor.  No cervical spine tenderness to palpation. Cardiovascular: Normal rate, regular rhythm. Normal S1 and S2.  Good peripheral circulation. Respiratory: Normal respiratory effort without tachypnea or retractions. Lungs CTAB.  Good air entry to the bases with no decreased or absent breath sounds. Gastrointestinal: Bowel sounds 4 quadrants. Soft and nontender to palpation. No guarding or rigidity. No palpable masses. No distention. No CVA tenderness. Musculoskeletal: Patient has symmetric grip strength bilaterally.  Symmetric strength in the lower extremities.  He has no tenderness elicited with internal and external rotation of the left hip.  He can perform full range of motion at the left knee.  Patient cannot stand due to discomfort.  Palpable dorsalis pedis pulse bilaterally and symmetrically. Neurologic: Patient can follow basic commands.  He can perform rapid alternating movements and has no hypo or hyperreflexia. Skin:  Skin is warm, dry and intact. No rash noted. Psychiatric: Mood and affect are normal. Speech and behavior are normal. Patient exhibits appropriate insight and judgement.   ____________________________________________   LABS (all labs ordered are listed, but only abnormal results are displayed)  Labs Reviewed  BASIC METABOLIC PANEL - Abnormal; Notable for the following components:      Result Value   Sodium 133 (*)    Potassium 3.1 (*)    Chloride 95 (*)    CO2 21 (*)    Glucose, Bld 143 (*)     BUN 35 (*)    Creatinine, Ser 2.46 (*)    Calcium 8.6 (*)    GFR calc non Af Amer 27 (*)    GFR calc Af Amer 32 (*)    Anion gap 17 (*)    All other components within normal limits  CBC - Abnormal; Notable for the following components:   RBC 3.35 (*)    Hemoglobin 9.5 (*)    HCT 30.2 (*)    All other components within normal limits  URINALYSIS, COMPLETE (UACMP) WITH MICROSCOPIC - Abnormal; Notable for the following components:   Color, Urine YELLOW (*)    APPearance HAZY (*)    Glucose, UA >=500 (*)    Hgb urine dipstick SMALL (*)    Leukocytes,Ua TRACE (*)    Bacteria, UA RARE (*)    All other components within normal limits  HEPATIC FUNCTION PANEL - Abnormal; Notable for the following components:   Albumin 3.3 (*)    All other components within normal limits  LACTIC ACID, PLASMA - Abnormal; Notable for the following components:   Lactic Acid, Venous 3.9 (*)    All other components within normal limits  LACTIC ACID, PLASMA - Abnormal; Notable for the following components:   Lactic Acid, Venous 2.7 (*)    All other components within normal limits  SARS CORONAVIRUS 2 (TAT 6-24 HRS)  HEMOGLOBIN A1C  LIPID PANEL  BASIC METABOLIC PANEL  CBC   ____________________________________________  EKG   ____________________________________________  RADIOLOGY I personally viewed and evaluated these images as part of my medical decision making, as well as reviewing the written report by the radiologist.    Dg Eye Foreign Body  Result Date: 04/09/2019 CLINICAL DATA:  Metal working/exposure; clearance prior to MRI. 58 year old male with confusion. EXAM: ORBITS FOR FOREIGN BODY - 2 VIEW COMPARISON:  Head CT earlier tonight. FINDINGS: There is no evidence of metallic foreign body within the orbits. No significant bone abnormality identified. IMPRESSION: No evidence of metallic foreign body within the orbits. No contraindication to MRI on these images. Electronically Signed   By: Genevie Ann  M.D.   On: 04/09/2019 19:33   Dg Chest 2 View  Result Date: 04/09/2019 CLINICAL DATA:  Weakness EXAM: CHEST - 2 VIEW COMPARISON:  Chest radiograph  dated 03/06/2019 FINDINGS: The heart size and mediastinal contours are within normal limits. Both lungs are clear. Degenerative changes are seen in the spine. IMPRESSION: No active cardiopulmonary disease. Electronically Signed   By: Zerita Boers M.D.   On: 04/09/2019 14:41   Dg Abd 1 View  Result Date: 04/09/2019 CLINICAL DATA:  58 year old male with confusion. Query metallic foreign body, contraindication to MRI. EXAM: ABDOMEN - 1 VIEW COMPARISON:  CT Abdomen and Pelvis 03/09/2019. FINDINGS: Stable bowel gas pattern from last month. As on the prior CT. No radiopaque foreign body identified. Stable visualized osseous structures. Negative visible left lung base. IMPRESSION: No acute finding. No contraindication to MRI on these images. Electronically Signed   By: Genevie Ann M.D.   On: 04/09/2019 19:35   Ct Head Wo Contrast  Result Date: 04/09/2019 CLINICAL DATA:  Multiple falls, concern for intracranial hemorrhage EXAM: CT HEAD WITHOUT CONTRAST TECHNIQUE: Contiguous axial images were obtained from the base of the skull through the vertex without intravenous contrast. COMPARISON:  None. FINDINGS: Brain: No evidence of acute infarction, hemorrhage, hydrocephalus, extra-axial collection or mass lesion/mass effect. Vascular: No hyperdense vessel or unexpected calcification. Skull: Normal. Negative for fracture or focal lesion. Sinuses/Orbits: No acute finding. Other: None. IMPRESSION: No acute intracranial process. Electronically Signed   By: Zerita Boers M.D.   On: 04/09/2019 14:17   Mr Brain Wo Contrast  Result Date: 04/09/2019 CLINICAL DATA:  58 year old male with new onset left lower extremity weakness and recent falls. Confusion. Developmental delay and cognitive impairment, lives in group home. EXAM: MRI HEAD WITHOUT CONTRAST TECHNIQUE: Multiplanar,  multiecho pulse sequences of the brain and surrounding structures were obtained without intravenous contrast. COMPARISON:  Head CT earlier today. FINDINGS: Brain: Stable cerebral volume from the earlier CT. No restricted diffusion to suggest acute infarction. No midline shift, mass effect, evidence of mass lesion, ventriculomegaly, extra-axial collection or acute intracranial hemorrhage. Cervicomedullary junction and pituitary are within normal limits. No cortical encephalomalacia or chronic cerebral blood products identified. Minimal to mild for age nonspecific cerebral white matter T2 and FLAIR hyperintensity. The deep gray nuclei, brainstem and cerebellum appear negative. Vascular: Major intracranial vascular flow voids are preserved. The left vertebral artery appears dominant. Skull and upper cervical spine: Negative visible cervical spine. Normal bone marrow signal. Sinuses/Orbits: Negative. Other: Mastoids are clear. Visible internal auditory structures appear normal. Scalp and face soft tissues appear negative. IMPRESSION: No acute intracranial abnormality and essentially negative for age non-contrast MRI appearance of the brain. Electronically Signed   By: Genevie Ann M.D.   On: 04/09/2019 20:22   Dg Hip Unilat W Or Wo Pelvis 2-3 Views Left  Result Date: 04/09/2019 CLINICAL DATA:  New leg weakness. Fall from standing. EXAM: DG HIP (WITH OR WITHOUT PELVIS) 2-3V LEFT COMPARISON:  CT abdomen pelvis dated 01/08/2017 FINDINGS: There is no evidence of hip fracture or dislocation. Mild degenerative changes are seen in both hips. Degenerative changes are seen in the spine. IMPRESSION: No evidence of hip fracture or dislocation. Electronically Signed   By: Zerita Boers M.D.   On: 04/09/2019 14:51    ____________________________________________    PROCEDURES  Procedure(s) performed:    Procedures    Medications  simvastatin (ZOCOR) tablet 20 mg (has no administration in time range)  ARIPiprazole  (ABILIFY) tablet 15 mg (has no administration in time range)  mirtazapine (REMERON SOL-TAB) disintegrating tablet 15 mg (has no administration in time range)  QUEtiapine (SEROQUEL) tablet 800 mg (has no administration in time range)  traZODone (DESYREL) tablet 50 mg (has no administration in time range)  gabapentin (NEURONTIN) tablet 600 mg (has no administration in time range)  phenytoin (DILANTIN) ER capsule 300 mg (has no administration in time range)  clonazePAM (KLONOPIN) tablet 1 mg (has no administration in time range)   stroke: mapping our early stages of recovery book (has no administration in time range)  enoxaparin (LOVENOX) injection 40 mg (has no administration in time range)  insulin aspart (novoLOG) injection 0-15 Units (has no administration in time range)  insulin aspart (novoLOG) injection 0-5 Units (has no administration in time range)  aspirin suppository 300 mg (has no administration in time range)    Or  aspirin tablet 325 mg (has no administration in time range)  0.9 % NaCl with KCl 20 mEq/ L  infusion (has no administration in time range)  acetaminophen (TYLENOL) tablet 650 mg (has no administration in time range)    Or  acetaminophen (TYLENOL) suppository 650 mg (has no administration in time range)  ondansetron (ZOFRAN) tablet 4 mg (has no administration in time range)    Or  ondansetron (ZOFRAN) injection 4 mg (has no administration in time range)  LORazepam (ATIVAN) injection 0.5 mg (has no administration in time range)  phenytoin (DILANTIN) ER capsule 100 mg (has no administration in time range)  sodium chloride 0.9 % bolus 1,000 mL (0 mLs Intravenous Stopped 04/09/19 1914)     ____________________________________________   INITIAL IMPRESSION / ASSESSMENT AND PLAN / ED COURSE  Pertinent labs & imaging results that were available during my care of the patient were reviewed by me and considered in my medical decision making (see chart for details).  Review  of the [place] CSRS was performed in accordance of the [place] prior to dispensing any controlled drugs.  Clinical Course as of Apr 08 2054  Nancy Fetter Apr 09, 2019  1357 Lactic acid, plasma [JW]    Clinical Course User Index [JW] Lannie Fields, PA-C        Assessment and Plan:  Weakness  Acute on Chronic Renal Failure  58 year old male presents to the emergency department with concern for perceived left leg weakness and an inability to stand in the absence of trauma.  Patient was mildly tachycardic at triage and afebrile with a normotensive blood pressure.  On physical exam, patient had no significant tenderness with internal and external rotation at the left hip and could bear weight.  He had symmetric grip strength bilaterally and sensation was intact.  He was able to perform full range of motion of the left knee.  He was able to perform rapid alternating movements and had no hypo or hyperreflexia.  Differential diagnosis includes acute on chronic renal failure, sepsis, UTI...  We will obtain lactate, hepatic function panel, CT head and x-ray of the left hip and will reassess.  CT head revealed no evidence of intracranial bleed.  No fractures were identified on x-ray examination of the left hip.  Hepatic function panel was within reference range.  Lactic acid was elevated at 3.9 and creatinine elevated at 2.46 which has trended up from 1.63 from three weeks ago.   We will admit to medicine for acute on chronic renal failure.   ____________________________________________  FINAL CLINICAL IMPRESSION(S) / ED DIAGNOSES  Final diagnoses:  TIA (transient ischemic attack)      NEW MEDICATIONS STARTED DURING THIS VISIT:  ED Discharge Orders    None          This chart was dictated  using voice recognition software/Dragon. Despite best efforts to proofread, errors can occur which can change the meaning. Any change was purely unintentional.    Lannie Fields, PA-C 04/09/19 2100     Blake Divine, MD 04/10/19 [phone]

## 2019-04-09 NOTE — ED Notes (Signed)
Pt taken to xray 

## 2019-04-09 NOTE — ED Notes (Signed)
Called lab and asked them to get repeat lactic

## 2019-04-09 NOTE — ED Notes (Signed)
This RN could not find vein for IV and [place] RN attempted IV x2 with no success- MD aware of delay with fluids- Val, RN to room to look for IV

## 2019-04-10 ENCOUNTER — Observation Stay: Payer: Medicare Other

## 2019-04-10 DIAGNOSIS — E872 Acidosis, unspecified: Secondary | ICD-10-CM | POA: Diagnosis present

## 2019-04-10 DIAGNOSIS — I129 Hypertensive chronic kidney disease with stage 1 through stage 4 chronic kidney disease, or unspecified chronic kidney disease: Secondary | ICD-10-CM | POA: Diagnosis present

## 2019-04-10 DIAGNOSIS — G8314 Monoplegia of lower limb affecting left nondominant side: Secondary | ICD-10-CM | POA: Diagnosis present

## 2019-04-10 DIAGNOSIS — N1832 Chronic kidney disease, stage 3b: Secondary | ICD-10-CM | POA: Diagnosis present

## 2019-04-10 DIAGNOSIS — R7989 Other specified abnormal findings of blood chemistry: Secondary | ICD-10-CM

## 2019-04-10 DIAGNOSIS — E785 Hyperlipidemia, unspecified: Secondary | ICD-10-CM | POA: Diagnosis present

## 2019-04-10 DIAGNOSIS — Z888 Allergy status to other drugs, medicaments and biological substances status: Secondary | ICD-10-CM | POA: Diagnosis not present

## 2019-04-10 DIAGNOSIS — Z79891 Long term (current) use of opiate analgesic: Secondary | ICD-10-CM | POA: Diagnosis not present

## 2019-04-10 DIAGNOSIS — E0822 Diabetes mellitus due to underlying condition with diabetic chronic kidney disease: Secondary | ICD-10-CM | POA: Diagnosis not present

## 2019-04-10 DIAGNOSIS — R4189 Other symptoms and signs involving cognitive functions and awareness: Secondary | ICD-10-CM | POA: Diagnosis present

## 2019-04-10 DIAGNOSIS — F39 Unspecified mood [affective] disorder: Secondary | ICD-10-CM | POA: Diagnosis present

## 2019-04-10 DIAGNOSIS — N179 Acute kidney failure, unspecified: Secondary | ICD-10-CM | POA: Diagnosis present

## 2019-04-10 DIAGNOSIS — E876 Hypokalemia: Secondary | ICD-10-CM | POA: Diagnosis present

## 2019-04-10 DIAGNOSIS — D631 Anemia in chronic kidney disease: Secondary | ICD-10-CM | POA: Diagnosis present

## 2019-04-10 DIAGNOSIS — Z7982 Long term (current) use of aspirin: Secondary | ICD-10-CM | POA: Diagnosis not present

## 2019-04-10 DIAGNOSIS — W19XXXA Unspecified fall, initial encounter: Secondary | ICD-10-CM | POA: Diagnosis present

## 2019-04-10 DIAGNOSIS — Z20828 Contact with and (suspected) exposure to other viral communicable diseases: Secondary | ICD-10-CM | POA: Diagnosis present

## 2019-04-10 DIAGNOSIS — I10 Essential (primary) hypertension: Secondary | ICD-10-CM | POA: Diagnosis not present

## 2019-04-10 DIAGNOSIS — R6250 Unspecified lack of expected normal physiological development in childhood: Secondary | ICD-10-CM | POA: Diagnosis present

## 2019-04-10 DIAGNOSIS — Z79899 Other long term (current) drug therapy: Secondary | ICD-10-CM | POA: Diagnosis not present

## 2019-04-10 DIAGNOSIS — E1122 Type 2 diabetes mellitus with diabetic chronic kidney disease: Secondary | ICD-10-CM | POA: Diagnosis present

## 2019-04-10 DIAGNOSIS — R29898 Other symptoms and signs involving the musculoskeletal system: Secondary | ICD-10-CM | POA: Diagnosis not present

## 2019-04-10 DIAGNOSIS — G40909 Epilepsy, unspecified, not intractable, without status epilepticus: Secondary | ICD-10-CM | POA: Diagnosis present

## 2019-04-10 DIAGNOSIS — E860 Dehydration: Secondary | ICD-10-CM | POA: Diagnosis present

## 2019-04-10 DIAGNOSIS — Z23 Encounter for immunization: Secondary | ICD-10-CM | POA: Diagnosis present

## 2019-04-10 DIAGNOSIS — Z7984 Long term (current) use of oral hypoglycemic drugs: Secondary | ICD-10-CM | POA: Diagnosis not present

## 2019-04-10 LAB — CBC
HCT: 28.5 % — ABNORMAL LOW (ref 39.0–52.0)
Hemoglobin: 8.9 g/dL — ABNORMAL LOW (ref 13.0–17.0)
MCH: 28.4 pg (ref 26.0–34.0)
MCHC: 31.2 g/dL (ref 30.0–36.0)
MCV: 91.1 fL (ref 80.0–100.0)
Platelets: 256 10*3/uL (ref 150–400)
RBC: 3.13 MIL/uL — ABNORMAL LOW (ref 4.22–5.81)
RDW: 14.3 % (ref 11.5–15.5)
WBC: 5.5 10*3/uL (ref 4.0–10.5)
nRBC: 0 % (ref 0.0–0.2)

## 2019-04-10 LAB — BASIC METABOLIC PANEL
Anion gap: 9 (ref 5–15)
BUN: 28 mg/dL — ABNORMAL HIGH (ref 6–20)
CO2: 27 mmol/L (ref 22–32)
Calcium: 8.3 mg/dL — ABNORMAL LOW (ref 8.9–10.3)
Chloride: 102 mmol/L (ref 98–111)
Creatinine, Ser: 1.71 mg/dL — ABNORMAL HIGH (ref 0.61–1.24)
GFR calc Af Amer: 49 mL/min — ABNORMAL LOW (ref >60)
GFR calc non Af Amer: 43 mL/min — ABNORMAL LOW (ref >60)
Glucose, Bld: 112 mg/dL — ABNORMAL HIGH (ref 70–99)
Potassium: 3.1 mmol/L — ABNORMAL LOW (ref 3.5–5.1)
Sodium: 138 mmol/L (ref 135–145)

## 2019-04-10 LAB — GLUCOSE, CAPILLARY
Glucose-Capillary: 103 mg/dL — ABNORMAL HIGH (ref 70–99)
Glucose-Capillary: 155 mg/dL — ABNORMAL HIGH (ref 70–99)
Glucose-Capillary: 134 mg/dL — ABNORMAL HIGH (ref 70–99)
Glucose-Capillary: 129 mg/dL — ABNORMAL HIGH (ref 70–99)

## 2019-04-10 LAB — LIPID PANEL
Cholesterol: 146 mg/dL (ref 0–200)
HDL: 49 mg/dL (ref >40)
LDL Cholesterol: 69 mg/dL (ref 0–99)
Total CHOL/HDL Ratio: 3 RATIO
Triglycerides: 138 mg/dL (ref <150)
VLDL: 28 mg/dL (ref 0–40)

## 2019-04-10 LAB — HEMOGLOBIN A1C
Hgb A1c MFr Bld: 5.8 % — ABNORMAL HIGH (ref 4.8–5.6)
Mean Plasma Glucose: 119.76 mg/dL

## 2019-04-10 LAB — LACTIC ACID, PLASMA: Lactic Acid, Venous: 1.7 mmol/L (ref 0.5–1.9)

## 2019-04-10 LAB — MAGNESIUM: Magnesium: 1.9 mg/dL (ref 1.7–2.4)

## 2019-04-10 MED ORDER — INFLUENZA VAC SPLIT QUAD 0.5 ML IM SUSY
0.5000 mL | PREFILLED_SYRINGE | INTRAMUSCULAR | Status: AC
  Administered 2019-04-11: 09:00:00 0.5 mL via INTRAMUSCULAR
  Filled 2019-04-10: qty 0.5

## 2019-04-10 MED ORDER — POTASSIUM CHLORIDE CRYS ER 20 MEQ PO TBCR
40.0000 meq | EXTENDED_RELEASE_TABLET | Freq: Three times a day (TID) | ORAL | Status: AC
  Administered 2019-04-10 (×2): 40 meq via ORAL
  Filled 2019-04-10 (×2): qty 2

## 2019-04-10 MED ORDER — PNEUMOCOCCAL VAC POLYVALENT 25 MCG/0.5ML IJ INJ
0.5000 mL | INJECTION | INTRAMUSCULAR | Status: AC
  Administered 2019-04-11: 0.5 mL via INTRAMUSCULAR
  Filled 2019-04-10: qty 0.5

## 2019-04-10 NOTE — TOC Initial Note (Signed)
Transition of Care [facility]) - Initial/Assessment Note    Patient Details  Name: Jason Reed MRN: [id number] Date of Birth: 07-May-1960  Transition of Care [facility]) CM/SW Contact:    Shelbie Hutching, RN Phone Number: 04/10/2019, 12:12 PM  Clinical Narrative:                 [facility] from [facility].  Patient was recently discharged from [facility] center.  Patient is independent in ADL's at the group home and requires no assistive devices.  Plan will be for patient to discharge back to group home with home health services through [facility].  Sharmon Revere with [facility] has accepted referral for home health.  Clara Vicente Males is the group home Administrator, she will provide transportation for patient when ready for discharge.    Expected Discharge Plan: Group Home(with home health) Barriers to Discharge: Continued Medical Work up   Patient Goals and CMS Choice   CMS Medicare.gov Compare Post Acute Care list provided to:: Patient Represenative (must comment) Choice offered to / list presented to : NA(Clara - group home adminisistrator)  Expected Discharge Plan and Services Expected Discharge Plan: Group Home(with home health)   Discharge Planning Services: CM Consult Post Acute Care Choice: [place] arrangements for the past 2 months: Group Home                           HH Arranged: PT, OT HH Agency: [place] Date [facility] Agency Contacted: 04/10/19 Time HH Agency Contacted: 1211 Representative spoke with at [place]: Sharmon Revere  Prior Living Arrangements/Services Living arrangements for the past 2 months: [place] with:: Facility Resident Patient language and need for interpreter reviewed:: Yes Do you feel safe going back to the place where you live?: Yes      Need for Family Participation in Patient Care: Yes (Comment) Care giver support system in place?: Yes (comment)([facility])   Criminal  Activity/Legal Involvement Pertinent to Current Situation/Hospitalization: No - Comment as needed  Activities of Daily Living Home Assistive Devices/Equipment: Walker (specify type) ADL Screening (condition at time of admission) Patient's cognitive ability adequate to safely complete daily activities?: No Is the patient deaf or have difficulty hearing?: No Does the patient have difficulty seeing, even when wearing glasses/contacts?: No Does the patient have difficulty concentrating, remembering, or making decisions?: Yes Patient able to express need for assistance with ADLs?: Yes Does the patient have difficulty dressing or bathing?: Yes Independently performs ADLs?: No Does the patient have difficulty walking or climbing stairs?: Yes Weakness of Legs: Both Weakness of Arms/Hands: None  Permission Sought/Granted Permission sought to share information with : Case Manager, Customer service manager, Other (comment) Permission granted to share information with : Yes, Verbal Permission Granted     Permission granted to share info w AGENCY: [facility]        Emotional Assessment Appearance:: Appears stated age     Orientation: : Oriented to Self, Oriented to Place Alcohol / Substance Use: Not Applicable Psych Involvement: No (comment)  Admission diagnosis:  TIA (transient ischemic attack) [G45.9] MRI contraindicated due to metal implant [Z53.09] Patient Active Problem List   Diagnosis Date Noted  . Left leg weakness 04/09/2019  . CKD (chronic kidney disease), stage III 04/09/2019  . Hypokalemia 04/09/2019  . Anemia due to chronic kidney disease 04/09/2019  . Essential hypertension 04/09/2019  . Hyperlipidemia   . Sepsis (  [place])   . Acute lower UTI   . 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 AKI (acute kidney injury) ([place])   . Obesity (BMI 30-39.9)   . Psychosis ([place])   . Seizure  disorder ([place])   . Rectal polyp   . Ectopic gastric tissue   . Stomach irritation   . Loss of weight   . Colon cancer screening   . Edema of colon   . Septic shock ([place]) 01/08/2017   PCP:  Marguerita Merles, MD Pharmacy:   [facility] [address] [place] [address] Phone: [phone] Fax: [phone]     Social Determinants of Health (SDOH) Interventions    Readmission Risk Interventions No flowsheet data found.

## 2019-04-10 NOTE — Evaluation (Signed)
Physical Therapy Evaluation Patient Details Name: Jason Reed MRN: [id number] DOB: 05/15/1960 Today's Date: 04/10/2019   History of Present Illness  58yo male admitted for LLE weakness. Stroke work up negative for acute infarct. PMHx includes DM, seizure disorder, psychosis, developmental delay; from group home.  Clinical Impression  Pt in bed upon arrival and agreeable to PT. Pt reports living in a group home with about 5 steps to enter with left handrail and typically ambulating with rollator. Pt required min A to get EOB and perform STS transfer. Pt required min guard for ambulation with cuing for maintaining more upright posture. B LE strength grossly 4-/5. Pt reports feeling better today but still having a little more difficulty with bed mobility and transfers than normal. Pt presents with decreased strength, balance, power and endurance limiting functional mobility. Pt would benefit from skilled acute PT to improve noted deficits and home health PT following discharge to further improve deficits and decrease fall risk. Pt educated on recommendation and in agreement.     Follow Up Recommendations Home health PT    Equipment Recommendations  None recommended by PT    Recommendations for Other Services       Precautions / Restrictions Precautions Precautions: Fall Restrictions Weight Bearing Restrictions: No      Mobility  Bed Mobility Overal bed mobility: Needs Assistance Bed Mobility: Supine to Sit     Supine to sit: Min assist Sit to supine: Min guard   General bed mobility comments: min A for trunk elevation, increased time and effort to get EOB, cuing for sequencing/technique  Transfers Overall transfer level: Needs assistance Equipment used: Rolling walker (2 wheeled) Transfers: Sit to/from Stand Sit to Stand: From elevated surface;Min assist        Lateral/Scoot Transfers: Min guard;From elevated surface General transfer comment: min A to rise from slightly  elevated bed, cuing for hand placement, stand to sit to recliner pt required cuing for keeping walker with him and to turn all the way around before sitting with pt leaving walker early and sitting before fully turning around  Ambulation/Gait Ambulation/Gait assistance: Min guard Gait Distance (Feet): 75 Feet Assistive device: Rolling walker (2 wheeled) Gait Pattern/deviations: Decreased stride length;Step-through pattern;Trunk flexed Gait velocity: decreased   General Gait Details: pt ambulated in hall with RW with increased trunk flexion and very short stride length, pt required cuing for more upright posture and keeping walker closer, no overt LOB noted, min guard for safety  Stairs            Wheelchair Mobility    Modified Rankin (Stroke Patients Only)       Balance Overall balance assessment: Needs assistance Sitting-balance support: No upper extremity supported;Feet supported Sitting balance-Leahy Scale: Fair     Standing balance support: Bilateral upper extremity supported;During functional activity Standing balance-Leahy Scale: Fair Standing balance comment: reliance on RW for ambulation                             Pertinent Vitals/Pain Pain Assessment: No/denies pain    Home Living Family/patient expects to be discharged to:: Group home                      Prior Function Level of Independence: Needs assistance   Gait / Transfers Assistance Needed: pt reports ambulating with rollator  ADL's / Homemaking Assistance Needed: pt reports ind with ADLs, per chart needs assist for IADLs  Comments: at  least 2 falls recently 2/2 LLE weakness     Hand Dominance   Dominant Hand: Left    Extremity/Trunk Assessment   Upper Extremity Assessment Upper Extremity Assessment: Generalized weakness    Lower Extremity Assessment Lower Extremity Assessment: Generalized weakness(B LE strength grossly 4-/5)       Communication   Communication:  Expressive difficulties  Cognition Arousal/Alertness: Awake/alert Behavior During Therapy: Flat affect Overall Cognitive Status: History of cognitive impairments - at baseline                                 General Comments: hx developmental delay, follows commands with cues for sequencing, oriented to self and place in general (hospital) and situation in general (my leg was weak)      General Comments      Exercises Other Exercises Other Exercises: ADL transfer training   Assessment/Plan    PT Assessment Patient needs continued PT services  PT Problem List Decreased strength;Decreased mobility;Decreased safety awareness;Decreased activity tolerance;Decreased balance;Decreased knowledge of use of DME;Decreased cognition;Decreased range of motion       PT Treatment Interventions DME instruction;Therapeutic exercise;Gait training;Stair training;Balance training;Therapeutic activities;Patient/family education;Functional mobility training    PT Goals (Current goals can be found in the Care Plan section)  Acute Rehab PT Goals Patient Stated Goal: go home PT Goal Formulation: With patient Time For Goal Achievement: 04/24/19 Potential to Achieve Goals: Good    Frequency Min 2X/week   Barriers to discharge        Co-evaluation               AM-PAC PT "6 Clicks" Mobility  Outcome Measure Help needed turning from your back to your side while in a flat bed without using bedrails?: A Little Help needed moving from lying on your back to sitting on the side of a flat bed without using bedrails?: A Little Help needed moving to and from a bed to a chair (including a wheelchair)?: A Little Help needed standing up from a chair using your arms (e.g., wheelchair or bedside chair)?: A Little Help needed to walk in hospital room?: A Little Help needed climbing 3-5 steps with a railing? : A Lot 6 Click Score: 17    End of Session Equipment Utilized During Treatment:  Gait belt Activity Tolerance: Patient tolerated treatment well Patient left: in chair;with call bell/phone within reach;with chair alarm set Nurse Communication: Mobility status PT Visit Diagnosis: Muscle weakness (generalized) (M62.81);Other abnormalities of gait and mobility (R26.89)    Time: [id number] PT Time Calculation (min) (ACUTE ONLY): 19 min   Charges:   PT Evaluation $PT Eval Low Complexity: 1 Low          Nayeliz Hipp PT, DPT 12:14 PM,04/10/19 [phone]   Mashanda Ishibashi Drucilla Chalet 04/10/2019, 12:10 PM

## 2019-04-10 NOTE — Progress Notes (Signed)
OT Cancellation Note  Patient Details Name: Jason Reed MRN: [id number] DOB: 1960/07/28   Cancelled Treatment:    Reason Eval/Treat Not Completed: Patient at procedure or test/ unavailable. Order received, chart reviewed. Pt out of room for testing. Will re-attempt OT evaluation at later date/time as pt is available and medically appropriate.  Jeni Salles, MPH, MS, OTR/L ascom [phone] 04/10/19, 8:09 AM

## 2019-04-10 NOTE — Progress Notes (Signed)
PROGRESS NOTE    Jason Reed  [id number] DOB: May 31, 1960 DOA: 04/09/2019 PCP: Marguerita Merles, MD      Brief Narrative:  Jason Reed is a 58 y.o. M with hx of developmental delay, cognitive impairment, lives in group home, psychosis, seizure disorder, HTN, CKD IIIb baseline creatinine 1.6-1.9 and DM who presented with acute left leg weakness.  Per ED triage notes, and the patient some point this morning, he developed sudden onset left leg weakness, had difficulty rising from a chair, difficulty ambulating, and fell down twice.  Finally came to the ER.    In the ER, he was afebrile, heart rate 105, blood pressure 105/70 and pulse ox normal.  CT head unremarkable, hip radiograph chest radiograph unremarkable.  Lactic acid 3.9, creatinine 2.46 (from baseline 1.6-1.9).  Potassium 3.1.  He was given IV fluids and the hospitalist service were asked to evaluate for acute on chronic kidney injury.      Assessment & Plan:  Left leg weakness, acute --> more likely generalized weakness Patient presented with acute leg weakness.  He reports this still today, despite normal MRI last night, which rules out TIA.  The patient is a vague historian, and objectively has bilateral leg weakness rather than unilateral, so I suspect he has NOT had an MRI-negative stroke.  Carotids normal, LDL <70, A1c shows good control.  -Continue aspirin and statin -Monitor on telemetry    Elevated lactic acid Unclear cause.  Patient reports poor p.o. intake.  He has no liver disease.  Possibly dehydration.  Possibly over-medication with antihypertensives.    I have low suspicion for sepsis (given no fever, WBC normal and stayed there today, UA with only few bacteria, no WBCs, and normal CXR) but note recent admission for GNR bacteremia, possible stone, with only symptoms weakness and tachycardia and leukocytosis.  Lactate cleared overnight -Obtain blood and urine culture -Continue IV fluids  Acute  kidney injury on CKD stage III Baseline creatinine 1.6-1.93.  Presents with creatinine 2.4 --> improved to 1.7 overnight -Continue IV fluids  -Trend BMP  Hypokalemia Mild, symptomatic with weakness -SUpplement K again -Check mag  Hypertension BP soft -Hold HCTZ, losartan, metoprolol  Diabetes Glucoses well controlled -Hold home Metformin -Continue SS correction insulin  Seizure disorder No reported seizure activity -Continue evening clonazepam -Continue gabapentin, Dilantin  Mood disorder No active psychosis -Continue Abilify, mirtazapine, quetiapine, trazodone  Anemia Normocytic, stable relative to baseline, probably anemia of chronic kidney disease.  Trend down likely dilutional.         MDM and disposition: The below labs and imaging reports were reviewed and summarized above.  Medication management as above.  The patient was admitted with leg weakness.  MRI negative, doubt stroke.    In light of his recent admission for bacteremia from UTI, the possibility of a collecting duct stone on his CT during that admission (nidus for infection) and his unexplained lactic acidosis and continued soft blood pressure now, I feel continued inpatient services are reasonable and necessary to continue IV fluids, and obtain blood and urine cultures.         DVT prophylaxis: Lovenox Code Status: FULL Family Communication:     Consultants:     Procedures:   12.6 CT head -- unremarkable  12/6 MRI brain -- unremarkable  12/6 abdomen -- no stones  12/7 US carotids -- negative  Antimicrobials:   None    Subjective: Feeling stronger.  No fever.  No vomiting, confusion.  No cough.  No  dysuria.  No abdominal pain.  Still feels left leg is weak.  Objective: Vitals:   04/10/19 0053 04/10/19 0229 04/10/19 0557 04/10/19 0733  BP:  114/68 100/66 130/75  Pulse:  82 84 76  Resp:  16 18 17   Temp: 97.6 F (36.4 C)  98.3 F (36.8 C) 98.9 F (37.2 C)   TempSrc: Oral  Oral Oral  SpO2:  100% 100% 100%  Weight:      Height:        Intake/Output Summary (Last 24 hours) at 04/10/2019 1204 Last data filed at 04/10/2019 1021 Gross per 24 hour  Intake 1342.66 ml  Output --  Net 1342.66 ml   Filed Weights   04/09/19 1210 04/10/19 0007  Weight: 79.4 kg 108.1 kg    Examination: General appearance: obese adult male, alert and in no acute distress.  Sitting up in bed HEENT: Anicteric, conjunctiva pink, lids and lashes normal.  Exotropia noted.  No nasal deformity, discharge, epistaxis.  Lips moist, dentition poor, oropharynx tacky dry, no oral lesions, hearing normal.  Congenital deformity facies. Skin: Warm and dry.    No suspicious rashes or lesions. Cardiac: RRR, nl S1-S2, no murmurs appreciated.  Capillary refill is brisk.  JVPnot visible.  No LE edema.  Radial pulses 2+ and symmetric. Respiratory: Normal respiratory rate and rhythm.  CTAB without rales or wheezes. Abdomen: Abdomen soft.  No TTP or guarding. No ascites, distension, hepatosplenomegaly.   MSK: No deformities or effusions. Neuro: Awake and alert.  EOMI, moves all extremities with generalized weakness, right hand is contractured, chronically. Speech fluent.    Psych: Sensorium intact and responding to questions, attention normal. Affect blunted.  Judgment and insight appear congenitally impaired.    Data Reviewed: I have personally reviewed following labs and imaging studies:  CBC: Recent Labs  Lab 04/09/19 1213 04/10/19 0648  WBC 10.1 5.5  HGB 9.5* 8.9*  HCT 30.2* 28.5*  MCV 90.1 91.1  PLT 270 [id number]   Basic Metabolic Panel: Recent Labs  Lab 04/09/19 1213 04/10/19 0648  NA 133* 138  K 3.1* 3.1*  CL 95* 102  CO2 21* 27  GLUCOSE 143* 112*  BUN 35* 28*  CREATININE 2.46* 1.71*  CALCIUM 8.6* 8.3*   GFR: Estimated Creatinine Clearance: 58.3 mL/min (A) (by C-G formula based on SCr of 1.71 mg/dL (H)). Liver Function Tests: Recent Labs  Lab 04/09/19 1213   AST 35  ALT 23  ALKPHOS 109  BILITOT 0.4  PROT 7.5  ALBUMIN 3.3*   No results for input(s): LIPASE, AMYLASE in the last 168 hours. No results for input(s): AMMONIA in the last 168 hours. Coagulation Profile: No results for input(s): INR, PROTIME in the last 168 hours. Cardiac Enzymes: No results for input(s): CKTOTAL, CKMB, CKMBINDEX, TROPONINI in the last 168 hours. BNP (last 3 results) No results for input(s): PROBNP in the last 8760 hours. HbA1C: Recent Labs    04/10/19 0648  HGBA1C 5.8*   CBG: Recent Labs  Lab 04/09/19 2223 04/10/19 0738  GLUCAP 119* 103*   Lipid Profile: Recent Labs    04/10/19 0648  CHOL 146  HDL 49  LDLCALC 69  TRIG 138  CHOLHDL 3.0   Thyroid Function Tests: No results for input(s): TSH, T4TOTAL, FREET4, T3FREE, THYROIDAB in the last 72 hours. Anemia Panel: No results for input(s): VITAMINB12, FOLATE, FERRITIN, TIBC, IRON, RETICCTPCT in the last 72 hours. Urine analysis:    Component Value Date/Time   COLORURINE YELLOW (A) 04/09/2019 1441   APPEARANCEUR  HAZY (A) 04/09/2019 1441   LABSPEC 1.015 04/09/2019 1441   PHURINE 5.0 04/09/2019 1441   GLUCOSEU >=500 (A) 04/09/2019 1441   HGBUR SMALL (A) 04/09/2019 1441   BILIRUBINUR NEGATIVE 04/09/2019 1441   KETONESUR NEGATIVE 04/09/2019 1441   PROTEINUR NEGATIVE 04/09/2019 1441   NITRITE NEGATIVE 04/09/2019 1441   LEUKOCYTESUR TRACE (A) 04/09/2019 1441   Sepsis Labs: @LABRCNTIP (procalcitonin:4,lacticacidven:4)  ) Recent Results (from the past 240 hour(s))  SARS CORONAVIRUS 2 (TAT 6-24 HRS) Nasopharyngeal Nasopharyngeal Swab     Status: None   Collection Time: 04/09/19  2:41 PM   Specimen: Nasopharyngeal Swab  Result Value Ref Range Status   SARS Coronavirus 2 NEGATIVE NEGATIVE Final    Comment: (NOTE) SARS-CoV-2 target nucleic acids are NOT DETECTED. The SARS-CoV-2 RNA is generally detectable in upper and lower respiratory specimens during the acute phase of infection.  Negative results do not preclude SARS-CoV-2 infection, do not rule out co-infections with other pathogens, and should not be used as the sole basis for treatment or other patient management decisions. Negative results must be combined with clinical observations, patient history, and epidemiological information. The expected result is Negative. Fact Sheet for Patients: SugarRoll.be Fact Sheet for Healthcare Providers: https://www.woods-mathews.com/ This test is not yet approved or cleared by the [place] FDA and  has been authorized for detection and/or diagnosis of SARS-CoV-2 by FDA under an Emergency Use Authorization (EUA). This EUA will remain  in effect (meaning this test can be used) for the duration of the COVID-19 declaration under Section 56 4(b)(1) of the Act, 21 U.S.C. section 360bbb-3(b)(1), unless the authorization is terminated or revoked sooner. Performed at [facility], [place] [address], [place], [place] [address]          Radiology Studies: Dg Eye Foreign Body  Result Date: 04/09/2019 CLINICAL DATA:  Metal working/exposure; clearance prior to MRI. 58 year old male with confusion. EXAM: ORBITS FOR FOREIGN BODY - 2 VIEW COMPARISON:  Head CT earlier tonight. FINDINGS: There is no evidence of metallic foreign body within the orbits. No significant bone abnormality identified. IMPRESSION: No evidence of metallic foreign body within the orbits. No contraindication to MRI on these images. Electronically Signed   By: Genevie Ann M.D.   On: 04/09/2019 19:33   Dg Chest 2 View  Result Date: 04/09/2019 CLINICAL DATA:  Weakness EXAM: CHEST - 2 VIEW COMPARISON:  Chest radiograph dated 03/06/2019 FINDINGS: The heart size and mediastinal contours are within normal limits. Both lungs are clear. Degenerative changes are seen in the spine. IMPRESSION: No active cardiopulmonary disease. Electronically Signed   By: Zerita Boers M.D.   On:  04/09/2019 14:41   Dg Abd 1 View  Result Date: 04/09/2019 CLINICAL DATA:  58 year old male with confusion. Query metallic foreign body, contraindication to MRI. EXAM: ABDOMEN - 1 VIEW COMPARISON:  CT Abdomen and Pelvis 03/09/2019. FINDINGS: Stable bowel gas pattern from last month. As on the prior CT. No radiopaque foreign body identified. Stable visualized osseous structures. Negative visible left lung base. IMPRESSION: No acute finding. No contraindication to MRI on these images. Electronically Signed   By: Genevie Ann M.D.   On: 04/09/2019 19:35   Ct Head Wo Contrast  Result Date: 04/09/2019 CLINICAL DATA:  Multiple falls, concern for intracranial hemorrhage EXAM: CT HEAD WITHOUT CONTRAST TECHNIQUE: Contiguous axial images were obtained from the base of the skull through the vertex without intravenous contrast. COMPARISON:  None. FINDINGS: Brain: No evidence of acute infarction, hemorrhage, hydrocephalus, extra-axial collection or mass  lesion/mass effect. Vascular: No hyperdense vessel or unexpected calcification. Skull: Normal. Negative for fracture or focal lesion. Sinuses/Orbits: No acute finding. Other: None. IMPRESSION: No acute intracranial process. Electronically Signed   By: Zerita Boers M.D.   On: 04/09/2019 14:17   Mr Brain Wo Contrast  Result Date: 04/09/2019 CLINICAL DATA:  58 year old male with new onset left lower extremity weakness and recent falls. Confusion. Developmental delay and cognitive impairment, lives in group home. EXAM: MRI HEAD WITHOUT CONTRAST TECHNIQUE: Multiplanar, multiecho pulse sequences of the brain and surrounding structures were obtained without intravenous contrast. COMPARISON:  Head CT earlier today. FINDINGS: Brain: Stable cerebral volume from the earlier CT. No restricted diffusion to suggest acute infarction. No midline shift, mass effect, evidence of mass lesion, ventriculomegaly, extra-axial collection or acute intracranial hemorrhage. Cervicomedullary junction  and pituitary are within normal limits. No cortical encephalomalacia or chronic cerebral blood products identified. Minimal to mild for age nonspecific cerebral white matter T2 and FLAIR hyperintensity. The deep gray nuclei, brainstem and cerebellum appear negative. Vascular: Major intracranial vascular flow voids are preserved. The left vertebral artery appears dominant. Skull and upper cervical spine: Negative visible cervical spine. Normal bone marrow signal. Sinuses/Orbits: Negative. Other: Mastoids are clear. Visible internal auditory structures appear normal. Scalp and face soft tissues appear negative. IMPRESSION: No acute intracranial abnormality and essentially negative for age non-contrast MRI appearance of the brain. Electronically Signed   By: Genevie Ann M.D.   On: 04/09/2019 20:22   US Carotid Bilateral (at [facility] Only)  Result Date: 04/10/2019 CLINICAL DATA:  TIA symptoms EXAM: BILATERAL CAROTID DUPLEX ULTRASOUND TECHNIQUE: Pearline Cables scale imaging, color Doppler and duplex ultrasound were performed of bilateral carotid and vertebral arteries in the neck. COMPARISON:  None. FINDINGS: Criteria: Quantification of carotid stenosis is based on velocity parameters that correlate the residual internal carotid diameter with NASCET-based stenosis levels, using the diameter of the distal internal carotid lumen as the denominator for stenosis measurement. The following velocity measurements were obtained: RIGHT ICA: 57/14 cm/sec CCA: 0000000 cm/sec SYSTOLIC ICA/CCA RATIO:  0.6 ECA: 79 cm/sec LEFT ICA: 102/41 cm/sec CCA: 0000000 cm/sec SYSTOLIC ICA/CCA RATIO:  1.2 ECA: 61 cm/sec RIGHT CAROTID ARTERY: Minor echogenic shadowing plaque formation. No hemodynamically significant right ICA stenosis, velocity elevation, or turbulent flow. Degree of narrowing less than 50%. RIGHT VERTEBRAL ARTERY:  Antegrade LEFT CAROTID ARTERY: Similar scattered minor echogenic plaque formation. No hemodynamically significant left ICA  stenosis, velocity elevation, or turbulent flow. LEFT VERTEBRAL ARTERY:  Antegrade IMPRESSION: Minor carotid atherosclerosis. No hemodynamically significant ICA stenosis. Degree of narrowing less than 50% bilaterally by ultrasound criteria. Patent antegrade vertebral flow bilaterally Electronically Signed   By: Jerilynn Mages.  Shick M.D.   On: 04/10/2019 08:51   Dg Hip Unilat W Or Wo Pelvis 2-3 Views Left  Result Date: 04/09/2019 CLINICAL DATA:  New leg weakness. Fall from standing. EXAM: DG HIP (WITH OR WITHOUT PELVIS) 2-3V LEFT COMPARISON:  CT abdomen pelvis dated 01/08/2017 FINDINGS: There is no evidence of hip fracture or dislocation. Mild degenerative changes are seen in both hips. Degenerative changes are seen in the spine. IMPRESSION: No evidence of hip fracture or dislocation. Electronically Signed   By: Zerita Boers M.D.   On: 04/09/2019 14:51        Scheduled Meds:  ARIPiprazole  15 mg Oral Daily   aspirin  325 mg Oral Daily   clonazePAM  1 mg Oral QHS   enoxaparin (LOVENOX) injection  40 mg Subcutaneous Q24H   gabapentin  600 mg Oral TID   [START ON 04/11/2019] influenza vac split quadrivalent PF  0.5 mL Intramuscular Tomorrow-1000   insulin aspart  0-15 Units Subcutaneous TID WC   insulin aspart  0-5 Units Subcutaneous QHS   mirtazapine  15 mg Oral QHS   phenytoin  100 mg Oral q morning - 10a   phenytoin  300 mg Oral QHS   [START ON 04/11/2019] pneumococcal 23 valent vaccine  0.5 mL Intramuscular Tomorrow-1000   potassium chloride  40 mEq Oral TID   QUEtiapine  800 mg Oral QHS   simvastatin  20 mg Oral QHS   traZODone  50 mg Oral QHS   Continuous Infusions:  0.9 % NaCl with KCl 20 mEq / L 100 mL/hr at 04/10/19 0543     LOS: 0 days    Time spent: 25 minutes    Edwin Dada, MD [facility] 04/10/2019, 12:04 PM     Please page though Shea Evans or Epic secure chat:  For [employer], Adult nurse

## 2019-04-10 NOTE — Progress Notes (Signed)
Chart reviewed, Pt visited. Stroke workup negative for acute infarct. Pt visited and screened. Pt reports no new speech deficits and no swallowing problems. Pt was able to communicate needs and wants, noted some dysfluency which Pt reports is not new. Pt resides in a group home and has developmental delay as a diagnosis. No ST eval or tx indicated at this time unless determined that these deficits are new. If Pt presents with any dysphagia, please reconsult ST for swallowing eval.

## 2019-04-10 NOTE — Evaluation (Signed)
Occupational Therapy Evaluation Patient Details Name: Jason Reed MRN: [id number] DOB: 12-29-1960 Today's Date: 04/10/2019    History of Present Illness 58yo male admitted for LLE weakness. Stroke work up negative for acute infarct. PMHx includes DM, seizure disorder, psychosis, developmental delay; from group home.   Clinical Impression   Pt seen for OT evaluation this date. Per chart and pt (questionable historian; no caregivers present), prior to hospital admission, pt was indep with basic ADL, requiring set up assist for self feeding, and ambulating with 2WW household distances at group home. Pt reports assist for IADL.  Currently pt demonstrates impairments in strength globally, hx cognitive deficits requiring moderate multi-modal cues for sequencing, and balance requiring Min A for functional ADL transfers, LB dressing/bathing, and CGA once in standing for lateral side step transfers with RW. Pt would benefit from skilled OT to address noted impairments and functional limitations (see below for any additional details) in order to maximize safety and independence while minimizing falls risk and caregiver burden.  Upon hospital discharge, recommend pt discharge back to group home with [facility] services and 24/7 supervision/assist.    Follow Up Recommendations  Home health OT;Supervision/Assistance - 24 hour    Equipment Recommendations  3 in 1 bedside commode    Recommendations for Other Services       Precautions / Restrictions Precautions Precautions: Fall Restrictions Weight Bearing Restrictions: No      Mobility Bed Mobility Overal bed mobility: Needs Assistance Bed Mobility: Supine to Sit;Sit to Supine     Supine to sit: Min guard;HOB elevated Sit to supine: Min guard   General bed mobility comments: additional time/effort to perform with cues for adjusting shoulders and feet back in bed to improve positioning  Transfers Overall transfer level: Needs  assistance Equipment used: Rolling walker (2 wheeled) Transfers: Sit to/from Stand;Lateral/Scoot Transfers Sit to Stand: From elevated surface;Min assist        Lateral/Scoot Transfers: Min guard;From elevated surface General transfer comment: max cues for sequencing during lateral side steps with RW    Balance Overall balance assessment: Needs assistance Sitting-balance support: No upper extremity supported;Feet supported Sitting balance-Leahy Scale: Fair     Standing balance support: Bilateral upper extremity supported Standing balance-Leahy Scale: Fair Standing balance comment: heavy BUE reliance on RW during side steps but able to let go with BUE to doff soiled briefs                           ADL either performed or assessed with clinical judgement   ADL Overall ADL's : Needs assistance/impaired Eating/Feeding: Set up;Sitting   Grooming: Sitting;Set up   Upper Body Bathing: Sitting;Set up;Supervision/ safety   Lower Body Bathing: Sit to/from stand;Minimal assistance Lower Body Bathing Details (indicate cue type and reason): in standing able to initiate doffing of soiled brief requiring Min A to complete doffing off feet in sitting for safety Upper Body Dressing : Sitting;Set up;Supervision/safety   Lower Body Dressing: Sit to/from stand;Minimal assistance   Toilet Transfer: Min guard;BSC;RW;Ambulation;Cueing for sequencing                   Vision Baseline Vision/History: No visual deficits Patient Visual Report: No change from baseline Vision Assessment?: No apparent visual deficits     Perception     Praxis      Pertinent Vitals/Pain Pain Assessment: No/denies pain     Hand Dominance Left   Extremity/Trunk Assessment Upper Extremity Assessment Upper Extremity Assessment: Generalized  weakness   Lower Extremity Assessment Lower Extremity Assessment: Generalized weakness       Communication Communication Communication: Expressive  difficulties   Cognition Arousal/Alertness: Awake/alert Behavior During Therapy: Flat affect Overall Cognitive Status: History of cognitive impairments - at baseline                                 General Comments: hx developmental delay, follows commands with cues for sequencing, oriented to self and place in general (hospital) and situation in general (my leg was weak)   General Comments       Exercises Other Exercises Other Exercises: ADL transfer training   Shoulder Instructions      Home Living Family/patient expects to be discharged to:: Group home                                        Prior Functioning/Environment Level of Independence: Needs assistance  Gait / Transfers Assistance Needed: Per chart/pt, ambulates household distances with 2WW ADL's / Homemaking Assistance Needed: Per chart/pt, indep with basic ADL; set up for meals (cutting food, opening containers), assist for meds, meal prep, and other IADLs   Comments: at least 2 falls recently 2/2 LLE weakness        OT Problem List: Decreased strength;Impaired balance (sitting and/or standing);Decreased knowledge of use of DME or AE      OT Treatment/Interventions: Self-care/ADL training;Therapeutic exercise;Therapeutic activities;DME and/or AE instruction;Patient/family education;Balance training    OT Goals(Current goals can be found in the care plan section) Acute Rehab OT Goals Patient Stated Goal: get better and go home OT Goal Formulation: With patient Time For Goal Achievement: 04/24/19 Potential to Achieve Goals: Good ADL Goals Pt Will Perform Lower Body Dressing: with supervision;sit to/from stand Pt Will Transfer to Toilet: ambulating;bedside commode;with supervision(LRAD for amb)  OT Frequency: Min 1X/week   Barriers to D/C:            Co-evaluation              AM-PAC OT "6 Clicks" Daily Activity     Outcome Measure Help from another person eating  meals?: A Little Help from another person taking care of personal grooming?: A Little Help from another person toileting, which includes using toliet, bedpan, or urinal?: A Little Help from another person bathing (including washing, rinsing, drying)?: A Little Help from another person to put on and taking off regular upper body clothing?: A Little Help from another person to put on and taking off regular lower body clothing?: A Little 6 Click Score: 18   End of Session Equipment Utilized During Treatment: Gait belt;Rolling walker Nurse Communication: Other (comment);Mobility status(cleaned and soiled brief removed, new chucks placed under him)  Activity Tolerance: Patient tolerated treatment well Patient left: in bed;with call bell/phone within reach;with bed alarm set  OT Visit Diagnosis: Other abnormalities of gait and mobility (R26.89);Muscle weakness (generalized) (M62.81);History of falling (Z91.81)                Time: [id number] OT Time Calculation (min): 26 min Charges:  OT General Charges $OT Visit: 1 Visit OT Evaluation $OT Eval Moderate Complexity: 1 Mod OT Treatments $Therapeutic Activity: 8-22 mins  Jeni Salles, MPH, MS, OTR/L ascom [phone] 04/10/19, 9:53 AM

## 2019-04-11 DIAGNOSIS — E872 Acidosis: Secondary | ICD-10-CM

## 2019-04-11 LAB — CBC
HCT: 28.1 % — ABNORMAL LOW (ref 39.0–52.0)
Hemoglobin: 9.3 g/dL — ABNORMAL LOW (ref 13.0–17.0)
MCH: 29.2 pg (ref 26.0–34.0)
MCHC: 33.1 g/dL (ref 30.0–36.0)
MCV: 88.1 fL (ref 80.0–100.0)
Platelets: 267 10*3/uL (ref 150–400)
RBC: 3.19 MIL/uL — ABNORMAL LOW (ref 4.22–5.81)
RDW: 14.3 % (ref 11.5–15.5)
WBC: 5.1 10*3/uL (ref 4.0–10.5)
nRBC: 0 % (ref 0.0–0.2)

## 2019-04-11 LAB — BASIC METABOLIC PANEL
Anion gap: 10 (ref 5–15)
BUN: 20 mg/dL (ref 6–20)
CO2: 25 mmol/L (ref 22–32)
Calcium: 8.3 mg/dL — ABNORMAL LOW (ref 8.9–10.3)
Chloride: 103 mmol/L (ref 98–111)
Creatinine, Ser: 1.27 mg/dL — ABNORMAL HIGH (ref 0.61–1.24)
GFR calc Af Amer: >60 mL/min (ref >60)
GFR calc non Af Amer: >60 mL/min (ref >60)
Glucose, Bld: 166 mg/dL — ABNORMAL HIGH (ref 70–99)
Potassium: 3.4 mmol/L — ABNORMAL LOW (ref 3.5–5.1)
Sodium: 138 mmol/L (ref 135–145)

## 2019-04-11 LAB — GLUCOSE, CAPILLARY
Glucose-Capillary: 117 mg/dL — ABNORMAL HIGH (ref 70–99)
Glucose-Capillary: 154 mg/dL — ABNORMAL HIGH (ref 70–99)

## 2019-04-11 MED ORDER — POTASSIUM CHLORIDE CRYS ER 20 MEQ PO TBCR
20.0000 meq | EXTENDED_RELEASE_TABLET | Freq: Two times a day (BID) | ORAL | Status: DC
  Administered 2019-04-11: 09:00:00 20 meq via ORAL
  Filled 2019-04-11: qty 1

## 2019-04-11 MED ORDER — POTASSIUM CHLORIDE CRYS ER 20 MEQ PO TBCR: 20.0000 meq | EXTENDED_RELEASE_TABLET | Freq: Every day | ORAL | 0 refills | Status: DC

## 2019-04-11 NOTE — Discharge Summary (Signed)
Physician Discharge Summary  Jason Reed [id number] DOB: 1960-06-03 DOA: 04/09/2019  PCP: Marguerita Merles, MD  Admit date: 04/09/2019 Discharge date: 04/11/2019  Admitted From: Group Home  Disposition:  Group Home   Recommendations for Outpatient Follow-up:  1. Follow up with PCP in 1-2 weeks 2. Please obtain BMP in one week to recheck Cr and K     Home Health: PT/OT, patient has cognitive impairment that makes it unsafe to leave home alone  Equipment/Devices: 3-in-1  Discharge Condition: Good  CODE STATUS: FULL Diet recommendation: Diabetic  Brief/Interim Summary: [name] a 58 y.o.Mwith hx of developmental delay, cognitive impairment, lives in group home, psychosis, seizure disorder, HTN, CKDIIIbbaseline creatinine 1.6-1.9 and DMwho presented with acute left leg weakness.  Per ED triage notes, and the patient some point this morning, he developed sudden onset left leg weakness, had difficulty rising from a chair, difficulty ambulating, and fell down twice. Finally came to the ER.   In the ER, he was afebrile, heart rate 105, blood pressure 105/70 and pulse ox normal. CT head unremarkable, hip radiograph chest radiograph unremarkable. Lactic acid 3.9, creatinine 2.46 (from baseline 1.6-1.9). Potassium 3.1. He was given IV fluids and the hospitalist service were asked to evaluate for acute on chronic kidney injury.     PRINCIPAL HOSPITAL DIAGNOSIS: Dehydration    Discharge Diagnoses:   Left leg weakness --> more likely generalized weakness Patient presented with complaints of acute leg weakness.  Per his report, this was his left leg, but on exam, it was bilateral.    CT head and MRI brain showed no acute intracranial process.  The patient is a vague historian, and objectively has bilateral leg weakness rather than unilateral, so I suspect he has NOT had an MRI-negative stroke nor a TIA.  Carotids normal, LDL <70, A1c shows good control.  He is on a  good stroke prevention regimen of BP control, aspirin and statin.  Recommend weight loss.    Elevated lactic acid Unclear cause. Patient reports poor p.o. intake. He has no liver disease nor hypoxia to explain elevated lactic acid. He had no fever or WBC.  CXR clear, UA without signs of infection.  Blood cultures were obtained given his recent E coli bacteremia, and these were negative.  He was given IV fluids and his creatinine improved dramatically, so I suspect this was largely dehydration, possibly over-medication with antihypertensives.    Acute kidney injury on CKD stage III Baseline creatinine 1.6-1.93. Presented with creatinine 2.4 --> improved to 1.2 with fluids. -Check BMP in 1 week  Hypokalemia Mild, repleted.  Hypertension BP low throughout stay. -Hold HCTZ, losartan, metoprolol -Follow up with PCP in 1 week  Diabetes Well controlled  Seizure disorder No seizure activity noted here.  Mood disorder No active psychosis  Anemia Normocytic, stable relative to baseline, probably anemia of chronic kidney disease.  Trend down likely dilutional.          Discharge Instructions  Discharge Instructions    Discharge instructions   Complete by: As directed    You were admitted for dehydration  You were given fluids and your dehydration got better  You should resume your home medicines EXCEPT: Stop losartan, HCTZ and metoprolol Check your blood pressure twice weekly and restart these medicines if your blood pressure is OVER 140/90 on two occasions  Follow up with primary care doctor in 1 week  You should take potassium 20 mEq once daily for the next 3 days  Drink AT LEAST  6 eight ounce glasses of water per day!  Go see your primary care doctor in 1 week and have them check your kidney function and potassium   Increase activity slowly   Complete by: As directed      Allergies as of 04/11/2019      Reactions   Lisinopril    Unknown       Medication List    STOP taking these medications   hydrochlorothiazide 25 MG tablet Commonly known as: HYDRODIURIL   losartan 100 MG tablet Commonly known as: COZAAR   metoprolol succinate 25 MG 24 hr tablet Commonly known as: TOPROL-XL     TAKE these medications   acetaminophen 325 MG tablet Commonly known as: TYLENOL Take 650 mg by mouth every 6 (six) hours as needed.   ARIPiprazole 15 MG tablet Commonly known as: ABILIFY Take 15 mg by mouth daily.   aspirin EC 81 MG tablet Take 81 mg by mouth daily.   clonazePAM 1 MG tablet Commonly known as: KLONOPIN Take 1 mg by mouth at bedtime.   Dilantin 100 MG ER capsule Generic drug: phenytoin Take 100-300 mg by mouth 2 (two) times daily. Take 100 mg by mouth in the morning and 300 mg by mouth at bedtime.   gabapentin 600 MG tablet Commonly known as: NEURONTIN Take 600 mg by mouth 3 (three) times daily.   guaiFENesin-dextromethorphan 100-10 MG/5ML syrup Commonly known as: ROBITUSSIN DM Take 10 mLs by mouth every 4 (four) hours as needed for cough.   hydrocortisone cream 1 % Apply 1 application topically 2 (two) times daily as needed.   loratadine 10 MG tablet Commonly known as: CLARITIN Take 10 mg by mouth daily.   metFORMIN 500 MG tablet Commonly known as: GLUCOPHAGE Take 500 mg by mouth 2 (two) times daily with a meal.   mirtazapine 15 MG tablet Commonly known as: REMERON Take 15 mg by mouth at bedtime.   potassium chloride SA 20 MEQ tablet Commonly known as: KLOR-CON Take 1 tablet (20 mEq total) by mouth daily.   psyllium 58.6 % powder Commonly known as: METAMUCIL Take 1 packet by mouth daily as needed.   QUEtiapine 400 MG tablet Commonly known as: SEROQUEL Take 800 mg by mouth at bedtime.   simvastatin 20 MG tablet Commonly known as: ZOCOR Take 20 mg by mouth at bedtime.   traZODone 50 MG tablet Commonly known as: DESYREL Take 50 mg by mouth at bedtime.            Durable Medical  Equipment  (From admission, onward)         Start     Ordered   04/11/19 1148  DME 3-in-1  Once     04/11/19 1148         Follow-up Information    Marguerita Merles, MD. Schedule an appointment as soon as possible for a visit in 1 week(s).   Specialty: Family Medicine Contact information: [place] [address] [phone]          Allergies  Allergen Reactions  . Lisinopril     Unknown    Consultations:     Procedures/Studies: Dg Eye Foreign Body  Result Date: 04/09/2019 CLINICAL DATA:  Metal working/exposure; clearance prior to MRI. 57 year old male with confusion. EXAM: ORBITS FOR FOREIGN BODY - 2 VIEW COMPARISON:  Head CT earlier tonight. FINDINGS: There is no evidence of metallic foreign body within the orbits. No significant bone abnormality identified. IMPRESSION: No evidence of metallic  foreign body within the orbits. No contraindication to MRI on these images. Electronically Signed   By: Genevie Ann M.D.   On: 04/09/2019 19:33   Dg Chest 2 View  Result Date: 04/09/2019 CLINICAL DATA:  Weakness EXAM: CHEST - 2 VIEW COMPARISON:  Chest radiograph dated 03/06/2019 FINDINGS: The heart size and mediastinal contours are within normal limits. Both lungs are clear. Degenerative changes are seen in the spine. IMPRESSION: No active cardiopulmonary disease. Electronically Signed   By: Zerita Boers M.D.   On: 04/09/2019 14:41   Dg Abd 1 View  Result Date: 04/09/2019 CLINICAL DATA:  58 year old male with confusion. Query metallic foreign body, contraindication to MRI. EXAM: ABDOMEN - 1 VIEW COMPARISON:  CT Abdomen and Pelvis 03/09/2019. FINDINGS: Stable bowel gas pattern from last month. As on the prior CT. No radiopaque foreign body identified. Stable visualized osseous structures. Negative visible left lung base. IMPRESSION: No acute finding. No contraindication to MRI on these images. Electronically Signed   By: Genevie Ann M.D.   On: 04/09/2019 19:35   Ct Head  Wo Contrast  Result Date: 04/09/2019 CLINICAL DATA:  Multiple falls, concern for intracranial hemorrhage EXAM: CT HEAD WITHOUT CONTRAST TECHNIQUE: Contiguous axial images were obtained from the base of the skull through the vertex without intravenous contrast. COMPARISON:  None. FINDINGS: Brain: No evidence of acute infarction, hemorrhage, hydrocephalus, extra-axial collection or mass lesion/mass effect. Vascular: No hyperdense vessel or unexpected calcification. Skull: Normal. Negative for fracture or focal lesion. Sinuses/Orbits: No acute finding. Other: None. IMPRESSION: No acute intracranial process. Electronically Signed   By: Zerita Boers M.D.   On: 04/09/2019 14:17   Mr Brain Wo Contrast  Result Date: 04/09/2019 CLINICAL DATA:  58 year old male with new onset left lower extremity weakness and recent falls. Confusion. Developmental delay and cognitive impairment, lives in group home. EXAM: MRI HEAD WITHOUT CONTRAST TECHNIQUE: Multiplanar, multiecho pulse sequences of the brain and surrounding structures were obtained without intravenous contrast. COMPARISON:  Head CT earlier today. FINDINGS: Brain: Stable cerebral volume from the earlier CT. No restricted diffusion to suggest acute infarction. No midline shift, mass effect, evidence of mass lesion, ventriculomegaly, extra-axial collection or acute intracranial hemorrhage. Cervicomedullary junction and pituitary are within normal limits. No cortical encephalomalacia or chronic cerebral blood products identified. Minimal to mild for age nonspecific cerebral white matter T2 and FLAIR hyperintensity. The deep gray nuclei, brainstem and cerebellum appear negative. Vascular: Major intracranial vascular flow voids are preserved. The left vertebral artery appears dominant. Skull and upper cervical spine: Negative visible cervical spine. Normal bone marrow signal. Sinuses/Orbits: Negative. Other: Mastoids are clear. Visible internal auditory structures appear  normal. Scalp and face soft tissues appear negative. IMPRESSION: No acute intracranial abnormality and essentially negative for age non-contrast MRI appearance of the brain. Electronically Signed   By: Genevie Ann M.D.   On: 04/09/2019 20:22   US Carotid Bilateral (at [facility] Only)  Result Date: 04/10/2019 CLINICAL DATA:  TIA symptoms EXAM: BILATERAL CAROTID DUPLEX ULTRASOUND TECHNIQUE: Pearline Cables scale imaging, color Doppler and duplex ultrasound were performed of bilateral carotid and vertebral arteries in the neck. COMPARISON:  None. FINDINGS: Criteria: Quantification of carotid stenosis is based on velocity parameters that correlate the residual internal carotid diameter with NASCET-based stenosis levels, using the diameter of the distal internal carotid lumen as the denominator for stenosis measurement. The following velocity measurements were obtained: RIGHT ICA: 57/14 cm/sec CCA: 0000000 cm/sec SYSTOLIC ICA/CCA RATIO:  0.6 ECA: 79 cm/sec LEFT ICA: 102/41 cm/sec  CCA: 0000000 cm/sec SYSTOLIC ICA/CCA RATIO:  1.2 ECA: 61 cm/sec RIGHT CAROTID ARTERY: Minor echogenic shadowing plaque formation. No hemodynamically significant right ICA stenosis, velocity elevation, or turbulent flow. Degree of narrowing less than 50%. RIGHT VERTEBRAL ARTERY:  Antegrade LEFT CAROTID ARTERY: Similar scattered minor echogenic plaque formation. No hemodynamically significant left ICA stenosis, velocity elevation, or turbulent flow. LEFT VERTEBRAL ARTERY:  Antegrade IMPRESSION: Minor carotid atherosclerosis. No hemodynamically significant ICA stenosis. Degree of narrowing less than 50% bilaterally by ultrasound criteria. Patent antegrade vertebral flow bilaterally Electronically Signed   By: Jerilynn Mages.  Shick M.D.   On: 04/10/2019 08:51   Dg Hip Unilat W Or Wo Pelvis 2-3 Views Left  Result Date: 04/09/2019 CLINICAL DATA:  New leg weakness. Fall from standing. EXAM: DG HIP (WITH OR WITHOUT PELVIS) 2-3V LEFT COMPARISON:  CT abdomen pelvis dated  01/08/2017 FINDINGS: There is no evidence of hip fracture or dislocation. Mild degenerative changes are seen in both hips. Degenerative changes are seen in the spine. IMPRESSION: No evidence of hip fracture or dislocation. Electronically Signed   By: Zerita Boers M.D.   On: 04/09/2019 14:51      Subjective: Feels strong, no complaints.  No fever, vomiting.  No cough, dyspnea, sputum production.  No dysuria, suprapubic pain.  Discharge Exam: Vitals:   04/10/19 2342 04/11/19 0810  BP: 119/73 121/80  Pulse: 82 86  Resp: 17   Temp: 97.7 F (36.5 C)   SpO2: 98% 100%   Vitals:   04/10/19 0733 04/10/19 1654 04/10/19 2342 04/11/19 0810  BP: 130/75 116/70 119/73 121/80  Pulse: 76 82 82 86  Resp: 17 15 17    Temp: 98.9 F (37.2 C) 98.6 F (37 C) 97.7 F (36.5 C)   TempSrc: Oral Oral Oral   SpO2: 100% 100% 98% 100%  Weight:      Height:        General: Pt is alert, awake, not in acute distress, lying in bed, interactive Cardiovascular: RRR, nl S1-S2, no murmurs appreciated.   No LE edema.   Respiratory: Normal respiratory rate and rhythm.  CTAB without rales or wheezes. Abdominal: Abdomen soft and non-tender.  No distension or HSM.   Neuro/Psych: Strength symmetric in upper and lower extremities.  Judgment and insight appear impaired congenitally.   The results of significant diagnostics from this hospitalization (including imaging, microbiology, ancillary and laboratory) are listed below for reference.     Microbiology: Recent Results (from the past 240 hour(s))  SARS CORONAVIRUS 2 (TAT 6-24 HRS) Nasopharyngeal Nasopharyngeal Swab     Status: None   Collection Time: 04/09/19  2:41 PM   Specimen: Nasopharyngeal Swab  Result Value Ref [place] Status   SARS Coronavirus 2 NEGATIVE NEGATIVE Final    Comment: (NOTE) SARS-CoV-2 target nucleic acids are NOT DETECTED. The SARS-CoV-2 RNA is generally detectable in upper and lower respiratory specimens during the acute phase of  infection. Negative results do not preclude SARS-CoV-2 infection, do not rule out co-infections with other pathogens, and should not be used as the sole basis for treatment or other patient management decisions. Negative results must be combined with clinical observations, patient history, and epidemiological information. The expected result is Negative. Fact Sheet for Patients: SugarRoll.be Fact Sheet for Healthcare Providers: https://www.woods-mathews.com/ This test is not yet approved or cleared by the [place] FDA and  has been authorized for detection and/or diagnosis of SARS-CoV-2 by FDA under an Emergency Use Authorization (EUA). This EUA will remain  in effect (meaning this test can  be used) for the duration of the COVID-19 declaration under Section 56 4(b)(1) of the Act, 21 U.S.C. section 360bbb-3(b)(1), unless the authorization is terminated or revoked sooner. Performed at [facility], [place] [address], [place], [place] [address]   CULTURE, BLOOD (ROUTINE X 2) w Reflex to ID Panel     Status: None (Preliminary result)   Collection Time: 04/10/19 12:05 PM   Specimen: BLOOD  Result Value Ref [place] Status   Specimen Description BLOOD BLOOD RIGHT HAND  Final   Special Requests   Final    BOTTLES DRAWN AEROBIC AND ANAEROBIC Blood Culture results may not be optimal due to an inadequate volume of blood received in culture bottles   Culture   Final    NO GROWTH < 24 HOURS Performed at [facility], [address], [place], [place] [address]    Report Status PENDING  Incomplete  CULTURE, BLOOD (ROUTINE X 2) w Reflex to ID Panel     Status: None (Preliminary result)   Collection Time: 04/10/19 12:14 PM   Specimen: BLOOD  Result Value Ref [place] Status   Specimen Description BLOOD BLOOD LEFT HAND  Final   Special Requests   Final    BOTTLES DRAWN AEROBIC AND ANAEROBIC Blood Culture results may not be optimal due to an  inadequate volume of blood received in culture bottles   Culture   Final    NO GROWTH < 24 HOURS Performed at [facility], [address] [place], [place] [address]    Report Status PENDING  Incomplete     Labs: BNP (last 3 results) No results for input(s): BNP in the last 8760 hours. Basic Metabolic Panel: Recent Labs  Lab 04/09/19 1213 04/10/19 0648 04/10/19 1223 04/11/19 0308  NA 133* 138  --  138  K 3.1* 3.1*  --  3.4*  CL 95* 102  --  103  CO2 21* 27  --  25  GLUCOSE 143* 112*  --  166*  BUN 35* 28*  --  20  CREATININE 2.46* 1.71*  --  1.27*  CALCIUM 8.6* 8.3*  --  8.3*  MG  --   --  1.9  --    Liver Function Tests: Recent Labs  Lab 04/09/19 1213  AST 35  ALT 23  ALKPHOS 109  BILITOT 0.4  PROT 7.5  ALBUMIN 3.3*   No results for input(s): LIPASE, AMYLASE in the last 168 hours. No results for input(s): AMMONIA in the last 168 hours. CBC: Recent Labs  Lab 04/09/19 1213 04/10/19 0648 04/11/19 0308  WBC 10.1 5.5 5.1  HGB 9.5* 8.9* 9.3*  HCT 30.2* 28.5* 28.1*  MCV 90.1 91.1 88.1  PLT 270 256 267   Cardiac Enzymes: No results for input(s): CKTOTAL, CKMB, CKMBINDEX, TROPONINI in the last 168 hours. BNP: Invalid input(s): POCBNP CBG: Recent Labs  Lab 04/10/19 0738 04/10/19 1230 04/10/19 1657 04/10/19 2100 04/11/19 0823  GLUCAP 103* 155* 134* 129* 117*   D-Dimer No results for input(s): DDIMER in the last 72 hours. Hgb A1c Recent Labs    04/10/19 0648  HGBA1C 5.8*   Lipid Profile Recent Labs    04/10/19 0648  CHOL 146  HDL 49  LDLCALC 69  TRIG 138  CHOLHDL 3.0   Thyroid function studies No results for input(s): TSH, T4TOTAL, T3FREE, THYROIDAB in the last 72 hours.  Invalid input(s): FREET3 Anemia work up No results for input(s): VITAMINB12, FOLATE, FERRITIN, TIBC, IRON, RETICCTPCT in the last 20  hours. Urinalysis    Component Value Date/Time   COLORURINE YELLOW (A) 04/09/2019 1441   APPEARANCEUR HAZY (A) 04/09/2019  1441   LABSPEC 1.015 04/09/2019 1441   PHURINE 5.0 04/09/2019 1441   GLUCOSEU >=500 (A) 04/09/2019 1441   HGBUR SMALL (A) 04/09/2019 1441   BILIRUBINUR NEGATIVE 04/09/2019 1441   KETONESUR NEGATIVE 04/09/2019 1441   PROTEINUR NEGATIVE 04/09/2019 1441   NITRITE NEGATIVE 04/09/2019 1441   LEUKOCYTESUR TRACE (A) 04/09/2019 1441   Sepsis Labs Invalid input(s): PROCALCITONIN,  WBC,  LACTICIDVEN Microbiology Recent Results (from the past 240 hour(s))  SARS CORONAVIRUS 2 (TAT 6-24 HRS) Nasopharyngeal Nasopharyngeal Swab     Status: None   Collection Time: 04/09/19  2:41 PM   Specimen: Nasopharyngeal Swab  Result Value Ref [place] Status   SARS Coronavirus 2 NEGATIVE NEGATIVE Final    Comment: (NOTE) SARS-CoV-2 target nucleic acids are NOT DETECTED. The SARS-CoV-2 RNA is generally detectable in upper and lower respiratory specimens during the acute phase of infection. Negative results do not preclude SARS-CoV-2 infection, do not rule out co-infections with other pathogens, and should not be used as the sole basis for treatment or other patient management decisions. Negative results must be combined with clinical observations, patient history, and epidemiological information. The expected result is Negative. Fact Sheet for Patients: SugarRoll.be Fact Sheet for Healthcare Providers: https://www.woods-mathews.com/ This test is not yet approved or cleared by the [place] FDA and  has been authorized for detection and/or diagnosis of SARS-CoV-2 by FDA under an Emergency Use Authorization (EUA). This EUA will remain  in effect (meaning this test can be used) for the duration of the COVID-19 declaration under Section 56 4(b)(1) of the Act, 21 U.S.C. section 360bbb-3(b)(1), unless the authorization is terminated or revoked sooner. Performed at [facility], [place] [address] [place], [place] [address]   CULTURE, BLOOD (ROUTINE X 2) w Reflex  to ID Panel     Status: None (Preliminary result)   Collection Time: 04/10/19 12:05 PM   Specimen: BLOOD  Result Value Ref [place] Status   Specimen Description BLOOD BLOOD RIGHT HAND  Final   Special Requests   Final    BOTTLES DRAWN AEROBIC AND ANAEROBIC Blood Culture results may not be optimal due to an inadequate volume of blood received in culture bottles   Culture   Final    NO GROWTH < 24 HOURS Performed at [facility], [address] [place], [place] [address]    Report Status PENDING  Incomplete  CULTURE, BLOOD (ROUTINE X 2) w Reflex to ID Panel     Status: None (Preliminary result)   Collection Time: 04/10/19 12:14 PM   Specimen: BLOOD  Result Value Ref [place] Status   Specimen Description BLOOD BLOOD LEFT HAND  Final   Special Requests   Final    BOTTLES DRAWN AEROBIC AND ANAEROBIC Blood Culture results may not be optimal due to an inadequate volume of blood received in culture bottles   Culture   Final    NO GROWTH < 24 HOURS Performed at [facility], [address] [place], [place] [address]    Report Status PENDING  Incomplete     Time coordinating discharge: 35 minutes      SIGNED:   Edwin Dada, MD  [facility] 04/11/2019, 11:55 AM

## 2019-04-11 NOTE — NC FL2 (Signed)
[place] LEVEL OF CARE SCREENING TOOL     IDENTIFICATION  Patient Name: Jason Reed Birthdate: May 21, 1960 Sex: male Admission Date (Current Location): 04/09/2019  [place] and [place] Number:  Engineering geologist and Address:  [facility], [address], [place], [place] [address]      Provider Number: [id number]  Attending Physician Name and Address:  Edwin Dada, *  Relative Name and Phone Number:       Current Level of Care: Hospital Recommended Level of Care: Family Care Home Prior Approval Number:    Date Approved/Denied:   PASRR Number:    Discharge Plan: Domiciliary (Rest home)(Group home, family care home)    Current Diagnoses: Patient Active Problem List   Diagnosis Date Noted  . Lactic acid acidosis 04/10/2019  . Left leg weakness 04/09/2019  . CKD (chronic kidney disease), stage III 04/09/2019  . Hypokalemia 04/09/2019  . Anemia due to chronic kidney disease 04/09/2019  . Essential hypertension 04/09/2019  . Hyperlipidemia   . Sepsis ([place])   . Acute lower UTI   . 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 AKI (acute kidney injury) ([place])   . Obesity (BMI 30-39.9)   . Psychosis ([place])   . Seizure disorder ([place])   . Rectal polyp   . Ectopic gastric tissue   . Stomach irritation   . Loss of weight   . Colon cancer screening   . Edema of colon   . Septic shock ([place]) 01/08/2017    Orientation RESPIRATION BLADDER Height & Weight     Self, Place, Time  Normal Continent Weight: 108.1 kg Height:  6' (182.9 cm)  BEHAVIORAL SYMPTOMS/MOOD NEUROLOGICAL BOWEL NUTRITION STATUS      Continent Diet(heart healthy, carb modified)  AMBULATORY STATUS COMMUNICATION OF NEEDS Skin   Supervision Verbally Normal, Skin abrasions                       Personal Care Assistance Level of Assistance  Bathing, Feeding,  Dressing Bathing Assistance: Limited assistance Feeding assistance: Independent Dressing Assistance: Limited assistance     Functional Limitations Info             SPECIAL CARE FACTORS FREQUENCY  PT (By licensed PT), OT (By licensed OT)     PT Frequency: home health PT OT Frequency: home health OT            Contractures Contractures Info: Not present    Additional Factors Info  Code Status, Allergies Code Status Info: Full Allergies Info: Lisinopril           Current Medications (04/11/2019):  This is the current hospital active medication list Current Facility-Administered Medications  Medication Dose Route Frequency Provider Last Rate Last Dose  . acetaminophen (TYLENOL) tablet 650 mg  650 mg Oral Q6H PRN Danford, Suann Larry, MD       Or  . acetaminophen (TYLENOL) suppository 650 mg  650 mg Rectal Q6H PRN Danford, Suann Larry, MD      . ARIPiprazole (ABILIFY) tablet 15 mg  15 mg Oral Daily Danford, Suann Larry, MD   15 mg at 04/11/19 0926  . aspirin tablet 325 mg  325 mg Oral Daily Edwin Dada, MD   325 mg at 04/11/19 0925  . clonazePAM (KLONOPIN) tablet 1 mg  1 mg Oral QHS Danford, Suann Larry, MD   1 mg  at 04/10/19 2139  . enoxaparin (LOVENOX) injection 40 mg  40 mg Subcutaneous Q24H Edwin Dada, MD   40 mg at 04/10/19 2147  . gabapentin (NEURONTIN) tablet 600 mg  600 mg Oral TID Edwin Dada, MD   600 mg at 04/11/19 [id number]  . insulin aspart (novoLOG) injection 0-15 Units  0-15 Units Subcutaneous TID WC Danford, Suann Larry, MD   3 Units at 04/11/19 1226  . insulin aspart (novoLOG) injection 0-5 Units  0-5 Units Subcutaneous QHS Danford, Suann Larry, MD      . mirtazapine (REMERON SOL-TAB) disintegrating tablet 15 mg  15 mg Oral QHS Danford, Christopher P, MD      . ondansetron (ZOFRAN) tablet 4 mg  4 mg Oral Q6H PRN Danford, Suann Larry, MD       Or  . ondansetron (ZOFRAN) injection 4 mg  4 mg Intravenous Q6H PRN  Danford, Suann Larry, MD      . phenytoin (DILANTIN) ER capsule 100 mg  100 mg Oral q morning - 10a Danford, Suann Larry, MD   100 mg at 04/11/19 0925  . phenytoin (DILANTIN) ER capsule 300 mg  300 mg Oral QHS Edwin Dada, MD   300 mg at 04/10/19 2140  . potassium chloride SA (KLOR-CON) CR tablet 20 mEq  20 mEq Oral BID Edwin Dada, MD   20 mEq at 04/11/19 0923  . QUEtiapine (SEROQUEL) tablet 800 mg  800 mg Oral QHS Edwin Dada, MD   800 mg at 04/10/19 2141  . simvastatin (ZOCOR) tablet 20 mg  20 mg Oral QHS Edwin Dada, MD   20 mg at 04/10/19 2139  . traZODone (DESYREL) tablet 50 mg  50 mg Oral QHS Edwin Dada, MD   50 mg at 04/10/19 2139     Discharge Medications: Medication List        STOP taking these medications       hydrochlorothiazide 25 MG tablet Commonly known as: HYDRODIURIL   losartan 100 MG tablet Commonly known as: COZAAR   metoprolol succinate 25 MG 24 hr tablet Commonly known as: TOPROL-XL             TAKE these medications       acetaminophen 325 MG tablet Commonly known as: TYLENOL Take 650 mg by mouth every 6 (six) hours as needed.   ARIPiprazole 15 MG tablet Commonly known as: ABILIFY Take 15 mg by mouth daily.   aspirin EC 81 MG tablet Take 81 mg by mouth daily.   clonazePAM 1 MG tablet Commonly known as: KLONOPIN Take 1 mg by mouth at bedtime.   Dilantin 100 MG ER capsule Generic drug: phenytoin Take 100-300 mg by mouth 2 (two) times daily. Take 100 mg by mouth in the morning and 300 mg by mouth at bedtime.   gabapentin 600 MG tablet Commonly known as: NEURONTIN Take 600 mg by mouth 3 (three) times daily.   guaiFENesin-dextromethorphan 100-10 MG/5ML syrup Commonly known as: ROBITUSSIN DM Take 10 mLs by mouth every 4 (four) hours as needed for cough.   hydrocortisone cream 1 % Apply 1 application topically 2 (two) times daily as needed.   loratadine 10 MG  tablet Commonly known as: CLARITIN Take 10 mg by mouth daily.   metFORMIN 500 MG tablet Commonly known as: GLUCOPHAGE Take 500 mg by mouth 2 (two) times daily with a meal.   mirtazapine 15 MG tablet Commonly known as: REMERON Take 15 mg by mouth at bedtime.  potassium chloride SA 20 MEQ tablet Commonly known as: KLOR-CON Take 1 tablet (20 mEq total) by mouth daily.   psyllium 58.6 % powder Commonly known as: METAMUCIL Take 1 packet by mouth daily as needed.   QUEtiapine 400 MG tablet Commonly known as: SEROQUEL Take 800 mg by mouth at bedtime.   simvastatin 20 MG tablet Commonly known as: ZOCOR Take 20 mg by mouth at bedtime.   traZODone 50 MG tablet Commonly known as: DESYREL Take 50 mg by mouth at bedtime.             Relevant Imaging Results:  Relevant Lab Results:   Additional Information SSN [id number]  [place] will be with [facility]  Shelbie Hutching, RN

## 2019-04-11 NOTE — Progress Notes (Signed)
Pt being discharged to group home, discharge instructions reviewed with Clara caregiver, states understanding, pt with no complaints at discharge

## 2019-04-11 NOTE — TOC Transition Note (Signed)
Transition of Care [facility]) - CM/SW Discharge Note   Patient Details  Name: Jason Reed MRN: [id number] Date of Birth: 1961/02/16  [facility] CM/SW Contact:  Shelbie Hutching, RN Phone Number: 04/11/2019, 2:08 PM   Clinical Narrative:    Patient is medically ready for discharge.  Patient will return to [facility] and [facility] group [facility] 3#.  Clara the Medical Director is coming to pick patient up.  Patient will discharge with home health services through [facility], Sharmon Revere is aware of discharge today.  Patient has a walker at the group home, OT has recommended a 3 in 1, Sunday Corn with [facility] will bring one up to the room before discharge.    Final next level of care: Group Home(with home health services) Barriers to Discharge: Barriers Resolved   Patient Goals and CMS Choice   CMS Medicare.gov Compare Post Acute Care list provided to:: Patient Represenative (must comment) Choice offered to / list presented to : NA(Clara - group home adminisistrator)  Discharge Placement                       Discharge Plan and Services   Discharge Planning Services: CM Consult Post Acute Care Choice: Home Health          DME Arranged: 3-N-1 DME Agency: [employer] Date DME Agency Contacted: 04/11/19 Time DME Agency Contacted: 63 Representative spoke with at DME Agency: [place]: PT, OT [facility] Agency: [place] Date [place]: 04/10/19 Time [place]: 1211 Representative spoke with at [place]: [place] ([place]) Interventions     Readmission Risk Interventions No flowsheet data found.

## 2019-04-11 NOTE — Progress Notes (Signed)
OT Cancellation Note  Patient Details Name: Jason Reed MRN: [id number] DOB: March 16, 1961   Cancelled Treatment:    Reason Eval/Treat Not Completed: Fatigue/lethargy limiting ability to participate;Patient declined, no reason specified. Pt sleeping upon attempt. Wakes easily to OT's voice. Politely declines therapy and requests to sleep more first. Agreeable to OT re-attempting at later time.   Jeni Salles, MPH, MS, OTR/L ascom [phone] 04/11/19, 11:23 AM

## 2019-04-13 LAB — URINE CULTURE: Culture: 30000 — AB

## 2019-04-15 LAB — CULTURE, BLOOD (ROUTINE X 2)
Culture: NO GROWTH
Culture: NO GROWTH

## 2019-04-17 ENCOUNTER — Ambulatory Visit: Payer: Self-pay | Admitting: Urology

## 2019-04-17 ENCOUNTER — Encounter: Payer: Self-pay | Admitting: Urology

## 2019-06-01 ENCOUNTER — Ambulatory Visit (INDEPENDENT_AMBULATORY_CARE_PROVIDER_SITE_OTHER): Payer: Medicare Other | Admitting: Urology

## 2019-06-01 ENCOUNTER — Other Ambulatory Visit: Payer: Self-pay

## 2019-06-01 VITALS — BP 128/87 | HR 106

## 2019-06-01 DIAGNOSIS — R339 Retention of urine, unspecified: Secondary | ICD-10-CM

## 2019-06-01 DIAGNOSIS — N390 Urinary tract infection, site not specified: Secondary | ICD-10-CM

## 2019-06-01 LAB — BLADDER SCAN AMB NON-IMAGING: Scan Result: 200

## 2019-06-01 MED ORDER — TAMSULOSIN HCL 0.4 MG PO CAPS: 0.4000 mg | ORAL_CAPSULE | Freq: Every day | ORAL | 3 refills | Status: DC

## 2019-06-01 MED ORDER — SULFAMETHOXAZOLE-TRIMETHOPRIM 800-160 MG PO TABS: 1.0000 | ORAL_TABLET | Freq: Two times a day (BID) | ORAL | 0 refills | Status: DC

## 2019-06-01 NOTE — Progress Notes (Signed)
   06/01/2019 10:24 AM   [name] 1960-07-22 [id number]  Reason for visit: Follow up UTI, left hydronephrosis,   HPI: I saw Mr. Boeding in urology clinic for follow-up of the above issues after being hospitalized in November 2020.  He is a 59 year old male with medical history notable for developmental delay, diabetes, hypertension, psychosis, and seizure.  At the time of hospitalization, he had altered mental status and was ultimately found to have an E. coli UTI.  A CT scan was performed which showed a surgically absent right kidney with mild to moderate left hydroureteronephrosis that was unchanged from prior CT in September 2018.  There is also some subtle right-sided bladder wall thickening.  He improved with antibiotics and was discharged.  The patient is a very poor historian and is unsure why he previously had a nephrectomy, and there is no mention of any indication through chart review.  He lives in a group home but does not have a guardian with him today to help with history.  On chart review, he has baseline CKD stage IIIb with baseline creatinine 1.6-1.9.  Most recent creatinine was stable at 1.27 on 04/11/2019, with EGFR greater than 60.  On questioning, he denies any gross hematuria or flank pain, but does report that he gets 1-2 urinary tract infections per year.  He denies any dysuria.  He reports occasional weak stream, and some leakage of urine overnight sometimes.  Urinalysis today is concerning for infection with 11-30 WBCs, 0-2 RBCs, many bacteria, no yeast, nitrite negative, 1+ leukocytes.  Will send for culture.  PVR is elevated at 200 mL today.  He is a very complex and challenging patient to care for, as I am unable to obtain much history at all from the patient and there is no caregiver or guardian with him today.  We discussed prevention strategies for his UTIs including cranberry tablet prophylaxis, adequate hydration, and timed voiding.  It is unclear if his  left-sided chronic hydroureteronephrosis is due to reflux, distal obstruction, or incomplete bladder emptying.  Most likely, secondary to incomplete bladder emptying.  We also discussed the need for evaluation of the thickened bladder wall on recent CT.  We discussed possible etiologies including infection or malignancy.  Start flomax to improve bladder emptying, check PVR at follow up Bactrim DS twice daily today for suspicious urine for UTI, sent for culture RTC 3 weeks with BMP prior to check kidney function, and cystoscopy Further evaluation pending above findings, will continue to try to contact legal guardian  A total of 40 minutes were spent face-to-face with the patient, greater than 50% was spent in patient education, counseling, and coordination of care regarding UTI, solitary kidney, bladder abnormality on CT, and incomplete bladder emptying.  [place], [facility] [address], [place], [place] [address] [phone]

## 2019-06-01 NOTE — Patient Instructions (Addendum)

## 2019-06-02 LAB — URINALYSIS, COMPLETE
Bilirubin, UA: NEGATIVE
Ketones, UA: NEGATIVE
Nitrite, UA: NEGATIVE
Protein,UA: NEGATIVE
Specific Gravity, UA: 1.02 (ref 1.005–1.030)
Urobilinogen, Ur: 0.2 mg/dL (ref 0.2–1.0)
pH, UA: 7 (ref 5.0–7.5)

## 2019-06-02 LAB — MICROSCOPIC EXAMINATION: Mucus, UA: NONE SEEN

## 2019-06-04 LAB — CULTURE, URINE COMPREHENSIVE

## 2019-06-21 ENCOUNTER — Other Ambulatory Visit: Payer: Medicare Other | Admitting: Urology

## 2019-07-12 ENCOUNTER — Encounter: Payer: Self-pay | Admitting: Emergency Medicine

## 2019-07-12 ENCOUNTER — Other Ambulatory Visit: Payer: Self-pay

## 2019-07-12 ENCOUNTER — Emergency Department: Payer: Medicare Other

## 2019-07-12 ENCOUNTER — Inpatient Hospital Stay
Admission: EM | Admit: 2019-07-12 | Discharge: 2019-07-17 | DRG: 871 | Disposition: A | Discharge status: Skilled Nursing Facility | Payer: Medicare Other | Attending: Family Medicine | Admitting: Family Medicine

## 2019-07-12 DIAGNOSIS — D6959 Other secondary thrombocytopenia: Secondary | ICD-10-CM | POA: Diagnosis present

## 2019-07-12 DIAGNOSIS — N1330 Unspecified hydronephrosis: Secondary | ICD-10-CM

## 2019-07-12 DIAGNOSIS — Z1611 Resistance to penicillins: Secondary | ICD-10-CM | POA: Diagnosis present

## 2019-07-12 DIAGNOSIS — Z79899 Other long term (current) drug therapy: Secondary | ICD-10-CM

## 2019-07-12 DIAGNOSIS — A419 Sepsis, unspecified organism: Secondary | ICD-10-CM | POA: Diagnosis not present

## 2019-07-12 DIAGNOSIS — N183 Chronic kidney disease, stage 3 unspecified: Secondary | ICD-10-CM | POA: Diagnosis present

## 2019-07-12 DIAGNOSIS — R531 Weakness: Secondary | ICD-10-CM

## 2019-07-12 DIAGNOSIS — E785 Hyperlipidemia, unspecified: Secondary | ICD-10-CM | POA: Diagnosis present

## 2019-07-12 DIAGNOSIS — N136 Pyonephrosis: Secondary | ICD-10-CM | POA: Diagnosis present

## 2019-07-12 DIAGNOSIS — N179 Acute kidney failure, unspecified: Secondary | ICD-10-CM | POA: Diagnosis present

## 2019-07-12 DIAGNOSIS — B9620 Unspecified Escherichia coli [E. coli] as the cause of diseases classified elsewhere: Secondary | ICD-10-CM

## 2019-07-12 DIAGNOSIS — G40909 Epilepsy, unspecified, not intractable, without status epilepticus: Secondary | ICD-10-CM | POA: Diagnosis not present

## 2019-07-12 DIAGNOSIS — A4151 Sepsis due to Escherichia coli [E. coli]: Secondary | ICD-10-CM | POA: Diagnosis not present

## 2019-07-12 DIAGNOSIS — Z7982 Long term (current) use of aspirin: Secondary | ICD-10-CM | POA: Diagnosis not present

## 2019-07-12 DIAGNOSIS — E1122 Type 2 diabetes mellitus with diabetic chronic kidney disease: Secondary | ICD-10-CM | POA: Diagnosis present

## 2019-07-12 DIAGNOSIS — R7881 Bacteremia: Secondary | ICD-10-CM | POA: Diagnosis not present

## 2019-07-12 DIAGNOSIS — Z7984 Long term (current) use of oral hypoglycemic drugs: Secondary | ICD-10-CM

## 2019-07-12 DIAGNOSIS — N390 Urinary tract infection, site not specified: Secondary | ICD-10-CM | POA: Diagnosis not present

## 2019-07-12 DIAGNOSIS — K5641 Fecal impaction: Secondary | ICD-10-CM | POA: Diagnosis present

## 2019-07-12 DIAGNOSIS — E119 Type 2 diabetes mellitus without complications: Secondary | ICD-10-CM | POA: Diagnosis not present

## 2019-07-12 DIAGNOSIS — A4159 Other Gram-negative sepsis: Secondary | ICD-10-CM | POA: Diagnosis present

## 2019-07-12 DIAGNOSIS — N400 Enlarged prostate without lower urinary tract symptoms: Secondary | ICD-10-CM | POA: Diagnosis present

## 2019-07-12 DIAGNOSIS — F1021 Alcohol dependence, in remission: Secondary | ICD-10-CM | POA: Diagnosis present

## 2019-07-12 DIAGNOSIS — Z833 Family history of diabetes mellitus: Secondary | ICD-10-CM

## 2019-07-12 DIAGNOSIS — Z888 Allergy status to other drugs, medicaments and biological substances status: Secondary | ICD-10-CM

## 2019-07-12 DIAGNOSIS — F29 Unspecified psychosis not due to a substance or known physiological condition: Secondary | ICD-10-CM | POA: Diagnosis present

## 2019-07-12 DIAGNOSIS — I129 Hypertensive chronic kidney disease with stage 1 through stage 4 chronic kidney disease, or unspecified chronic kidney disease: Secondary | ICD-10-CM | POA: Diagnosis present

## 2019-07-12 DIAGNOSIS — Z20822 Contact with and (suspected) exposure to covid-19: Secondary | ICD-10-CM | POA: Diagnosis present

## 2019-07-12 DIAGNOSIS — E860 Dehydration: Secondary | ICD-10-CM | POA: Diagnosis present

## 2019-07-12 DIAGNOSIS — G9341 Metabolic encephalopathy: Secondary | ICD-10-CM | POA: Diagnosis not present

## 2019-07-12 LAB — COMPREHENSIVE METABOLIC PANEL
ALT: 16 U/L (ref 0–44)
AST: 25 U/L (ref 15–41)
Albumin: 3.6 g/dL (ref 3.5–5.0)
Alkaline Phosphatase: 88 U/L (ref 38–126)
Anion gap: 14 (ref 5–15)
BUN: 26 mg/dL — ABNORMAL HIGH (ref 6–20)
CO2: 24 mmol/L (ref 22–32)
Calcium: 8.7 mg/dL — ABNORMAL LOW (ref 8.9–10.3)
Chloride: 99 mmol/L (ref 98–111)
Creatinine, Ser: 1.92 mg/dL — ABNORMAL HIGH (ref 0.61–1.24)
GFR calc Af Amer: 43 mL/min — ABNORMAL LOW (ref >60)
GFR calc non Af Amer: 37 mL/min — ABNORMAL LOW (ref >60)
Glucose, Bld: 202 mg/dL — ABNORMAL HIGH (ref 70–99)
Potassium: 4 mmol/L (ref 3.5–5.1)
Sodium: 137 mmol/L (ref 135–145)
Total Bilirubin: 1.1 mg/dL (ref 0.3–1.2)
Total Protein: 7.7 g/dL (ref 6.5–8.1)

## 2019-07-12 LAB — CBC WITH DIFFERENTIAL/PLATELET
Abs Immature Granulocytes: 0.09 10*3/uL — ABNORMAL HIGH (ref 0.00–0.07)
Basophils Absolute: 0.1 10*3/uL (ref 0.0–0.1)
Basophils Relative: 0 %
Eosinophils Absolute: 0 10*3/uL (ref 0.0–0.5)
Eosinophils Relative: 0 %
HCT: 35 % — ABNORMAL LOW (ref 39.0–52.0)
Hemoglobin: 10.8 g/dL — ABNORMAL LOW (ref 13.0–17.0)
Immature Granulocytes: 1 %
Lymphocytes Relative: 7 %
Lymphs Abs: 0.9 10*3/uL (ref 0.7–4.0)
MCH: 28.8 pg (ref 26.0–34.0)
MCHC: 30.9 g/dL (ref 30.0–36.0)
MCV: 93.3 fL (ref 80.0–100.0)
Monocytes Absolute: 1.3 10*3/uL — ABNORMAL HIGH (ref 0.1–1.0)
Monocytes Relative: 10 %
Neutro Abs: 11.2 10*3/uL — ABNORMAL HIGH (ref 1.7–7.7)
Neutrophils Relative %: 82 %
Platelets: 125 10*3/uL — ABNORMAL LOW (ref 150–400)
RBC: 3.75 MIL/uL — ABNORMAL LOW (ref 4.22–5.81)
RDW: 13.9 % (ref 11.5–15.5)
WBC: 13.5 10*3/uL — ABNORMAL HIGH (ref 4.0–10.5)
nRBC: 0 % (ref 0.0–0.2)

## 2019-07-12 LAB — URINALYSIS, ROUTINE W REFLEX MICROSCOPIC
Bilirubin Urine: NEGATIVE
Glucose, UA: >=500 mg/dL — AB
Ketones, ur: NEGATIVE mg/dL
Nitrite: POSITIVE — AB
Protein, ur: 30 mg/dL — AB
Specific Gravity, Urine: 1.009 (ref 1.005–1.030)
Squamous Epithelial / HPF: NONE SEEN (ref 0–5)
pH: 5 (ref 5.0–8.0)

## 2019-07-12 LAB — LACTIC ACID, PLASMA
Lactic Acid, Venous: 2.3 mmol/L (ref 0.5–1.9)
Lactic Acid, Venous: 1.6 mmol/L (ref 0.5–1.9)

## 2019-07-12 LAB — PROTIME-INR
INR: 1 (ref 0.8–1.2)
Prothrombin Time: 12.9 seconds (ref 11.4–15.2)

## 2019-07-12 MED ORDER — POTASSIUM CHLORIDE CRYS ER 20 MEQ PO TBCR: 20.0000 meq | EXTENDED_RELEASE_TABLET | Freq: Every day | ORAL | Status: DC

## 2019-07-12 MED ORDER — LORATADINE 10 MG PO TABS
10.0000 mg | ORAL_TABLET | Freq: Every day | ORAL | Status: DC
  Administered 2019-07-13 – 2019-07-17 (×6): 10 mg via ORAL
  Filled 2019-07-12 (×6): qty 1

## 2019-07-12 MED ORDER — PSYLLIUM 95 % PO PACK
1.0000 | PACK | Freq: Every day | ORAL | Status: DC
  Administered 2019-07-16: 11:00:00 1 via ORAL
  Filled 2019-07-12 (×5): qty 1

## 2019-07-12 MED ORDER — METRONIDAZOLE IN NACL 5-0.79 MG/ML-% IV SOLN
500.0000 mg | Freq: Once | INTRAVENOUS | Status: AC
  Administered 2019-07-12: 500 mg via INTRAVENOUS
  Filled 2019-07-12: qty 100

## 2019-07-12 MED ORDER — KETOROLAC TROMETHAMINE 30 MG/ML IJ SOLN
15.0000 mg | INTRAMUSCULAR | Status: AC
  Administered 2019-07-12: 15 mg via INTRAVENOUS
  Filled 2019-07-12: qty 1

## 2019-07-12 MED ORDER — IOHEXOL 300 MG/ML  SOLN
75.0000 mL | Freq: Once | INTRAMUSCULAR | Status: AC | PRN
  Administered 2019-07-12: 75 mL via INTRAVENOUS
  Filled 2019-07-12: qty 75

## 2019-07-12 MED ORDER — SODIUM CHLORIDE 0.9 % IV SOLN: INTRAVENOUS | Status: DC

## 2019-07-12 MED ORDER — SODIUM CHLORIDE 0.9 % IV SOLN
2.0000 g | Freq: Once | INTRAVENOUS | Status: AC
  Administered 2019-07-12: 2 g via INTRAVENOUS
  Filled 2019-07-12: qty 2

## 2019-07-12 MED ORDER — ENOXAPARIN SODIUM 40 MG/0.4ML ~~LOC~~ SOLN
40.0000 mg | SUBCUTANEOUS | Status: DC
  Administered 2019-07-13 – 2019-07-16 (×5): 40 mg via SUBCUTANEOUS
  Filled 2019-07-12 (×5): qty 0.4

## 2019-07-12 MED ORDER — SODIUM CHLORIDE 0.9 % IV SOLN
2.0000 g | INTRAVENOUS | Status: DC
  Administered 2019-07-13 – 2019-07-16 (×4): 2 g via INTRAVENOUS
  Filled 2019-07-12 (×4): qty 2
  Filled 2019-07-12: qty 20

## 2019-07-12 MED ORDER — ACETAMINOPHEN 650 MG RE SUPP: 650.0000 mg | Freq: Four times a day (QID) | RECTAL | Status: DC | PRN

## 2019-07-12 MED ORDER — TAMSULOSIN HCL 0.4 MG PO CAPS
0.4000 mg | ORAL_CAPSULE | Freq: Every day | ORAL | Status: DC
  Administered 2019-07-13 – 2019-07-17 (×6): 0.4 mg via ORAL
  Filled 2019-07-12 (×6): qty 1

## 2019-07-12 MED ORDER — ONDANSETRON HCL 4 MG/2ML IJ SOLN: 4.0000 mg | Freq: Four times a day (QID) | INTRAMUSCULAR | Status: DC | PRN

## 2019-07-12 MED ORDER — ONDANSETRON HCL 4 MG PO TABS: 4.0000 mg | ORAL_TABLET | Freq: Four times a day (QID) | ORAL | Status: DC | PRN

## 2019-07-12 MED ORDER — ACETAMINOPHEN 325 MG PO TABS
650.0000 mg | ORAL_TABLET | Freq: Once | ORAL | Status: AC | PRN
  Administered 2019-07-12: 650 mg via ORAL
  Filled 2019-07-12: qty 2

## 2019-07-12 MED ORDER — TRAZODONE HCL 50 MG PO TABS
25.0000 mg | ORAL_TABLET | Freq: Every evening | ORAL | Status: DC | PRN
  Administered 2019-07-15 – 2019-07-16 (×2): 25 mg via ORAL
  Filled 2019-07-12 (×2): qty 1

## 2019-07-12 MED ORDER — VANCOMYCIN HCL IN DEXTROSE 1-5 GM/200ML-% IV SOLN
1000.0000 mg | Freq: Once | INTRAVENOUS | Status: AC
  Administered 2019-07-12: 1000 mg via INTRAVENOUS
  Filled 2019-07-12: qty 200

## 2019-07-12 MED ORDER — CLONAZEPAM 1 MG PO TABS
1.0000 mg | ORAL_TABLET | Freq: Every day | ORAL | Status: DC
  Administered 2019-07-13 – 2019-07-16 (×5): 1 mg via ORAL
  Filled 2019-07-12 (×5): qty 1

## 2019-07-12 MED ORDER — ACETAMINOPHEN 500 MG PO TABS: 1000.0000 mg | ORAL_TABLET | Freq: Once | ORAL | Status: AC

## 2019-07-12 MED ORDER — INSULIN ASPART 100 UNIT/ML ~~LOC~~ SOLN
0.0000 [IU] | Freq: Three times a day (TID) | SUBCUTANEOUS | Status: DC
  Administered 2019-07-13 (×2): 2 [IU] via SUBCUTANEOUS
  Administered 2019-07-13 – 2019-07-14 (×3): 1 [IU] via SUBCUTANEOUS
  Administered 2019-07-14: 2 [IU] via SUBCUTANEOUS
  Administered 2019-07-15: 1 [IU] via SUBCUTANEOUS
  Administered 2019-07-15: 2 [IU] via SUBCUTANEOUS
  Administered 2019-07-15: 1 [IU] via SUBCUTANEOUS
  Administered 2019-07-16: 22:00:00 2 [IU] via SUBCUTANEOUS
  Administered 2019-07-16: 1 [IU] via SUBCUTANEOUS
  Administered 2019-07-16: 17:00:00 2 [IU] via SUBCUTANEOUS
  Administered 2019-07-16 – 2019-07-17 (×3): 1 [IU] via SUBCUTANEOUS
  Filled 2019-07-12 (×13): qty 1

## 2019-07-12 MED ORDER — PHENYTOIN SODIUM EXTENDED 100 MG PO CAPS: 100.0000 mg | ORAL_CAPSULE | Freq: Two times a day (BID) | ORAL | Status: DC

## 2019-07-12 MED ORDER — SIMVASTATIN 20 MG PO TABS
20.0000 mg | ORAL_TABLET | Freq: Every day | ORAL | Status: DC
  Administered 2019-07-13 – 2019-07-16 (×5): 20 mg via ORAL
  Filled 2019-07-12 (×5): qty 1

## 2019-07-12 MED ORDER — GABAPENTIN 600 MG PO TABS
300.0000 mg | ORAL_TABLET | Freq: Three times a day (TID) | ORAL | Status: DC
  Administered 2019-07-13 – 2019-07-17 (×14): 300 mg via ORAL
  Filled 2019-07-12 (×13): qty 1

## 2019-07-12 MED ORDER — MIRTAZAPINE 15 MG PO TABS
15.0000 mg | ORAL_TABLET | Freq: Every day | ORAL | Status: DC
  Administered 2019-07-13 – 2019-07-16 (×5): 15 mg via ORAL
  Filled 2019-07-12 (×5): qty 1

## 2019-07-12 MED ORDER — MAGNESIUM HYDROXIDE 400 MG/5ML PO SUSP
30.0000 mL | Freq: Every day | ORAL | Status: DC | PRN
  Filled 2019-07-12: qty 30

## 2019-07-12 MED ORDER — ACETAMINOPHEN 325 MG PO TABS
650.0000 mg | ORAL_TABLET | Freq: Four times a day (QID) | ORAL | Status: DC | PRN
  Administered 2019-07-13 (×2): 650 mg via ORAL
  Filled 2019-07-12 (×2): qty 2

## 2019-07-12 MED ORDER — ASPIRIN EC 81 MG PO TBEC
81.0000 mg | DELAYED_RELEASE_TABLET | Freq: Every day | ORAL | Status: DC
  Administered 2019-07-13 – 2019-07-17 (×6): 81 mg via ORAL
  Filled 2019-07-12 (×6): qty 1

## 2019-07-12 MED ORDER — ACETAMINOPHEN 500 MG PO TABS
ORAL_TABLET | ORAL | Status: AC
  Administered 2019-07-12: 1000 mg via ORAL
  Filled 2019-07-12: qty 2

## 2019-07-12 MED ORDER — SODIUM CHLORIDE 0.9 % IV BOLUS (SEPSIS)
2000.0000 mL | Freq: Once | INTRAVENOUS | Status: AC
  Administered 2019-07-12: 2000 mL via INTRAVENOUS

## 2019-07-12 NOTE — ED Notes (Signed)
Joni Fears, MD made aware of pt's continued fever

## 2019-07-12 NOTE — ED Notes (Addendum)
Ice packs applied to groin and armpits. MD Mansy made aware of not having a cooling blanket.  PT sponged off with cool rag

## 2019-07-12 NOTE — Progress Notes (Signed)
CODE SEPSIS - PHARMACY COMMUNICATION  **Broad Spectrum Antibiotics should be administered within 1 hour of Sepsis diagnosis**  Time Code Sepsis Called/Page Received: 1610  Antibiotics Ordered: vanc/cefepime  Time of 1st antibiotic administration: [id number]  Additional action taken by pharmacy:   If necessary, Name of Provider/Nurse Contacted:     Tobie Lords ,PharmD Clinical Pharmacist  07/12/2019  10:28 PM

## 2019-07-12 NOTE — ED Provider Notes (Signed)
[facility] Emergency Department Provider Note  ____________________________________________  Time seen: Approximately 4:22 PM  I have reviewed the triage vital signs and the nursing notes.   HISTORY  Chief Complaint Weakness    Level 5 Caveat: Portions of the History and Physical including HPI and review of systems are unable to be completely obtained due to patient being a poor historian due to acute illness  HPI Jason Reed is a 59 y.o. male with a history of hypertension diabetes CKD and seizures who comes to the ED by EMS today due to generalized weakness for the past 2 days.  Patient denies any focal pain complaints, denies shortness of breath or cough.  No headache or neck pain or stiffness.  Has had loss of appetite and poor oral intake.  Patient noted by nursing at triage to be unsteady on his feet requiring full assistance to ambulate at all.      Past Medical History:  Diagnosis Date  . Diabetes ([place])   . HTN (hypertension)   . Hyperlipidemia   . Psychosis ([place])   . Seizure [facility])      Patient Active Problem List   Diagnosis Date Noted  . Lactic acid acidosis 04/10/2019  . Left leg weakness 04/09/2019  . CKD (chronic kidney disease), stage III 04/09/2019  . Hypokalemia 04/09/2019  . Anemia due to chronic kidney disease 04/09/2019  . Essential hypertension 04/09/2019  . Hyperlipidemia   . Sepsis ([place])   . Acute lower UTI   . Diabetes mellitus due to underlying condition with stage 2 chronic kidney disease, with long-term current use of insulin ([place])   . AKI (acute kidney injury) ([place])   . Obesity (BMI 30-39.9)   . Psychosis ([place])   . Seizure disorder ([place])   . Rectal polyp   . Ectopic gastric tissue   . Stomach irritation   . Loss of weight   . Colon cancer screening   . Edema of colon   . Septic shock ([place]) 01/08/2017     Past Surgical History:  Procedure Laterality Date  . COLONOSCOPY WITH PROPOFOL N/A 12/22/2018    Procedure: COLONOSCOPY WITH PROPOFOL;  Surgeon: Virgel Manifold, MD;  Location: [facility] ENDOSCOPY;  Service: Endoscopy;  Laterality: N/A;  . COLONOSCOPY WITH PROPOFOL N/A 12/23/2018   Procedure: COLONOSCOPY WITH PROPOFOL;  Surgeon: Virgel Manifold, MD;  Location: [facility] ENDOSCOPY;  Service: Endoscopy;  Laterality: N/A;  . ESOPHAGOGASTRODUODENOSCOPY (EGD) WITH PROPOFOL N/A 12/22/2018   Procedure: ESOPHAGOGASTRODUODENOSCOPY (EGD) WITH PROPOFOL;  Surgeon: Virgel Manifold, MD;  Location: [facility] ENDOSCOPY;  Service: Endoscopy;  Laterality: N/A;     Prior to Admission medications   Medication Sig Start Date End Date Taking? Authorizing Provider  acetaminophen (TYLENOL) 325 MG tablet Take 650 mg by mouth every 6 (six) hours as needed.    [provider]  ARIPiprazole (ABILIFY) 15 MG tablet Take 15 mg by mouth daily.    [provider]  aspirin EC 81 MG tablet Take 81 mg by mouth daily.    [provider]  clonazePAM (KLONOPIN) 1 MG tablet Take 1 mg by mouth at bedtime.     [provider]  gabapentin (NEURONTIN) 600 MG tablet Take 600 mg by mouth 3 (three) times daily.  10/25/18   [provider]  guaiFENesin-dextromethorphan (ROBITUSSIN DM) 100-10 MG/5ML syrup Take 10 mLs by mouth every 4 (four) hours as needed for cough.    [provider]  hydrocortisone cream 1 % Apply 1 application  topically 2 (two) times daily as needed.     [provider]  loratadine (CLARITIN) 10 MG tablet Take 10 mg by mouth daily.    [provider]  metFORMIN (GLUCOPHAGE) 500 MG tablet Take 500 mg by mouth 2 (two) times daily with a meal.    [provider]  mirtazapine (REMERON) 15 MG tablet Take 15 mg by mouth at bedtime. 03/23/19   [provider]  phenytoin (DILANTIN) 100 MG ER capsule Take 100-300 mg by mouth 2 (two) times daily. Take 100 mg by mouth in the morning and 300 mg by mouth at bedtime.    [provider]   potassium chloride SA (KLOR-CON) 20 MEQ tablet Take 1 tablet (20 mEq total) by mouth daily. 04/11/19   Danford, Suann Larry, MD  psyllium (METAMUCIL) 58.6 % powder Take 1 packet by mouth daily as needed.     [provider]  QUEtiapine (SEROQUEL) 400 MG tablet Take 800 mg by mouth at bedtime.    [provider]  simvastatin (ZOCOR) 20 MG tablet Take 20 mg by mouth at bedtime.    [provider]  sulfamethoxazole-trimethoprim (BACTRIM DS) 800-160 MG tablet Take 1 tablet by mouth 2 (two) times daily. 06/01/19   Billey Co, MD  tamsulosin (FLOMAX) 0.4 MG CAPS capsule Take 1 capsule (0.4 mg total) by mouth daily. 06/01/19   Billey Co, MD  traZODone (DESYREL) 50 MG tablet Take 50 mg by mouth at bedtime.    [provider]     Allergies Lisinopril   Family History  Family history unknown: Yes    Social History Social History   Tobacco Use  . Smoking status: Never Smoker  . Smokeless tobacco: Never Used  Substance Use Topics  . Alcohol use: No  . Drug use: Not Currently    Review of Systems Level 5 Caveat: Portions of the History and Physical including HPI and review of systems are unable to be completely obtained due to patient being a poor historian   Constitutional:   No known fever.  ENT:   No rhinorrhea. Cardiovascular:   No chest pain or syncope. Respiratory:   No dyspnea or cough. Gastrointestinal:   Negative for abdominal pain, vomiting and diarrhea.  Musculoskeletal:   Negative for focal pain or swelling ____________________________________________   PHYSICAL EXAM:  VITAL SIGNS: ED Triage Vitals  Enc Vitals Group     BP 07/12/19 1456 (!) 161/119     Pulse Rate 07/12/19 1456 (!) 116     Resp 07/12/19 1456 16     Temp 07/12/19 1456 (!) 103.1 F (39.5 C)     Temp Source 07/12/19 1456 Oral     SpO2 07/12/19 1456 95 %     Weight 07/12/19 1457 220 lb (99.8 kg)     Height 07/12/19 1457 6' (1.829 m)     Head  Circumference --      Peak Flow --      Pain Score 07/12/19 1456 0     Pain Loc --      Pain Edu? --      Excl. in [place]? --     Vital signs reviewed, nursing assessments reviewed.   Constitutional: Awake and alert.  Oriented to person and place.  Ill-appearing. Eyes:   Conjunctivae are normal. EOMI. PERRL. ENT      Head:   Normocephalic and atraumatic.      Nose:   No congestion/rhinnorhea.  Mouth/Throat:   Dry mucous membranes, no pharyngeal erythema. No peritonsillar mass.       Neck:   No meningismus. Full ROM. Hematological/Lymphatic/Immunilogical:   No cervical lymphadenopathy. Cardiovascular:   Tachycardia heart rate 110. Symmetric bilateral radial and DP pulses.  No murmurs. Cap refill less than 2 seconds. Respiratory:   Normal respiratory effort without tachypnea/retractions. Breath sounds are clear and equal bilaterally. No wheezes/rales/rhonchi. Gastrointestinal:   Soft and nontender. Non distended. There is no CVA tenderness.  No rebound, rigidity, or guarding.  Musculoskeletal:   Normal range of motion in all extremities. No joint effusions.  No lower extremity tenderness.  No edema. Neurologic:   Normal speech and language.  Motor grossly intact. No acute focal neurologic deficits are appreciated.  Skin:    Skin is warm, dry and intact. No rash noted.  No petechiae, purpura, or bullae.  ____________________________________________    LABS (pertinent positives/negatives) (all labs ordered are listed, but only abnormal results are displayed) Labs Reviewed  COMPREHENSIVE METABOLIC PANEL - Abnormal; Notable for the following components:      Result Value   Glucose, Bld 202 (*)    BUN 26 (*)    Creatinine, Ser 1.92 (*)    Calcium 8.7 (*)    GFR calc non Af Amer 37 (*)    GFR calc Af Amer 43 (*)    All other components within normal limits  LACTIC ACID, PLASMA - Abnormal; Notable for the following components:   Lactic Acid, Venous 2.3 (*)    All other  components within normal limits  CBC WITH DIFFERENTIAL/PLATELET - Abnormal; Notable for the following components:   WBC 13.5 (*)    RBC 3.75 (*)    Hemoglobin 10.8 (*)    HCT 35.0 (*)    Platelets 125 (*)    Neutro Abs 11.2 (*)    Monocytes Absolute 1.3 (*)    Abs Immature Granulocytes 0.09 (*)    All other components within normal limits  URINALYSIS, ROUTINE W REFLEX MICROSCOPIC - Abnormal; Notable for the following components:   Color, Urine YELLOW (*)    APPearance CLEAR (*)    Glucose, UA >=500 (*)    Hgb urine dipstick MODERATE (*)    Protein, ur 30 (*)    Nitrite POSITIVE (*)    Leukocytes,Ua TRACE (*)    Bacteria, UA FEW (*)    All other components within normal limits  CULTURE, BLOOD (ROUTINE X 2)  CULTURE, BLOOD (ROUTINE X 2)  URINE CULTURE  SARS CORONAVIRUS 2 (TAT 6-24 HRS)  LACTIC ACID, PLASMA  PROTIME-INR  POC SARS CORONAVIRUS 2 AG -  ED   ____________________________________________   EKG    ____________________________________________    RADIOLOGY  CT ABDOMEN PELVIS W CONTRAST  Result Date: 07/12/2019 CLINICAL DATA:  Abdominal pain, distension, and fever. Weakness for 2 days. EXAM: CT ABDOMEN AND PELVIS WITH CONTRAST TECHNIQUE: Multidetector CT imaging of the abdomen and pelvis was performed using the standard protocol following bolus administration of intravenous contrast. CONTRAST:  5mL OMNIPAQUE IOHEXOL 300 MG/ML  SOLN COMPARISON:  Noncontrast CT 03/09/2019 FINDINGS: Lower chest: Streaky atelectasis at the right lung base. No pleural fluid. Coronary artery calcifications. Hepatobiliary: Decreased hepatic density consistent with steatosis. Subcentimeter cyst in the central right lobe of the liver. Gallbladder is decompressed and displaced anteriorly. No calcified gallstone. No pericholecystic inflammation. Pancreas: No ductal dilatation or inflammation. Spleen: Normal in size without focal abnormality. Adrenals/Urinary Tract: No adrenal nodule. Absent right  kidney. There is left  hydroureteronephrosis to the distal ureter in the pelvis. Hydronephrosis has slightly increased from prior exam. Similar perinephric edema. There is excretion in the left renal collecting system on delayed phase imaging. Urinary bladder is displaced anteriorly by stool distended rectum. No bladder wall thickening. Posterior bladder wall irregularity is unchanged. No visualized urolithiasis. Stomach/Bowel: Nondistended stomach. Small bowel is nondistended. Normal appendix. Large volume of stool throughout the colon with diffuse colonic tortuosity, similar in appearance to prior exam. There is sigmoid and transverse colonic redundancy. No colonic wall thickening or inflammatory change. Stool distends the rectum with rectal diameter of 8.2 cm. Associated rectal wall thickening and perirectal edema no pneumatosis. Vascular/Lymphatic: Normal caliber abdominal aorta. The portal vein is patent. No bulky abdominopelvic adenopathy. Reproductive: Prominent prostate gland spans 5.7 cm. Other: Mild presacral edema related to rectal wall thickening. No ascites or free air. Small fat containing umbilical hernia. Mild generalized body wall edema. Musculoskeletal: There are no acute or suspicious osseous abnormalities. Degenerative change in the lumbar spine. IMPRESSION: 1. Colonic tortuosity and redundancy with large stool burden, similar to prior exam. Stool distended rectum with rectal wall thickening and perirectal edema. Findings suggest fecal impaction in the setting of chronic colonic dysmotility. 2. Left hydroureteronephrosis to the distal ureter, also present on November 2020, slightly increased in degree from prior exam. No obstructing stone. Cause of obstruction may be stool distended rectum. There is perinephric edema, also present on prior. Recommend correlation with urinalysis. 3. Hepatic steatosis. Electronically Signed   By: Keith Rake M.D.   On: 07/12/2019 18:42   DG Chest Port 1  View  Result Date: 07/12/2019 CLINICAL DATA:  Fever, tachycardia and weakness. EXAM: PORTABLE CHEST 1 VIEW COMPARISON:  04/09/2019 FINDINGS: Poor inspiration. Borderline cardiomegaly. Aortic tortuosity. Patchy bilateral lung densities that could relate to the poor inspiration or could indicate bilateral pneumonia. No dense consolidation, lobar collapse or effusion. IMPRESSION: Poor inspiration. Patchy bilateral lung densities that could relate to poor inspiration or could indicate pneumonia. No dense consolidation or lobar collapse. Electronically Signed   By: Nelson Chimes M.D.   On: 07/12/2019 16:41    ____________________________________________   PROCEDURES .Critical Care Performed by: Carrie Mew, MD Authorized by: Carrie Mew, MD   Critical care provider statement:    Critical care time (minutes):  35   Critical care time was exclusive of:  Separately billable procedures and treating other patients   Critical care was necessary to treat or prevent imminent or life-threatening deterioration of the following conditions:  Sepsis and dehydration   Critical care was time spent personally by me on the following activities:  Development of treatment plan with patient or surrogate, discussions with consultants, evaluation of patient's response to treatment, examination of patient, obtaining history from patient or surrogate, ordering and performing treatments and interventions, ordering and review of laboratory studies, ordering and review of radiographic studies, pulse oximetry, re-evaluation of patient's condition and review of old charts    ____________________________________________  DIFFERENTIAL DIAGNOSIS   Sepsis, UTI, pneumonia, COVID-19, dehydration, electrolyte abnormality.  Doubt meningitis or encephalitis.  Doubt endocarditis, osteomyelitis, necrotizing fasciitis, SSTI.  CLINICAL IMPRESSION / ASSESSMENT AND PLAN / ED COURSE  Medications ordered in the  ED: Medications  metroNIDAZOLE (FLAGYL) IVPB 500 mg (500 mg Intravenous New Bag/Given 07/12/19 1842)  acetaminophen (TYLENOL) tablet 650 mg (650 mg Oral Given 07/12/19 1521)  sodium chloride 0.9 % bolus 2,000 mL (2,000 mLs Intravenous New Bag/Given 07/12/19 1701)  ceFEPIme (MAXIPIME) 2 g in sodium chloride 0.9 % 100 mL  IVPB (0 g Intravenous Stopped 07/12/19 1824)  vancomycin (VANCOCIN) IVPB 1000 mg/200 mL premix (1,000 mg Intravenous New Bag/Given 07/12/19 1726)  iohexol (OMNIPAQUE) 300 MG/ML solution 75 mL (75 mLs Intravenous Contrast Given 07/12/19 1745)  ketorolac (TORADOL) 30 MG/ML injection 15 mg (15 mg Intravenous Given 07/12/19 1830)    Pertinent labs & imaging results that were available during my care of the patient were reviewed by me and considered in my medical decision making (see chart for details).   Jason Reed was evaluated in Emergency Department on 07/12/2019 for the symptoms described in the history of present illness. He was evaluated in the context of the global COVID-19 pandemic, which necessitated consideration that the patient might be at risk for infection with the SARS-CoV-2 virus that causes COVID-19. Institutional protocols and algorithms that pertain to the evaluation of patients at risk for COVID-19 are in a state of rapid change based on information released by regulatory bodies including the CDC and federal and state organizations. These policies and algorithms were followed during the patient's care in the ED.   Patient presents with generalized weakness for the past 2 days, found to be very weak and unsteady on his feet.  Initial vital signs revealed tachycardia and fever, and clinically he is dehydrated and not well-appearing.  No signs of shock initially, blood pressure is adequate.  We will initiate sepsis work-up with lab panel, chest x-ray, Covid test.  Give 2 L IV fluids for resuscitation and hydration.  Empiric antibiotics with cefepime, vancomycin,  Flagyl.  Clinical Course as of Jul 12 1927  Wed Jul 12, 2019  1628 Cxr image interpreted by me, shows bibasilar haziness, no definite airspace infiltrate. There is a large loop of bowel in the upper abdomen concerning for SBO/volvulus - will obtain CT a/p. Doubt perforation given benign abd exam   [PS]    Clinical Course User Index [PS] Carrie Mew, MD    ----------------------------------------- 7:29 PM on 07/12/2019 -----------------------------------------  Repeat lactate normalized.  Blood pressure remains normal.  CT of the abdomen consistent with chronic constipation and chronic hydronephrosis, no acute findings to suggest obstruction perforation or volvulus or other acute intra-abdominal pathology.  UA is positive for a UTI as the source of sepsis.  Sepsis reassessment has been completed.   ____________________________________________   FINAL CLINICAL IMPRESSION(S) / ED DIAGNOSES    Final diagnoses:  Sepsis, due to unspecified organism, unspecified whether acute organ dysfunction present ([place])  Generalized weakness  Seizure disorder ([place])  Type 2 diabetes mellitus without complication, without long-term current use of insulin [facility])     ED Discharge Orders    None      Portions of this note were generated with dragon dictation software. Dictation errors may occur despite best attempts at proofreading.   Carrie Mew, MD 07/12/19 1930

## 2019-07-12 NOTE — ED Triage Notes (Signed)
Pt in via [facility] from home, reports generalized weakness x 2 days.  Pt febrile at 103 upon arrival.  NAD noted at this time.

## 2019-07-12 NOTE — ED Notes (Signed)
Pt taken to ct 

## 2019-07-12 NOTE — ED Notes (Signed)
While lab was preforming blood draw, pt incontinent of urine. Pt taken to bathroom and cleaned with wipes and placed into a gown. Pt unsteady on feet. Pt required two person assist.

## 2019-07-12 NOTE — ED Notes (Signed)
Pt is difficult stick, this RN unable to collect all of necessary blood specimens, including blood cultures.  Lab notified to collect remaining specimens.

## 2019-07-12 NOTE — ED Notes (Signed)
Sent Dr. Sidney Ace a message regarding pt's temperature

## 2019-07-12 NOTE — H&P (Addendum)
[facility] [place] [facility] NAME: Jason Reed    MR#:  [id number]  DATE OF BIRTH:  [birth year]  DATE OF ADMISSION:  07/12/2019  PRIMARY CARE PHYSICIAN: Marguerita Merles, MD   REQUESTING/REFERRING PHYSICIAN: Brenton Grills, MD CHIEF COMPLAINT:   Chief Complaint  Patient presents with  . Weakness    HISTORY OF PRESENT ILLNESS:  Jason Reed  is a 59 y.o. African-American male with a known history of hypertension, type diabetes mellitus, dyslipidemia, seizure disorder and psychosis, who presented to the emergency room, who presented to the emergency room with acute onset of generalized weakness over the last couple of days with associated altered mental status with confusion.  He admitted to urinary frequency without dysuria, oliguria or hematuria or flank pain.  He admitted to having fever and cold chills.  He denies any chest pain or dyspnea or cough.  No rhinorrhea or nasal congestion or sore throat.  No nausea or vomiting or abdominal pain.  Upon presentation to the emergency room, temperature was 103.1 with a blood pressure 161/119 pulse 116.  Later on 10 she was down to one 1.5 after Tylenol and again went up to 102.8.  Blood pressure is gradually come down to 107/60.  Labs revealed leukocytosis of 13.5 with neutrophilia, lactic acid of 2.3 and later 1.6 and blood glucose of 202 with a BUN of 26 and creatinine of 1.92 above previous levels of 20/1.27 on 04/11/2019.  Urinalysis was positive for UTI and and showed more than 500 glucose.  The patient was initially managed for sepsis with broad-spectrum IV antibiotics including IV cefepime, vancomycin and Flagyl.  He was also given 1 g of p.o. Tylenol and later 650 mg p.o. and Toradol 30 mg IV as well as 2 L bolus of IV normal saline.  He will be admitted to a medically monitored bed for further evaluation and management.  PAST MEDICAL HISTORY:   Past Medical History:  Diagnosis Date  . Diabetes ([place])   . HTN  (hypertension)   . Hyperlipidemia   . Psychosis ([place])   . Seizure ([place])     PAST SURGICAL HISTORY:   Past Surgical History:  Procedure Laterality Date  . COLONOSCOPY WITH PROPOFOL N/A 12/22/2018   Procedure: COLONOSCOPY WITH PROPOFOL;  Surgeon: Virgel Manifold, MD;  Location: [facility] ENDOSCOPY;  Service: Endoscopy;  Laterality: N/A;  . COLONOSCOPY WITH PROPOFOL N/A 12/23/2018   Procedure: COLONOSCOPY WITH PROPOFOL;  Surgeon: Virgel Manifold, MD;  Location: [facility] ENDOSCOPY;  Service: Endoscopy;  Laterality: N/A;  . ESOPHAGOGASTRODUODENOSCOPY (EGD) WITH PROPOFOL N/A 12/22/2018   Procedure: ESOPHAGOGASTRODUODENOSCOPY (EGD) WITH PROPOFOL;  Surgeon: Virgel Manifold, MD;  Location: [facility] ENDOSCOPY;  Service: Endoscopy;  Laterality: N/A;    SOCIAL HISTORY:   Social History   Tobacco Use  . Smoking status: Never Smoker  . Smokeless tobacco: Never Used  Substance Use Topics  . Alcohol use: No    FAMILY HISTORY:   Positive for diabetes mellitus in his mother.  DRUG ALLERGIES:   Allergies  Allergen Reactions  . Lisinopril Other (See Comments)    Unknown    REVIEW OF SYSTEMS:   ROS As per history of present illness. All pertinent systems were reviewed above. Constitutional,  HEENT, cardiovascular, respiratory, GI, GU, musculoskeletal, neuro, psychiatric, endocrine,  integumentary and hematologic systems were reviewed and are otherwise  negative/unremarkable except for positive findings mentioned above in the HPI.   MEDICATIONS AT HOME:   Prior to Admission medications  Medication Sig Start Date End Date Taking? Authorizing Provider  acetaminophen (TYLENOL) 325 MG tablet Take 650 mg by mouth every 6 (six) hours as needed.    [provider]  ARIPiprazole (ABILIFY) 15 MG tablet Take 15 mg by mouth daily.    [provider]  aspirin EC 81 MG tablet Take 81 mg by mouth daily.    [provider]  clonazePAM (KLONOPIN) 1 MG tablet Take 1 mg by  mouth at bedtime.     [provider]  gabapentin (NEURONTIN) 600 MG tablet Take 600 mg by mouth 3 (three) times daily.  10/25/18   [provider]  guaiFENesin-dextromethorphan (ROBITUSSIN DM) 100-10 MG/5ML syrup Take 10 mLs by mouth every 4 (four) hours as needed for cough.    [provider]  hydrocortisone cream 1 % Apply 1 application topically 2 (two) times daily as needed.     [provider]  loratadine (CLARITIN) 10 MG tablet Take 10 mg by mouth daily.    [provider]  metFORMIN (GLUCOPHAGE) 500 MG tablet Take 500 mg by mouth 2 (two) times daily with a meal.    [provider]  mirtazapine (REMERON) 15 MG tablet Take 15 mg by mouth at bedtime. 03/23/19   [provider]  phenytoin (DILANTIN) 100 MG ER capsule Take 100-300 mg by mouth 2 (two) times daily. Take 100 mg by mouth in the morning and 300 mg by mouth at bedtime.    [provider]  potassium chloride SA (KLOR-CON) 20 MEQ tablet Take 1 tablet (20 mEq total) by mouth daily. 04/11/19   Danford, Suann Larry, MD  psyllium (METAMUCIL) 58.6 % powder Take 1 packet by mouth daily as needed.     [provider]  QUEtiapine (SEROQUEL) 400 MG tablet Take 800 mg by mouth at bedtime.    [provider]  simvastatin (ZOCOR) 20 MG tablet Take 20 mg by mouth at bedtime.    [provider]  sulfamethoxazole-trimethoprim (BACTRIM DS) 800-160 MG tablet Take 1 tablet by mouth 2 (two) times daily. 06/01/19   Billey Co, MD  tamsulosin (FLOMAX) 0.4 MG CAPS capsule Take 1 capsule (0.4 mg total) by mouth daily. 06/01/19   Billey Co, MD  traZODone (DESYREL) 50 MG tablet Take 50 mg by mouth at bedtime.    [provider]      VITAL SIGNS:  Blood pressure 118/62, pulse (!) 101, temperature (!) 102.7 F (39.3 C), temperature source Oral, resp. rate (!) 22, height 6' (1.829 m), weight 99.8 kg, SpO2 99 %.  PHYSICAL EXAMINATION:    Physical Exam  GENERAL:  59 y.o.-year-old lethargic and somnolent African-American male patient lying in the bed with no acute distress.  EYES: Pupils equal, round, reactive to light and accommodation. No scleral icterus. Extraocular muscles intact.  HEENT: Head atraumatic, normocephalic. Oropharynx and nasopharynx clear.  NECK:  Supple, no jugular venous distention. No thyroid enlargement, no tenderness.  LUNGS: Normal breath sounds bilaterally, no wheezing, rales,rhonchi or crepitation. No use of accessory muscles of respiration.  CARDIOVASCULAR: Regular rate and rhythm, S1, S2 normal. No murmurs, rubs, or gallops.  ABDOMEN: Soft, nondistended, nontender. Bowel sounds present. No organomegaly or mass.  EXTREMITIES: No pedal edema, cyanosis, or clubbing.  NEUROLOGIC: Cranial nerves II through XII are intact. Muscle strength 5/5 in all extremities. Sensation intact. Gait not checked.  PSYCHIATRIC: The patient is currently alert but somnolent and slow to answer questions.  Normal affect and good eye contact.  SKIN: No obvious rash, lesion, or ulcer.   LABORATORY PANEL:   CBC Recent Labs  Lab 07/12/19 1502  WBC 13.5*  HGB 10.8*  HCT 35.0*  PLT 125*   ------------------------------------------------------------------------------------------------------------------  Chemistries  Recent Labs  Lab 07/12/19 1502  NA 137  K 4.0  CL 99  CO2 24  GLUCOSE 202*  BUN 26*  CREATININE 1.92*  CALCIUM 8.7*  AST 25  ALT 16  ALKPHOS 88  BILITOT 1.1   ------------------------------------------------------------------------------------------------------------------  Cardiac Enzymes No results for input(s): TROPONINI in the last 168 hours. ------------------------------------------------------------------------------------------------------------------  RADIOLOGY:  CT ABDOMEN PELVIS W CONTRAST  Result Date: 07/12/2019 CLINICAL DATA:  Abdominal pain, distension, and fever. Weakness for  2 days. EXAM: CT ABDOMEN AND PELVIS WITH CONTRAST TECHNIQUE: Multidetector CT imaging of the abdomen and pelvis was performed using the standard protocol following bolus administration of intravenous contrast. CONTRAST:  42mL OMNIPAQUE IOHEXOL 300 MG/ML  SOLN COMPARISON:  Noncontrast CT 03/09/2019 FINDINGS: Lower chest: Streaky atelectasis at the right lung base. No pleural fluid. Coronary artery calcifications. Hepatobiliary: Decreased hepatic density consistent with steatosis. Subcentimeter cyst in the central right lobe of the liver. Gallbladder is decompressed and displaced anteriorly. No calcified gallstone. No pericholecystic inflammation. Pancreas: No ductal dilatation or inflammation. Spleen: Normal in size without focal abnormality. Adrenals/Urinary Tract: No adrenal nodule. Absent right kidney. There is left hydroureteronephrosis to the distal ureter in the pelvis. Hydronephrosis has slightly increased from prior exam. Similar perinephric edema. There is excretion in the left renal collecting system on delayed phase imaging. Urinary bladder is displaced anteriorly by stool distended rectum. No bladder wall thickening. Posterior bladder wall irregularity is unchanged. No visualized urolithiasis. Stomach/Bowel: Nondistended stomach. Small bowel is nondistended. Normal appendix. Large volume of stool throughout the colon with diffuse colonic tortuosity, similar in appearance to prior exam. There is sigmoid and transverse colonic redundancy. No colonic wall thickening or inflammatory change. Stool distends the rectum with rectal diameter of 8.2 cm. Associated rectal wall thickening and perirectal edema no pneumatosis. Vascular/Lymphatic: Normal caliber abdominal aorta. The portal vein is patent. No bulky abdominopelvic adenopathy. Reproductive: Prominent prostate gland spans 5.7 cm. Other: Mild presacral edema related to rectal wall thickening. No ascites or free air. Small fat containing umbilical hernia.  Mild generalized body wall edema. Musculoskeletal: There are no acute or suspicious osseous abnormalities. Degenerative change in the lumbar spine. IMPRESSION: 1. Colonic tortuosity and redundancy with large stool burden, similar to prior exam. Stool distended rectum with rectal wall thickening and perirectal edema. Findings suggest fecal impaction in the setting of chronic colonic dysmotility. 2. Left hydroureteronephrosis to the distal ureter, also present on November 2020, slightly increased in degree from prior exam. No obstructing stone. Cause of obstruction may be stool distended rectum. There is perinephric edema, also present on prior. Recommend correlation with urinalysis. 3. Hepatic steatosis. Electronically Signed   By: Keith Rake M.D.   On: 07/12/2019 18:42   DG Chest Port 1 View  Result Date: 07/12/2019 CLINICAL DATA:  Fever, tachycardia and weakness. EXAM: PORTABLE CHEST 1 VIEW COMPARISON:  04/09/2019 FINDINGS: Poor inspiration. Borderline cardiomegaly. Aortic tortuosity. Patchy bilateral lung densities that could relate to the poor inspiration or could indicate bilateral pneumonia. No dense consolidation, lobar collapse or effusion. IMPRESSION: Poor inspiration. Patchy bilateral lung densities that could relate to poor inspiration or could indicate pneumonia. No dense consolidation or lobar collapse. Electronically Signed   By: Nelson Chimes M.D.   On: 07/12/2019 16:41      IMPRESSION AND  PLAN:   1.  Sepsis with subsequent metabolic encephalopathy and generalized weakness. -This is likely secondary to UTI. -The patient will be admitted to a medically monitored bed. -We will continue antibiotic therapy with IV Rocephin. -We will follow blood and urine cultures. -We will continue hydration with IV normal saline.  2.  Acute kidney injury. -This likely prerenal secondary to dehydration and volume depletion. -The patient will be hydrated with IV normal saline and will follow his  BMP.  3.  UTI. -Management as above.  4.  Type 2 diabetes mellitus. -The patient will be placed on supplemental coverage with NovoLog and will hold Metformin.  5.  Psychotic disorder. -We will hold off sedatives while he is lethargic and given his recurrent fever will hold off psychotropics.  6.  Seizure disorder. -We will continue Dilantin and check its level.  7.  Dyslipidemia. -We will continue statin therapy.  8.  BPH. -We will continue Flomax.  9.  DVT prophylaxis. -Subcutaneous Lovenox.  All the records are reviewed and case discussed with ED provider. The plan of care was discussed in details with the patient (and family). I answered all questions. The patient agreed to proceed with the above mentioned plan. Further management will depend upon hospital course.   CODE STATUS: Full code  TOTAL TIME TAKING CARE OF THIS PATIENT: 55 minutes.    Christel Mormon M.D on 07/12/2019 at 8:51 PM  [facility]   From 7 PM-7 AM, contact night-coverage [email or web address]  CC: Primary care physician; Marguerita Merles, MD   Note: This dictation was prepared with Dragon dictation along with smaller phrase technology. Any transcriptional errors that result from this process are unintentional.

## 2019-07-12 NOTE — Progress Notes (Signed)
CODE SEPSIS - PHARMACY COMMUNICATION  **Broad Spectrum Antibiotics should be administered within 1 hour of Sepsis diagnosis**  Time Code Sepsis Called/Page Received: 1615  Antibiotics Ordered: Vancomycin, cefepime, metronidazole  Time of 1st antibiotic administration: [id number]  Additional action taken by pharmacy: Ruffin Pyo @ 1650 about administering antibiotics within 1 hour of code sepsis.       Rowland Lathe ,PharmD Clinical Pharmacist  07/12/2019  4:19 PM

## 2019-07-12 NOTE — Consult Note (Signed)
PHARMACY -  BRIEF ANTIBIOTIC NOTE   Pharmacy has received consult(s) for Cefepime/Vancomycin from an ED provider.  The patient's profile has been reviewed for ht/wt/allergies/indication/available labs.    One time order(s) placed for Vancomycin and cefepime   Further antibiotics/pharmacy consults should be ordered by admitting physician if indicated.                       Thank you, Rowland Lathe 07/12/2019  4:20 PM

## 2019-07-13 DIAGNOSIS — A419 Sepsis, unspecified organism: Secondary | ICD-10-CM

## 2019-07-13 DIAGNOSIS — R531 Weakness: Secondary | ICD-10-CM

## 2019-07-13 DIAGNOSIS — B9620 Unspecified Escherichia coli [E. coli] as the cause of diseases classified elsewhere: Secondary | ICD-10-CM

## 2019-07-13 DIAGNOSIS — R7881 Bacteremia: Secondary | ICD-10-CM

## 2019-07-13 LAB — CBC
HCT: 32.1 % — ABNORMAL LOW (ref 39.0–52.0)
Hemoglobin: 10.1 g/dL — ABNORMAL LOW (ref 13.0–17.0)
MCH: 28.9 pg (ref 26.0–34.0)
MCHC: 31.5 g/dL (ref 30.0–36.0)
MCV: 92 fL (ref 80.0–100.0)
Platelets: 113 10*3/uL — ABNORMAL LOW (ref 150–400)
RBC: 3.49 MIL/uL — ABNORMAL LOW (ref 4.22–5.81)
RDW: 13.8 % (ref 11.5–15.5)
WBC: 11.4 10*3/uL — ABNORMAL HIGH (ref 4.0–10.5)
nRBC: 0 % (ref 0.0–0.2)

## 2019-07-13 LAB — PROTIME-INR
INR: 1.1 (ref 0.8–1.2)
Prothrombin Time: 14.2 seconds (ref 11.4–15.2)

## 2019-07-13 LAB — BLOOD CULTURE ID PANEL (REFLEXED)

## 2019-07-13 LAB — BASIC METABOLIC PANEL
Anion gap: 10 (ref 5–15)
BUN: 24 mg/dL — ABNORMAL HIGH (ref 6–20)
CO2: 25 mmol/L (ref 22–32)
Calcium: 8.1 mg/dL — ABNORMAL LOW (ref 8.9–10.3)
Chloride: 103 mmol/L (ref 98–111)
Creatinine, Ser: 1.77 mg/dL — ABNORMAL HIGH (ref 0.61–1.24)
GFR calc Af Amer: 47 mL/min — ABNORMAL LOW (ref >60)
GFR calc non Af Amer: 41 mL/min — ABNORMAL LOW (ref >60)
Glucose, Bld: 154 mg/dL — ABNORMAL HIGH (ref 70–99)
Potassium: 3.7 mmol/L (ref 3.5–5.1)
Sodium: 138 mmol/L (ref 135–145)

## 2019-07-13 LAB — URINE CULTURE

## 2019-07-13 LAB — PROCALCITONIN: Procalcitonin: 11.73 ng/mL

## 2019-07-13 LAB — GLUCOSE, CAPILLARY
Glucose-Capillary: 186 mg/dL — ABNORMAL HIGH (ref 70–99)
Glucose-Capillary: 126 mg/dL — ABNORMAL HIGH (ref 70–99)
Glucose-Capillary: 170 mg/dL — ABNORMAL HIGH (ref 70–99)
Glucose-Capillary: 133 mg/dL — ABNORMAL HIGH (ref 70–99)
Glucose-Capillary: 153 mg/dL — ABNORMAL HIGH (ref 70–99)

## 2019-07-13 LAB — CORTISOL-AM, BLOOD: Cortisol - AM: 27.2 ug/dL — ABNORMAL HIGH (ref 6.7–22.6)

## 2019-07-13 LAB — HEMOGLOBIN A1C
Hgb A1c MFr Bld: 5.9 % — ABNORMAL HIGH (ref 4.8–5.6)
Mean Plasma Glucose: 122.63 mg/dL

## 2019-07-13 LAB — SARS CORONAVIRUS 2 (TAT 6-24 HRS): SARS Coronavirus 2: NEGATIVE

## 2019-07-13 MED ORDER — PHENYTOIN SODIUM EXTENDED 100 MG PO CAPS
100.0000 mg | ORAL_CAPSULE | Freq: Every morning | ORAL | Status: DC
  Administered 2019-07-13: 100 mg via ORAL
  Filled 2019-07-13: qty 1

## 2019-07-13 MED ORDER — POLYETHYLENE GLYCOL 3350 17 G PO PACK
17.0000 g | PACK | Freq: Two times a day (BID) | ORAL | Status: DC
  Administered 2019-07-13 – 2019-07-16 (×3): 17 g via ORAL
  Filled 2019-07-13 (×4): qty 1

## 2019-07-13 MED ORDER — LACTATED RINGERS IV BOLUS
500.0000 mL | Freq: Once | INTRAVENOUS | Status: AC
  Administered 2019-07-13: 22:00:00 500 mL via INTRAVENOUS

## 2019-07-13 MED ORDER — ACETAMINOPHEN 650 MG RE SUPP: 650.0000 mg | Freq: Four times a day (QID) | RECTAL | Status: DC | PRN

## 2019-07-13 MED ORDER — PHENYTOIN SODIUM EXTENDED 100 MG PO CAPS: 300.0000 mg | ORAL_CAPSULE | Freq: Every evening | ORAL | Status: DC

## 2019-07-13 MED ORDER — LACTATED RINGERS IV SOLN: INTRAVENOUS | Status: DC

## 2019-07-13 MED ORDER — PHENYTOIN SODIUM EXTENDED 100 MG PO CAPS
300.0000 mg | ORAL_CAPSULE | Freq: Every evening | ORAL | Status: DC
  Administered 2019-07-13 – 2019-07-16 (×4): 300 mg via ORAL
  Filled 2019-07-13 (×5): qty 3

## 2019-07-13 MED ORDER — PHENYTOIN SODIUM EXTENDED 100 MG PO CAPS
100.0000 mg | ORAL_CAPSULE | Freq: Every morning | ORAL | Status: DC
  Administered 2019-07-14 – 2019-07-17 (×4): 100 mg via ORAL
  Filled 2019-07-13 (×5): qty 1

## 2019-07-13 MED ORDER — ACETAMINOPHEN 500 MG PO TABS
1000.0000 mg | ORAL_TABLET | Freq: Four times a day (QID) | ORAL | Status: DC | PRN
  Administered 2019-07-13 – 2019-07-14 (×2): 1000 mg via ORAL
  Filled 2019-07-13 (×2): qty 2

## 2019-07-13 NOTE — Evaluation (Signed)
Occupational Therapy Evaluation Patient Details Name: Jason Reed MRN: [id number] DOB: [birth year] Today's Date: 07/13/2019    History of Present Illness Jason Reed is a 59 y.o. year old male with solitary kidney and chronic left hydroureteronephrosis presenting to the emergency room with generalized weakness, confusion and fevers to 102.  On further evaluation, he was noted to have a positive urinalysis and blood cultures growing E. coli preliminarily. Patient found with UTI and sepsis.   Clinical Impression   Patient presents after ED visit associated with sepsis and UTI; pt has significant generalized weakness and deficits in activity tolerance.  Patient previously living at [facility] and [place] and states he was MOD I for toileting, dressing and bathing while using FWW for functional mobility.  Nursing cleared pt for evaluation this afternoon.  However, patient continues to be with fever (~103.3 degrees F).  Patient supine in bed upon entry with BP at 124/54.  Agreeable to sit at EOB.  Performed log rolling with MAX A and moved to EOB with MAX A.  Attempted to perform sit<>stand with use of RW requiring heavy MAX A and elevated bed height.  However, pt had BM and therapist assisted him back to supine with MAX A.  Required MAX A x 2 for log rolling and effective pericare.  Noted patient had resting tremors in B UEs.  BP ranged during activity from afore stated to 111/34 and 123/67 at end of session.  HR ranged from 97-105 with activity.  02 remained at 100% without supplemental 02.  Patient would benefit from continued skilled occupational therapy to address performance components outlined below.  Based on today's performance, recommending SNF for follow up care due to level of deconditioning.      Follow Up Recommendations  SNF    Equipment Recommendations  Other (comment)(defer to next level of care)    Recommendations for Other Services       Precautions /  Restrictions Precautions Precautions: Fall Restrictions Weight Bearing Restrictions: No      Mobility Bed Mobility Overal bed mobility: Needs Assistance Bed Mobility: Rolling;Supine to Sit;Sit to Supine Rolling: Max assist(Requires assist of two for appropriate pericare. x1 for assist to EOB)   Supine to sit: Max assist;HOB elevated Sit to supine: Max assist;HOB elevated   General bed mobility comments: requires cues for sequencing  Transfers Overall transfer level: Needs assistance Equipment used: Rolling walker (2 wheeled) Transfers: Sit to/from Stand Sit to Stand: Max assist         General transfer comment: Attempted sit to stand requiring heavy MAX A, unable to fully perform as pt has BM    Balance Overall balance assessment: Mild deficits observed, not formally tested                                         ADL either performed or assessed with clinical judgement   ADL Overall ADL's : Needs assistance/impaired                           Toilet Transfer Details (indicate cue type and reason): Attempted sit<>stand with heavy MAX A x 1 Toileting- Clothing Manipulation and Hygiene: Total assistance;Bed level Toileting - Clothing Manipulation Details (indicate cue type and reason): log rolling at this time due to weakness       General ADL Comments: Requires extensive assistance  due to weakness     Vision Patient Visual Report: No change from baseline       Perception     Praxis      Pertinent Vitals/Pain Pain Assessment: No/denies pain(Patient denies pain but has a fever of 103.3 degrees F)     Hand Dominance Right   Extremity/Trunk Assessment Upper Extremity Assessment Upper Extremity Assessment: Generalized weakness   Lower Extremity Assessment Lower Extremity Assessment: Generalized weakness       Communication Communication Communication: No difficulties   Cognition Arousal/Alertness: Awake/alert Behavior  During Therapy: WFL for tasks assessed/performed Overall Cognitive Status: No family/caregiver present to determine baseline cognitive functioning                                 General Comments: Patient able to communicate needs appropriately.  uncertain of validity of PLOF.   General Comments  Patient able to sit at EOB with CGA and B UEs for support, feet flat on floor    Exercises Other Exercises Other Exercises: Educated patient on role and goals of OT in acute care setting Other Exercises: Patient performed bed mobility to EOB with MAX A.  Attempted sit to stand but pt had BM.  assisted back to supine with MAx A.  Required MAX A x 2 for log rolling and TOTAL A for effective pericare   Shoulder Instructions      Home Living Family/patient expects to be discharged to:: Group home                                        Prior Functioning/Environment Level of Independence: Needs assistance        Comments: Patient reports he needed no asstance with toileting, dressing or showering and was able to walk with a walker.        OT Problem List: Decreased strength;Decreased activity tolerance;Impaired balance (sitting and/or standing)      OT Treatment/Interventions: Self-care/ADL training;Therapeutic exercise;Energy conservation;DME and/or AE instruction;Therapeutic activities;Patient/family education    OT Goals(Current goals can be found in the care plan section) Acute Rehab OT Goals Patient Stated Goal: "feel better" OT Goal Formulation: With patient Time For Goal Achievement: 07/27/19 Potential to Achieve Goals: Fair  OT Frequency: Min 1X/week   Barriers to D/C:            Co-evaluation              AM-PAC OT "6 Clicks" Daily Activity     Outcome Measure Help from another person eating meals?: A Little Help from another person taking care of personal grooming?: A Little Help from another person toileting, which includes using  toliet, bedpan, or urinal?: Total Help from another person bathing (including washing, rinsing, drying)?: A Lot Help from another person to put on and taking off regular upper body clothing?: A Lot Help from another person to put on and taking off regular lower body clothing?: Total 6 Click Score: 12   End of Session Equipment Utilized During Treatment: Gait belt Nurse Communication: Other (comment)(Discussed lab values and temperature prior to tx.  Nurse stating last temp at 99 degrees F.  During evaluation, vitals assessed and at 103.3 degrees F)  Activity Tolerance: Patient limited by lethargy;Other (comment)(Patient with continued fever) Patient left: in bed;with call bell/phone within reach;with bed alarm set  OT Visit Diagnosis:  History of falling (Z91.81);Muscle weakness (generalized) (M62.81)                Time: [id number] OT Time Calculation (min): 41 min Charges:  OT General Charges $OT Visit: 1 Visit OT Evaluation $OT Eval Moderate Complexity: 1 Mod OT Treatments $Self Care/Home Management : 8-22 mins $Therapeutic Activity: 23-37 mins  Baldomero Lamy, MS, OTR/L 07/13/19, 4:52 PM

## 2019-07-13 NOTE — Progress Notes (Signed)
Patient had a fever of 103.5. Patient assessed. Patient alert and oriented with no complaints at this time. MD notified. Acetaminophen administered. No new orders at this time. Will continue to monitor.

## 2019-07-13 NOTE — Progress Notes (Signed)
PT Cancellation Note  Patient Details Name: Jason Reed MRN: [id number] DOB: 1960/11/02   Cancelled Treatment:    Reason Eval/Treat Not Completed: Other (comment). Treatment attempted, however OT just leaving room. Pt very fatigued from session and currently febrile with tempt at 103.5 currently uptrending. Will hold at this time and re-attempt next date.   Jarrell Armond 07/13/2019, 4:37 PM  Greggory Stallion, PT, DPT [phone]

## 2019-07-13 NOTE — Consult Note (Signed)
Urology Consult  I have been asked to see the patient by Dr. Florene Glen, for evaluation and management of left hydronephrosis/UTI/sepsis of urinary source.  Chief Complaint: Fevers  History of Present Illness: Jason Reed is a 59 y.o. year old male with solitary kidney and chronic left hydroureteronephrosis presenting to the emergency room with generalized weakness, confusion and fevers to 102.  On further evaluation, he was noted to have a positive urinalysis and blood cultures growing E. coli preliminarily.  He was started on IV antibiotics and admitted yesterday for supportive care.  As part of his evaluation, he also underwent a CT abdomen pelvis with contrast which showed chronic left hydroureteronephrosis which perhaps is progressed subtly but dating back to imaging from 2018 which is her earliest cross-sectional imaging in our system.  Creatinine mildly elevated from baseline on admission, currently 1.7 (initially 1.92 ) which is near his baseline today.  He has a condom catheter in place and voiding spontaneously without difficulty.  He has no urinary complaints but is a somewhat limited historian.  He does continue to spike fevers to 102 but remains normotensive and nontachycardic.  WBC is trending down.  His lactate has normalized.  We have been involved in his care during inpatient mission 03/2019 with fairly similar presentation but in fact worse acute kidney injury at that time.  He was also seen in follow-up as an outpatient by Dr. Diamantina Providence in 05/2019 at which time he was found to have an E. coli urinary tract infection.   Past Medical History:  Diagnosis Date  . Diabetes ([place])   . HTN (hypertension)   . Hyperlipidemia   . Psychosis ([place])   . Seizure [facility])     Past Surgical History:  Procedure Laterality Date  . COLONOSCOPY WITH PROPOFOL N/A 12/22/2018   Procedure: COLONOSCOPY WITH PROPOFOL;  Surgeon: Virgel Manifold, MD;  Location: [facility] ENDOSCOPY;  Service:  Endoscopy;  Laterality: N/A;  . COLONOSCOPY WITH PROPOFOL N/A 12/23/2018   Procedure: COLONOSCOPY WITH PROPOFOL;  Surgeon: Virgel Manifold, MD;  Location: [facility] ENDOSCOPY;  Service: Endoscopy;  Laterality: N/A;  . ESOPHAGOGASTRODUODENOSCOPY (EGD) WITH PROPOFOL N/A 12/22/2018   Procedure: ESOPHAGOGASTRODUODENOSCOPY (EGD) WITH PROPOFOL;  Surgeon: Virgel Manifold, MD;  Location: [facility] ENDOSCOPY;  Service: Endoscopy;  Laterality: N/A;    Home Medications:  No outpatient medications have been marked as taking for the 07/12/19 encounter [facility] Encounter).    Allergies:  Allergies  Allergen Reactions  . Lisinopril Other (See Comments)    Unknown    Family History  Family history unknown: Yes    Social History:  reports that he has never smoked. He has never used smokeless tobacco. He reports previous drug use. He reports that he does not drink alcohol.  ROS: A complete review of systems was performed.  All systems are negative except for pertinent findings as noted albeit the patient is a limited historian.  Physical Exam:  Vital signs in last 24 hours: Temp:  [99.4 F (37.4 C)-103.1 F (39.5 C)] 99.6 F (37.6 C) (03/11 1244) Pulse Rate:  [88-116] 88 (03/11 0011) Resp:  [16-24] 18 (03/11 0011) BP: (104-161)/(60-119) 125/71 (03/11 0806) SpO2:  [93 %-99 %] 97 % (03/11 0011) Weight:  [99.8 kg] 99.8 kg (03/10 1457) Constitutional:  Alert and oriented, No acute distress.  Extremely pleasant, smiling and interactive. HEENT: [place] AT, moist mucus membranes.  Trachea midline, no masses Respiratory: Normal respiratory effort, no increased work of breathing GI: Abdomen is soft, nontender, nondistended,  no abdominal masses, or no suprapubic tenderness GU: No CVA tenderness.  Condom catheter in place draining clear yellow urine, 1200 and Foley bag. Neurologic: Grossly intact, no focal deficits, moving all 4 extremities Psychiatric: Normal mood and affect   Laboratory Data:  Recent  Labs    07/12/19 1502 07/13/19 0550  WBC 13.5* 11.4*  HGB 10.8* 10.1*  HCT 35.0* 32.1*   Recent Labs    07/12/19 1502 07/13/19 0550  NA 137 138  K 4.0 3.7  CL 99 103  CO2 24 25  GLUCOSE 202* 154*  BUN 26* 24*  CREATININE 1.92* 1.77*  CALCIUM 8.7* 8.1*   Recent Labs    07/12/19 1502 07/13/19 0550  INR 1.0 1.1   No results for input(s): LABURIN in the last 72 hours. Results for orders placed or performed during the hospital encounter of 07/12/19  Culture, blood (Routine x 2)     Status: None (Preliminary result)   Collection Time: 07/12/19  3:02 PM   Specimen: BLOOD RIGHT HAND  Result Value Ref Range Status   Specimen Description BLOOD RIGHT HAND  Final   Special Requests   Final    BOTTLES DRAWN AEROBIC AND ANAEROBIC Blood Culture adequate volume   Culture  Setup Time   Final    Organism ID to follow GRAM NEGATIVE RODS AEROBIC BOTTLE ONLY CRITICAL RESULT CALLED TO, READ BACK BY AND VERIFIED WITHRito Ehrlich AT [id number] 07/13/19 SDR Performed at [facility], [address] [place], [place] [address]    Culture GRAM NEGATIVE RODS  Final   Report Status PENDING  Incomplete  Blood Culture ID Panel (Reflexed)     Status: Abnormal   Collection Time: 07/12/19  3:02 PM  Result Value Ref Range Status   Enterococcus species NOT DETECTED NOT DETECTED Final   Listeria monocytogenes NOT DETECTED NOT DETECTED Final   Staphylococcus species NOT DETECTED NOT DETECTED Final   Staphylococcus aureus (BCID) NOT DETECTED NOT DETECTED Final   Streptococcus species NOT DETECTED NOT DETECTED Final   Streptococcus agalactiae NOT DETECTED NOT DETECTED Final   Streptococcus pneumoniae NOT DETECTED NOT DETECTED Final   Streptococcus pyogenes NOT DETECTED NOT DETECTED Final   Acinetobacter baumannii NOT DETECTED NOT DETECTED Final   Enterobacteriaceae species DETECTED (A) NOT DETECTED Final    Comment: Enterobacteriaceae represent a large family of gram-negative bacteria, not a  single organism. CRITICAL RESULT CALLED TO, READ BACK BY AND VERIFIED WITH:  Rito Ehrlich AT [id number] 07/13/19 SDR    Enterobacter cloacae complex NOT DETECTED NOT DETECTED Final   Escherichia coli DETECTED (A) NOT DETECTED Final    Comment: CRITICAL RESULT CALLED TO, READ BACK BY AND VERIFIED WITH:  Rito Ehrlich AT [id number] 07/13/19 SDR    Klebsiella oxytoca NOT DETECTED NOT DETECTED Final   Klebsiella pneumoniae NOT DETECTED NOT DETECTED Final   Proteus species NOT DETECTED NOT DETECTED Final   Serratia marcescens NOT DETECTED NOT DETECTED Final   Carbapenem resistance NOT DETECTED NOT DETECTED Final   Haemophilus influenzae NOT DETECTED NOT DETECTED Final   Neisseria meningitidis NOT DETECTED NOT DETECTED Final   Pseudomonas aeruginosa NOT DETECTED NOT DETECTED Final   Candida albicans NOT DETECTED NOT DETECTED Final   Candida glabrata NOT DETECTED NOT DETECTED Final   Candida krusei NOT DETECTED NOT DETECTED Final   Candida parapsilosis NOT DETECTED NOT DETECTED Final   Candida tropicalis NOT DETECTED NOT DETECTED Final    Comment: Performed at [facility], [place],  [place] [address]  Culture, blood (Routine x 2)     Status: None (Preliminary result)   Collection Time: 07/12/19  3:05 PM   Specimen: BLOOD RIGHT HAND  Result Value Ref Range Status   Specimen Description BLOOD RIGHT HAND  Final   Special Requests   Final    BOTTLES DRAWN AEROBIC AND ANAEROBIC Blood Culture results may not be optimal due to an inadequate volume of blood received in culture bottles   Culture  Setup Time   Final    GRAM NEGATIVE RODS ANAEROBIC BOTTLE ONLY CRITICAL VALUE NOTED.  VALUE IS CONSISTENT WITH PREVIOUSLY REPORTED AND CALLED VALUE. Performed at [facility], [address] [place], [place] [address]    Culture GRAM NEGATIVE RODS  Final   Report Status PENDING  Incomplete  SARS CORONAVIRUS 2 (TAT 6-24 HRS) Nasopharyngeal Nasopharyngeal Swab     Status: None    Collection Time: 07/12/19  7:43 PM   Specimen: Nasopharyngeal Swab  Result Value Ref Range Status   SARS Coronavirus 2 NEGATIVE NEGATIVE Final    Comment: (NOTE) SARS-CoV-2 target nucleic acids are NOT DETECTED. The SARS-CoV-2 RNA is generally detectable in upper and lower respiratory specimens during the acute phase of infection. Negative results do not preclude SARS-CoV-2 infection, do not rule out co-infections with other pathogens, and should not be used as the sole basis for treatment or other patient management decisions. Negative results must be combined with clinical observations, patient history, and epidemiological information. The expected result is Negative. Fact Sheet for Patients: SugarRoll.be Fact Sheet for Healthcare Providers: https://www.woods-mathews.com/ This test is not yet approved or cleared by the [place] FDA and  has been authorized for detection and/or diagnosis of SARS-CoV-2 by FDA under an Emergency Use Authorization (EUA). This EUA will remain  in effect (meaning this test can be used) for the duration of the COVID-19 declaration under Section 56 4(b)(1) of the Act, 21 U.S.C. section 360bbb-3(b)(1), unless the authorization is terminated or revoked sooner. Performed at [facility], [place] [address] [place] [address]      Radiologic Imaging: CT ABDOMEN PELVIS W CONTRAST  Result Date: 07/12/2019 CLINICAL DATA:  Abdominal pain, distension, and fever. Weakness for 2 days. EXAM: CT ABDOMEN AND PELVIS WITH CONTRAST TECHNIQUE: Multidetector CT imaging of the abdomen and pelvis was performed using the standard protocol following bolus administration of intravenous contrast. CONTRAST:  16mL OMNIPAQUE IOHEXOL 300 MG/ML  SOLN COMPARISON:  Noncontrast CT 03/09/2019 FINDINGS: Lower chest: Streaky atelectasis at the right lung base. No pleural fluid. Coronary artery calcifications. Hepatobiliary: Decreased  hepatic density consistent with steatosis. Subcentimeter cyst in the central right lobe of the liver. Gallbladder is decompressed and displaced anteriorly. No calcified gallstone. No pericholecystic inflammation. Pancreas: No ductal dilatation or inflammation. Spleen: Normal in size without focal abnormality. Adrenals/Urinary Tract: No adrenal nodule. Absent right kidney. There is left hydroureteronephrosis to the distal ureter in the pelvis. Hydronephrosis has slightly increased from prior exam. Similar perinephric edema. There is excretion in the left renal collecting system on delayed phase imaging. Urinary bladder is displaced anteriorly by stool distended rectum. No bladder wall thickening. Posterior bladder wall irregularity is unchanged. No visualized urolithiasis. Stomach/Bowel: Nondistended stomach. Small bowel is nondistended. Normal appendix. Large volume of stool throughout the colon with diffuse colonic tortuosity, similar in appearance to prior exam. There is sigmoid and transverse colonic redundancy. No colonic wall thickening or inflammatory change. Stool distends the rectum with rectal diameter of 8.2 cm. Associated rectal  wall thickening and perirectal edema no pneumatosis. Vascular/Lymphatic: Normal caliber abdominal aorta. The portal vein is patent. No bulky abdominopelvic adenopathy. Reproductive: Prominent prostate gland spans 5.7 cm. Other: Mild presacral edema related to rectal wall thickening. No ascites or free air. Small fat containing umbilical hernia. Mild generalized body wall edema. Musculoskeletal: There are no acute or suspicious osseous abnormalities. Degenerative change in the lumbar spine. IMPRESSION: 1. Colonic tortuosity and redundancy with large stool burden, similar to prior exam. Stool distended rectum with rectal wall thickening and perirectal edema. Findings suggest fecal impaction in the setting of chronic colonic dysmotility. 2. Left hydroureteronephrosis to the distal  ureter, also present on November 2020, slightly increased in degree from prior exam. No obstructing stone. Cause of obstruction may be stool distended rectum. There is perinephric edema, also present on prior. Recommend correlation with urinalysis. 3. Hepatic steatosis. Electronically Signed   By: Keith Rake M.D.   On: 07/12/2019 18:42   DG Chest Port 1 View  Result Date: 07/12/2019 CLINICAL DATA:  Fever, tachycardia and weakness. EXAM: PORTABLE CHEST 1 VIEW COMPARISON:  04/09/2019 FINDINGS: Poor inspiration. Borderline cardiomegaly. Aortic tortuosity. Patchy bilateral lung densities that could relate to the poor inspiration or could indicate bilateral pneumonia. No dense consolidation, lobar collapse or effusion. IMPRESSION: Poor inspiration. Patchy bilateral lung densities that could relate to poor inspiration or could indicate pneumonia. No dense consolidation or lobar collapse. Electronically Signed   By: Nelson Chimes M.D.   On: 07/12/2019 16:41    Impression/plan:  1.  Sepsis of urinary source Blood cultures growing E. coli, previously grew pansensitive E. coli 2 months ago in the office thus suspect he will likely grow the same today  2.  Left hydroureteronephrosis Chronic, possibly slightly worsened on CT yesterday but overall relatively stable Etiology of this hydronephrosis still remains unclear.  Suspect this may be related to small capacity high-pressure bladder; less likely intraluminal obstruction or extrinsic compression given the relative stability without progression.  Given that he is improving, reasonable to defer ureteral stent placement and invasive intervention at this time in lieu of more conservative approach with IV antibiotics and supportive care.   Would however recommend a Foley catheter for maximal urinary decompression of his renal unit especially in the setting of solitary kidney.  Would also consider repeating a renal ultrasound in 2-3 days to assess for  improvement/resolution of his hydroureteronephrosis for diagnostic purposes.  If his hydronephrosis resolves with a Foley catheter, most certainly related to reflux in that setting.  If patient decompensates, please contact urology immediately for source control with ureteral stent versus percutaneous nephrostomy tube.  Agree with Dr. Doristine Counter recommendation of outpatient cystoscopic eval  3.  Acute on chronic kidney disease Creatinine improving with supportive care  4.  Chronic constipation/fecal impaction Recommend aggressive bowel regimen as this may be confounding factor for recurrent urinary tract infections incomplete bladder emptying, etc.  07/13/2019, 2:52 PM   Thank you for involving me in this patient's care, we will continue to follow along. Please page/ secure chate with any further questions or concerns.  Hollice Espy

## 2019-07-13 NOTE — Progress Notes (Signed)
PHARMACY - PHYSICIAN COMMUNICATION CRITICAL VALUE ALERT - BLOOD CULTURE IDENTIFICATION (BCID)  Jason Reed is an 59 y.o. male who presented to [facility] on 07/12/2019 with a chief complaint of weakness  Assessment: BCID positive for GNR's, E Coli, 2/4 bottles aerobic only, KPC not  UA is positive for a UTI as the source of sepsis.  Name of physician (or Provider) Contacted: A Fayrene Helper  Current antibiotics: Ceftriaxone 2g q24h  Changes to prescribed antibiotics recommended:  None recommended at this time - pending review of ID [facility]  Results for orders placed or performed during the hospital encounter of 07/12/19  Blood Culture ID Panel (Reflexed) (Collected: 07/12/2019  3:02 PM)  Result Value Ref Range   Enterococcus species NOT DETECTED NOT DETECTED   Listeria monocytogenes NOT DETECTED NOT DETECTED   Staphylococcus species NOT DETECTED NOT DETECTED   Staphylococcus aureus (BCID) NOT DETECTED NOT DETECTED   Streptococcus species NOT DETECTED NOT DETECTED   Streptococcus agalactiae NOT DETECTED NOT DETECTED   Streptococcus pneumoniae NOT DETECTED NOT DETECTED   Streptococcus pyogenes NOT DETECTED NOT DETECTED   Acinetobacter baumannii NOT DETECTED NOT DETECTED   Enterobacteriaceae species DETECTED (A) NOT DETECTED   Enterobacter cloacae complex NOT DETECTED NOT DETECTED   Escherichia coli DETECTED (A) NOT DETECTED   Klebsiella oxytoca NOT DETECTED NOT DETECTED   Klebsiella pneumoniae NOT DETECTED NOT DETECTED   Proteus species NOT DETECTED NOT DETECTED   Serratia marcescens NOT DETECTED NOT DETECTED   Carbapenem resistance NOT DETECTED NOT DETECTED   Haemophilus influenzae NOT DETECTED NOT DETECTED   Neisseria meningitidis NOT DETECTED NOT DETECTED   Pseudomonas aeruginosa NOT DETECTED NOT DETECTED   Candida albicans NOT DETECTED NOT DETECTED   Candida glabrata NOT DETECTED NOT DETECTED   Candida krusei NOT DETECTED NOT DETECTED   Candida parapsilosis NOT DETECTED NOT  DETECTED   Candida tropicalis NOT DETECTED NOT [place], PharmD, BCPS Clinical Pharmacist 07/13/2019 8:55 AM

## 2019-07-13 NOTE — ED Notes (Signed)
Offered to call family for pt, pt declined.  

## 2019-07-13 NOTE — Progress Notes (Signed)
PROGRESS NOTE    Jason Reed  [id number] DOB: [birth year] DOA: 07/12/2019 PCP: Jason Merles, MD   Brief Narrative:  Jason Reed  is Jason Reed 59 y.o. African-American male with Jason Reed known history of hypertension, type diabetes mellitus, dyslipidemia, seizure disorder and psychosis, who presented to the emergency room, who presented to the emergency room with acute onset of generalized weakness over the last couple of days with associated altered mental status with confusion.  He admitted to urinary frequency without dysuria, oliguria or hematuria or flank pain.  He admitted to having fever and cold chills.  He denies any chest pain or dyspnea or cough.  No rhinorrhea or nasal congestion or sore throat.  No nausea or vomiting or abdominal pain.  He's been admitted for treatment of sepsis 2/2 e. Coli bacteremia in setting of UTI.  Assessment & Plan:   Active Problems:   Sepsis ([place])  1.  Sepsis   E. Coli Bacteremia 2/2 Urinary Tract Infection - Continue ceftriaxone 2 g daily - urine culture pending - blood culture gram negative rods with BCID showing e. Coli - CT abd/pelvis showed L hydroureteronephrosis, which appears chronic to some degree, in setting of infection, will discuss with urology - Significantly elevated procalcitonin, CXR with poor inspiration, patchy bilateral lung densities 2/2 poor inspiration vs pneumonia (in setting of above, suspect poor inspiration) - Continue IVF  2.  Acute kidney injury. - baseline renal function 1.27 in 04/2019 - on presentation 1.92 - follow with IVF, improving today  # Generalized Weakness   Acute Metabolic Encephalopathy: 2/2 sepsis above, follow with treatment with IV abx - PT/OT  4.  Type 2 diabetes mellitus. - A1c is 5.9 - SSI, metformin on hold  5.  Psychotic disorder. - Continue home klonopin - abilify and seroquel currently on hold with his encephalopathy on presentation, follow for resumption  6.  Seizure disorder. -We  will continue Dilantin and check its level (pending).  7.  Dyslipidemia. -We will continue statin therapy.  8.  BPH. -We will continue Flomax.  # Constipation: imaging suggestive of fecal impaction in setting of chronic colonic dysmotility.  Per nursing, pt had large BM this AM.  No significant impaction noted after this on exam per nursing.  Continue bowel regimen.  # Thrombocytopenia: will continue to monitor  DVT prophylaxis: lovenox Code Status: full Family Communication: none at bedside Disposition Plan:   Patient came from: home             Anticipated d/c place: home  Barriers to d/c OR conditions which need to be met to effect Jason Reed safe d/c: pending sensitivities for e. Coli bacteremia, further improvement from sepsis   Consultants:   none  Procedures:   none  Antimicrobials: Anti-infectives (From admission, onward)   Start     Dose/Rate Route Frequency Ordered Stop   07/13/19 0100  cefTRIAXone (ROCEPHIN) 2 g in sodium chloride 0.9 % 100 mL IVPB     2 g 200 mL/hr over 30 Minutes Intravenous Every 24 hours 07/12/19 2216     07/12/19 1615  ceFEPIme (MAXIPIME) 2 g in sodium chloride 0.9 % 100 mL IVPB     2 g 200 mL/hr over 30 Minutes Intravenous  Once 07/12/19 1612 07/12/19 1824   07/12/19 1615  metroNIDAZOLE (FLAGYL) IVPB 500 mg     500 mg 100 mL/hr over 60 Minutes Intravenous  Once 07/12/19 1612 07/12/19 1942   07/12/19 1615  vancomycin (VANCOCIN) IVPB 1000 mg/200 mL premix  1,000 mg 200 mL/hr over 60 Minutes Intravenous  Once 07/12/19 1612 07/12/19 1941      Subjective: Jason Reed&Ox2-3 Presented due to weakness, fall in clinic  Objective: Vitals:   07/12/19 2330 07/13/19 0011 07/13/19 0806 07/13/19 1244  BP: 104/61 120/61 125/71   Pulse: 93 88    Resp: 18 18    Temp:  99.4 F (37.4 C) (!) 102.5 F (39.2 C) 99.6 F (37.6 C)  TempSrc:  Oral Oral Oral  SpO2: 97% 97%    Weight:      Height:        Intake/Output Summary (Last 24 hours) at 07/13/2019  1313 Last data filed at 07/13/2019 0955 Gross per 24 hour  Intake 2198.33 ml  Output 1100 ml  Net 1098.33 ml   Filed Weights   07/12/19 1457  Weight: 99.8 kg    Examination:  General exam: Appears calm and comfortable  Respiratory system: Clear to auscultation. Respiratory effort normal. Cardiovascular system: S1 & S2 heard, RRR Gastrointestinal system: Abdomen is nondistended, soft and nontender. Central nervous system: Alert and oriented x2-3. No focal neurological deficits. Extremities: Symmetric 5 x 5 power. Skin: No rashes, lesions or ulcers Psychiatry: Judgement and insight appear normal. Mood & affect appropriate.     Data Reviewed: I have personally reviewed following labs and imaging studies  CBC: Recent Labs  Lab 07/12/19 1502 07/13/19 0550  WBC 13.5* 11.4*  NEUTROABS 11.2*  --   HGB 10.8* 10.1*  HCT 35.0* 32.1*  MCV 93.3 92.0  PLT 125* 793*   Basic Metabolic Panel: Recent Labs  Lab 07/12/19 1502 07/13/19 0550  NA 137 138  K 4.0 3.7  CL 99 103  CO2 24 25  GLUCOSE 202* 154*  BUN 26* 24*  CREATININE 1.92* 1.77*  CALCIUM 8.7* 8.1*   GFR: Estimated Creatinine Clearance: 54.3 mL/min (Jason Reed) (by C-G formula based on SCr of 1.77 mg/dL (H)). Liver Function Tests: Recent Labs  Lab 07/12/19 1502  AST 25  ALT 16  ALKPHOS 88  BILITOT 1.1  PROT 7.7  ALBUMIN 3.6   No results for input(s): LIPASE, AMYLASE in the last 168 hours. No results for input(s): AMMONIA in the last 168 hours. Coagulation Profile: Recent Labs  Lab 07/12/19 1502 07/13/19 0550  INR 1.0 1.1   Cardiac Enzymes: No results for input(s): CKTOTAL, CKMB, CKMBINDEX, TROPONINI in the last 168 hours. BNP (last 3 results) No results for input(s): PROBNP in the last 8760 hours. HbA1C: Recent Labs    07/13/19 0550  HGBA1C 5.9*   CBG: Recent Labs  Lab 07/13/19 0003 07/13/19 0811 07/13/19 1150  GLUCAP 186* 126* 170*   Lipid Profile: No results for input(s): CHOL, HDL, LDLCALC,  TRIG, CHOLHDL, LDLDIRECT in the last 72 hours. Thyroid Function Tests: No results for input(s): TSH, T4TOTAL, FREET4, T3FREE, THYROIDAB in the last 72 hours. Anemia Panel: No results for input(s): VITAMINB12, FOLATE, FERRITIN, TIBC, IRON, RETICCTPCT in the last 72 hours. Sepsis Labs: Recent Labs  Lab 07/12/19 1502 07/12/19 1818 07/13/19 0550  PROCALCITON  --   --  11.73  LATICACIDVEN 2.3* 1.6  --     Recent Results (from the past 240 hour(s))  Culture, blood (Routine x 2)     Status: None (Preliminary result)   Collection Time: 07/12/19  3:02 PM   Specimen: BLOOD RIGHT HAND  Result Value Ref Range Status   Specimen Description BLOOD RIGHT HAND  Final   Special Requests   Final    BOTTLES DRAWN AEROBIC  AND ANAEROBIC Blood Culture adequate volume   Culture  Setup Time   Final    Organism ID to follow GRAM NEGATIVE RODS AEROBIC BOTTLE ONLY CRITICAL RESULT CALLED TO, READ BACK BY AND VERIFIED WITH: Rito Ehrlich AT 5397 07/13/19 [place] Performed at [facility], [address] [place], [place] [address]    Culture GRAM NEGATIVE RODS  Final   Report Status PENDING  Incomplete  Blood Culture ID Panel (Reflexed)     Status: Abnormal   Collection Time: 07/12/19  3:02 PM  Result Value Ref Range Status   Enterococcus species NOT DETECTED NOT DETECTED Final   Listeria monocytogenes NOT DETECTED NOT DETECTED Final   Staphylococcus species NOT DETECTED NOT DETECTED Final   Staphylococcus aureus (BCID) NOT DETECTED NOT DETECTED Final   Streptococcus species NOT DETECTED NOT DETECTED Final   Streptococcus agalactiae NOT DETECTED NOT DETECTED Final   Streptococcus pneumoniae NOT DETECTED NOT DETECTED Final   Streptococcus pyogenes NOT DETECTED NOT DETECTED Final   Acinetobacter baumannii NOT DETECTED NOT DETECTED Final   Enterobacteriaceae species DETECTED (Rayleen Wyrick) NOT DETECTED Final    Comment: Enterobacteriaceae represent Rache Klimaszewski large family of gram-negative bacteria, not Mehul Rudin single  organism. CRITICAL RESULT CALLED TO, READ BACK BY AND VERIFIED WITH:  Rito Ehrlich AT 9379 07/13/19 SDR    Enterobacter cloacae complex NOT DETECTED NOT DETECTED Final   Escherichia coli DETECTED (Maebelle Sulton) NOT DETECTED Final    Comment: CRITICAL RESULT CALLED TO, READ BACK BY AND VERIFIED WITH:  Rito Ehrlich AT 0240 07/13/19 SDR    Klebsiella oxytoca NOT DETECTED NOT DETECTED Final   Klebsiella pneumoniae NOT DETECTED NOT DETECTED Final   Proteus species NOT DETECTED NOT DETECTED Final   Serratia marcescens NOT DETECTED NOT DETECTED Final   Carbapenem resistance NOT DETECTED NOT DETECTED Final   Haemophilus influenzae NOT DETECTED NOT DETECTED Final   Neisseria meningitidis NOT DETECTED NOT DETECTED Final   Pseudomonas aeruginosa NOT DETECTED NOT DETECTED Final   Candida albicans NOT DETECTED NOT DETECTED Final   Candida glabrata NOT DETECTED NOT DETECTED Final   Candida krusei NOT DETECTED NOT DETECTED Final   Candida parapsilosis NOT DETECTED NOT DETECTED Final   Candida tropicalis NOT DETECTED NOT DETECTED Final    Comment: Performed at [facility], [address] [place], [place] [address]  Culture, blood (Routine x 2)     Status: None (Preliminary result)   Collection Time: 07/12/19  3:05 PM   Specimen: BLOOD RIGHT HAND  Result Value Ref Range Status   Specimen Description BLOOD RIGHT HAND  Final   Special Requests   Final    BOTTLES DRAWN AEROBIC AND ANAEROBIC Blood Culture results may not be optimal due to an inadequate volume of blood received in culture bottles   Culture   Final    NO GROWTH < 24 HOURS Performed at [facility], [address] [place], [place] [address]    Report Status PENDING  Incomplete  SARS CORONAVIRUS 2 (TAT 6-24 HRS) Nasopharyngeal Nasopharyngeal Swab     Status: None   Collection Time: 07/12/19  7:43 PM   Specimen: Nasopharyngeal Swab  Result Value Ref Range Status   SARS Coronavirus 2 NEGATIVE NEGATIVE Final    Comment:  (NOTE) SARS-CoV-2 target nucleic acids are NOT DETECTED. The SARS-CoV-2 RNA is generally detectable in upper and lower respiratory specimens during the acute phase of infection. Negative results do not preclude SARS-CoV-2 infection, do not rule out co-infections with other pathogens, and should not  be used as the sole basis for treatment or other patient management decisions. Negative results must be combined with clinical observations, patient history, and epidemiological information. The expected result is Negative. Fact Sheet for Patients: SugarRoll.be Fact Sheet for Healthcare Providers: https://www.woods-mathews.com/ This test is not yet approved or cleared by the [place] FDA and  has been authorized for detection and/or diagnosis of SARS-CoV-2 by FDA under an Emergency Use Authorization (EUA). This EUA will remain  in effect (meaning this test can be used) for the duration of the COVID-19 declaration under Section 56 4(b)(1) of the Act, 21 U.S.C. section 360bbb-3(b)(1), unless the authorization is terminated or revoked sooner. Performed at [facility], [place] [address] [place], [place] [address]          Radiology Studies: CT ABDOMEN PELVIS W CONTRAST  Result Date: 07/12/2019 CLINICAL DATA:  Abdominal pain, distension, and fever. Weakness for 2 days. EXAM: CT ABDOMEN AND PELVIS WITH CONTRAST TECHNIQUE: Multidetector CT imaging of the abdomen and pelvis was performed using the standard protocol following bolus administration of intravenous contrast. CONTRAST:  63m OMNIPAQUE IOHEXOL 300 MG/ML  SOLN COMPARISON:  Noncontrast CT 03/09/2019 FINDINGS: Lower chest: Streaky atelectasis at the right lung base. No pleural fluid. Coronary artery calcifications. Hepatobiliary: Decreased hepatic density consistent with steatosis. Subcentimeter cyst in the central right lobe of the liver. Gallbladder is decompressed and displaced  anteriorly. No calcified gallstone. No pericholecystic inflammation. Pancreas: No ductal dilatation or inflammation. Spleen: Normal in size without focal abnormality. Adrenals/Urinary Tract: No adrenal nodule. Absent right kidney. There is left hydroureteronephrosis to the distal ureter in the pelvis. Hydronephrosis has slightly increased from prior exam. Similar perinephric edema. There is excretion in the left renal collecting system on delayed phase imaging. Urinary bladder is displaced anteriorly by stool distended rectum. No bladder wall thickening. Posterior bladder wall irregularity is unchanged. No visualized urolithiasis. Stomach/Bowel: Nondistended stomach. Small bowel is nondistended. Normal appendix. Large volume of stool throughout the colon with diffuse colonic tortuosity, similar in appearance to prior exam. There is sigmoid and transverse colonic redundancy. No colonic wall thickening or inflammatory change. Stool distends the rectum with rectal diameter of 8.2 cm. Associated rectal wall thickening and perirectal edema no pneumatosis. Vascular/Lymphatic: Normal caliber abdominal aorta. The portal vein is patent. No bulky abdominopelvic adenopathy. Reproductive: Prominent prostate gland spans 5.7 cm. Other: Mild presacral edema related to rectal wall thickening. No ascites or free air. Small fat containing umbilical hernia. Mild generalized body wall edema. Musculoskeletal: There are no acute or suspicious osseous abnormalities. Degenerative change in the lumbar spine. IMPRESSION: 1. Colonic tortuosity and redundancy with large stool burden, similar to prior exam. Stool distended rectum with rectal wall thickening and perirectal edema. Findings suggest fecal impaction in the setting of chronic colonic dysmotility. 2. Left hydroureteronephrosis to the distal ureter, also present on November 2020, slightly increased in degree from prior exam. No obstructing stone. Cause of obstruction may be stool  distended rectum. There is perinephric edema, also present on prior. Recommend correlation with urinalysis. 3. Hepatic steatosis. Electronically Signed   By: MKeith RakeM.D.   On: 07/12/2019 18:42   DG Chest Port 1 View  Result Date: 07/12/2019 CLINICAL DATA:  Fever, tachycardia and weakness. EXAM: PORTABLE CHEST 1 VIEW COMPARISON:  04/09/2019 FINDINGS: Poor inspiration. Borderline cardiomegaly. Aortic tortuosity. Patchy bilateral lung densities that could relate to the poor inspiration or could indicate bilateral pneumonia. No dense consolidation, lobar collapse or effusion. IMPRESSION: Poor inspiration. Patchy bilateral lung densities  that could relate to poor inspiration or could indicate pneumonia. No dense consolidation or lobar collapse. Electronically Signed   By: Nelson Chimes M.D.   On: 07/12/2019 16:41        Scheduled Meds:  aspirin EC  81 mg Oral Daily   clonazePAM  1 mg Oral QHS   enoxaparin (LOVENOX) injection  40 mg Subcutaneous Q24H   gabapentin  300 mg Oral TID   insulin aspart  0-9 Units Subcutaneous TID PC & HS   loratadine  10 mg Oral Daily   mirtazapine  15 mg Oral QHS   [START ON 07/14/2019] phenytoin  100 mg Oral q AM   And   phenytoin  300 mg Oral QPM   polyethylene glycol  17 g Oral BID   psyllium  1 packet Oral Daily   simvastatin  20 mg Oral QHS   tamsulosin  0.4 mg Oral Daily   Continuous Infusions:  sodium chloride 100 mL/hr at 07/13/19 0201   cefTRIAXone (ROCEPHIN)  IV 2 g (07/13/19 0201)     LOS: 1 day   Time spent: over 30 min    Fayrene Helper, MD [facility]   To contact the attending provider between 7A-7P or the covering provider during after hours 7P-7A, please log into the web site [email or web address] and access using universal [place] password for that web site. If you do not have the password, please call the hospital operator.  07/13/2019, 1:13 PM

## 2019-07-13 NOTE — Progress Notes (Signed)
Spoke with group home caregiver, Tawana Scale in order to complete profile d/t pt inability to answer questions. Caregiver endorses pt does have history of MR at baseline. Prior to "not feeling well" he was independent to ADL, however has required assistance the past few days.

## 2019-07-13 NOTE — TOC Initial Note (Signed)
Transition of Care [facility]) - Initial/Assessment Note    Patient Details  Name: Jason Reed MRN: [id number] Date of Birth: 07-29-1960  Transition of Care [facility]) CM/SW Contact:    Shelbie Hutching, RN Phone Number: 07/13/2019, 4:25 PM  Clinical Narrative:                 Patient admitted with sepsis from urinary tract infection.  Patient is from [place] and [facility].  Clara Vicente Males is the contact for the group home.  PT evaluation pending.  Patient has been to [facility] in the past for short term skilled nursing.    Expected Discharge Plan: Group Home Barriers to Discharge: Continued Medical Work up   Patient Goals and CMS Choice        Expected Discharge Plan and Services Expected Discharge Plan: Group Home   Discharge Planning Services: CM Consult   Living arrangements for the past 2 months: Group Home                                      Prior Living Arrangements/Services Living arrangements for the past 2 months: Group Home Lives with:: Facility Resident Patient language and need for interpreter reviewed:: Yes Do you feel safe going back to the place where you live?: Yes      Need for Family Participation in Patient Care: Yes (Comment) Care giver support system in place?: Yes (comment)(group home)   Criminal Activity/Legal Involvement Pertinent to Current Situation/Hospitalization: Yes - Comment as needed  Activities of Daily Living Home Assistive Devices/Equipment: None ADL Screening (condition at time of admission) Patient's cognitive ability adequate to safely complete daily activities?: No Is the patient deaf or have difficulty hearing?: No Does the patient have difficulty seeing, even when wearing glasses/contacts?: No Does the patient have difficulty concentrating, remembering, or making decisions?: Yes Patient able to express need for assistance with ADLs?: Yes Does the patient have difficulty dressing or bathing?: Yes(For  a few days "sinces he's been feeling bad" per care giver) Independently performs ADLs?: No(For a few days "sinces he's been feeling bad" per care giver) Communication: Independent Dressing (OT): Needs assistance(For a few days "sinces he's been feeling bad" per care giver) Is this a change from baseline?: Change from baseline, expected to last <3days Grooming: Needs assistance Is this a change from baseline?: Change from baseline, expected to last <3 days Bathing: Needs assistance Is this a change from baseline?: Change from baseline, expected to last <3 days Toileting: Needs assistance Is this a change from baseline?: Change from baseline, expected to last <3 days In/Out Bed: Independent Walks in Home: Independent Does the patient have difficulty walking or climbing stairs?: No Weakness of Legs: Both(For a few days "sinces he's been feeling bad" per care giver) Weakness of Arms/Hands: Both(For a few days "sinces he's been feeling bad" per care giver)  Permission Sought/Granted Permission sought to share information with : Case Manager, Customer service manager Permission granted to share information with : Yes, Verbal Permission Granted     Permission granted to share info w AGENCY: [facility]        Emotional Assessment Appearance:: Appears stated age     Orientation: : Oriented to Self, Oriented to Place, Oriented to  Time, Oriented to Situation Alcohol / Substance Use: Not Applicable Psych Involvement: No (comment)  Admission diagnosis:  Seizure disorder ([place]) [G40.909] Generalized weakness [  R53.1] Sepsis ([place]) [A41.9] Type 2 diabetes mellitus without complication, without long-term current use of insulin ([place]) [E11.9] Sepsis, due to unspecified organism, unspecified whether acute organ dysfunction present [facility]) [A41.9] Patient Active Problem List   Diagnosis Date Noted  . Lactic acid acidosis 04/10/2019  . Left leg weakness 04/09/2019  . CKD (chronic  kidney disease), stage III 04/09/2019  . Hypokalemia 04/09/2019  . Anemia due to chronic kidney disease 04/09/2019  . Essential hypertension 04/09/2019  . Hyperlipidemia   . Sepsis ([place])   . Acute lower UTI   . Diabetes mellitus due to underlying condition with stage 2 chronic kidney disease, with long-term current use of insulin ([place])   . AKI (acute kidney injury) ([place])   . Obesity (BMI 30-39.9)   . Psychosis ([place])   . Seizure disorder ([place])   . Rectal polyp   . Ectopic gastric tissue   . Stomach irritation   . Loss of weight   . Colon cancer screening   . Edema of colon   . Septic shock ([place]) 01/08/2017   PCP:  Marguerita Merles, MD Pharmacy:   [facility], [address] [place] [address] Phone: [phone] Fax: [phone]     Social Determinants of Health (SDOH) Interventions    Readmission Risk Interventions No flowsheet data found.

## 2019-07-13 NOTE — Progress Notes (Signed)
Patient had a temperature of 102.5. Patient assessed. Patient alert and oriented with no complaints at this time. MD notified. Acetaminophen administered. No new orders at this time. Will continue to monitor.

## 2019-07-14 ENCOUNTER — Inpatient Hospital Stay: Payer: Medicare Other

## 2019-07-14 LAB — CBC WITH DIFFERENTIAL/PLATELET
Abs Immature Granulocytes: 0.05 10*3/uL (ref 0.00–0.07)
Basophils Absolute: 0.1 10*3/uL (ref 0.0–0.1)
Basophils Relative: 1 %
Eosinophils Absolute: 0.1 10*3/uL (ref 0.0–0.5)
Eosinophils Relative: 1 %
HCT: 31.5 % — ABNORMAL LOW (ref 39.0–52.0)
Hemoglobin: 9.9 g/dL — ABNORMAL LOW (ref 13.0–17.0)
Immature Granulocytes: 1 %
Lymphocytes Relative: 9 %
Lymphs Abs: 0.9 10*3/uL (ref 0.7–4.0)
MCH: 28.4 pg (ref 26.0–34.0)
MCHC: 31.4 g/dL (ref 30.0–36.0)
MCV: 90.3 fL (ref 80.0–100.0)
Monocytes Absolute: 1.6 10*3/uL — ABNORMAL HIGH (ref 0.1–1.0)
Monocytes Relative: 17 %
Neutro Abs: 6.7 10*3/uL (ref 1.7–7.7)
Neutrophils Relative %: 71 %
Platelets: 130 10*3/uL — ABNORMAL LOW (ref 150–400)
RBC: 3.49 MIL/uL — ABNORMAL LOW (ref 4.22–5.81)
RDW: 13.9 % (ref 11.5–15.5)
WBC: 9.2 10*3/uL (ref 4.0–10.5)
nRBC: 0 % (ref 0.0–0.2)

## 2019-07-14 LAB — GLUCOSE, CAPILLARY
Glucose-Capillary: 116 mg/dL — ABNORMAL HIGH (ref 70–99)
Glucose-Capillary: 140 mg/dL — ABNORMAL HIGH (ref 70–99)
Glucose-Capillary: 154 mg/dL — ABNORMAL HIGH (ref 70–99)
Glucose-Capillary: 140 mg/dL — ABNORMAL HIGH (ref 70–99)

## 2019-07-14 LAB — COMPREHENSIVE METABOLIC PANEL
ALT: 15 U/L (ref 0–44)
AST: 21 U/L (ref 15–41)
Albumin: 2.8 g/dL — ABNORMAL LOW (ref 3.5–5.0)
Alkaline Phosphatase: 69 U/L (ref 38–126)
Anion gap: 9 (ref 5–15)
BUN: 20 mg/dL (ref 6–20)
CO2: 24 mmol/L (ref 22–32)
Calcium: 8 mg/dL — ABNORMAL LOW (ref 8.9–10.3)
Chloride: 104 mmol/L (ref 98–111)
Creatinine, Ser: 1.83 mg/dL — ABNORMAL HIGH (ref 0.61–1.24)
GFR calc Af Amer: 45 mL/min — ABNORMAL LOW (ref >60)
GFR calc non Af Amer: 39 mL/min — ABNORMAL LOW (ref >60)
Glucose, Bld: 122 mg/dL — ABNORMAL HIGH (ref 70–99)
Potassium: 3.2 mmol/L — ABNORMAL LOW (ref 3.5–5.1)
Sodium: 137 mmol/L (ref 135–145)
Total Bilirubin: 1.1 mg/dL (ref 0.3–1.2)
Total Protein: 6.5 g/dL (ref 6.5–8.1)

## 2019-07-14 LAB — MAGNESIUM: Magnesium: 2 mg/dL (ref 1.7–2.4)

## 2019-07-14 LAB — PHOSPHORUS: Phosphorus: 2.9 mg/dL (ref 2.5–4.6)

## 2019-07-14 MED ORDER — CHLORHEXIDINE GLUCONATE CLOTH 2 % EX PADS
6.0000 | MEDICATED_PAD | Freq: Every day | CUTANEOUS | Status: DC
  Administered 2019-07-14 – 2019-07-17 (×4): 6 via TOPICAL

## 2019-07-14 MED ORDER — POTASSIUM CHLORIDE CRYS ER 20 MEQ PO TBCR
40.0000 meq | EXTENDED_RELEASE_TABLET | Freq: Once | ORAL | Status: AC
  Administered 2019-07-14: 40 meq via ORAL
  Filled 2019-07-14: qty 2

## 2019-07-14 MED ORDER — SENNOSIDES-DOCUSATE SODIUM 8.6-50 MG PO TABS
2.0000 | ORAL_TABLET | Freq: Every day | ORAL | Status: DC
  Administered 2019-07-14 – 2019-07-15 (×2): 2 via ORAL
  Filled 2019-07-14 (×2): qty 2

## 2019-07-14 NOTE — Progress Notes (Signed)
Urology Inpatient Progress Note  Subjective: Jason Reed is a 59 y.o. male with solitary left kidney and chronic left hydroureteronephrosis admitted on 07/12/2019 with urosepsis, slightly increased left hydronephrosis, and elevated creatinine.  CT also notable for chronic constipation/fecal impaction, possibly contributory.  WBC count, lactate, and creatinine improving on antibiotics.  Given these findings, we elected to manage him conservatively and defer stent placement versus left PCN placement pending clinical worsening.  Foley catheter was placed yesterday for urinary decompression with suspicion of high pressure bladder and plans for renal ultrasound for hydronephrosis reassessment over the weekend.  Urine culture resulted with multiple species present, recollection suggested.  On antibiotics as below.  Creatinine slightly up today, 1.83.  WBC count down today, 9.2.  He has been febrile overnight, responsive to antipyretics.  He was hypotensive to 105/23 overnight, improved this morning.  Patient is eating breakfast on interview this morning.  He states he is not feeling any better and denies pain, but he is unable to elaborate further.  Foley catheter in place draining clear, yellow urine.  Anti-infectives: Anti-infectives (From admission, onward)   Start     Dose/Rate Route Frequency Ordered Stop   07/13/19 0100  cefTRIAXone (ROCEPHIN) 2 g in sodium chloride 0.9 % 100 mL IVPB     2 g 200 mL/hr over 30 Minutes Intravenous Every 24 hours 07/12/19 2216     07/12/19 1615  ceFEPIme (MAXIPIME) 2 g in sodium chloride 0.9 % 100 mL IVPB     2 g 200 mL/hr over 30 Minutes Intravenous  Once 07/12/19 1612 07/12/19 1824   07/12/19 1615  metroNIDAZOLE (FLAGYL) IVPB 500 mg     500 mg 100 mL/hr over 60 Minutes Intravenous  Once 07/12/19 1612 07/12/19 1942   07/12/19 1615  vancomycin (VANCOCIN) IVPB 1000 mg/200 mL premix     1,000 mg 200 mL/hr over 60 Minutes Intravenous  Once 07/12/19 1612  07/12/19 1941      Current Facility-Administered Medications  Medication Dose Route Frequency Provider Last Rate Last Admin  . acetaminophen (TYLENOL) tablet 1,000 mg  1,000 mg Oral Q6H PRN Sharion Settler, NP   1,000 mg at 07/13/19 2233   Or  . acetaminophen (TYLENOL) suppository 650 mg  650 mg Rectal Q6H PRN Sharion Settler, NP      . aspirin EC tablet 81 mg  81 mg Oral Daily Mansy, Jan A, MD   81 mg at 07/13/19 1005  . cefTRIAXone (ROCEPHIN) 2 g in sodium chloride 0.9 % 100 mL IVPB  2 g Intravenous Q24H Mansy, Arvella Merles, MD   Stopped at 07/14/19 0123  . Chlorhexidine Gluconate Cloth 2 % PADS 6 each  6 each Topical Daily Elodia Florence., MD      . clonazePAM [facility]) tablet 1 mg  1 mg Oral QHS Mansy, Jan A, MD   1 mg at 07/13/19 2140  . enoxaparin (LOVENOX) injection 40 mg  40 mg Subcutaneous Q24H Mansy, Jan A, MD   40 mg at 07/13/19 2136  . gabapentin (NEURONTIN) tablet 300 mg  300 mg Oral TID Mansy, Jan A, MD   300 mg at 07/13/19 2140  . insulin aspart (novoLOG) injection 0-9 Units  0-9 Units Subcutaneous TID PC & HS Mansy, Arvella Merles, MD   2 Units at 07/13/19 2140  . lactated ringers infusion   Intravenous Continuous Sharion Settler, NP 125 mL/hr at 07/14/19 0603 New Bag at 07/14/19 0603  . loratadine (CLARITIN) tablet 10 mg  10 mg Oral Daily  Mansy, Jan A, MD   10 mg at 07/13/19 1006  . magnesium hydroxide (MILK OF MAGNESIA) suspension 30 mL  30 mL Oral Daily PRN Mansy, Jan A, MD      . mirtazapine (REMERON) tablet 15 mg  15 mg Oral QHS Mansy, Jan A, MD   15 mg at 07/13/19 2140  . ondansetron (ZOFRAN) tablet 4 mg  4 mg Oral Q6H PRN Mansy, Jan A, MD       Or  . ondansetron [facility]) injection 4 mg  4 mg Intravenous Q6H PRN Mansy, Jan A, MD      . phenytoin (DILANTIN) ER capsule 100 mg  100 mg Oral q AM Shanlever, Pierce Crane, RPH   100 mg at 07/14/19 0559   And  . phenytoin (DILANTIN) ER capsule 300 mg  300 mg Oral QPM Lu Duffel, RPH   300 mg at 07/13/19 2137  . polyethylene  glycol (MIRALAX / GLYCOLAX) packet 17 g  17 g Oral BID Elodia Florence., MD   17 g at 07/13/19 1012  . psyllium (HYDROCIL/METAMUCIL) packet 1 packet  1 packet Oral Daily Mansy, Jan A, MD      . simvastatin (ZOCOR) tablet 20 mg  20 mg Oral QHS Mansy, Jan A, MD   20 mg at 07/13/19 2140  . tamsulosin (FLOMAX) capsule 0.4 mg  0.4 mg Oral Daily Mansy, Jan A, MD   0.4 mg at 07/13/19 1005  . traZODone (DESYREL) tablet 25 mg  25 mg Oral QHS PRN Mansy, Arvella Merles, MD       Objective: Vital signs in last 24 hours: Temp:  [98.5 F (36.9 C)-103.5 F (39.7 C)] 100.3 F (37.9 C) (03/12 0750) Pulse Rate:  [86-102] 86 (03/12 0559) Resp:  [19-26] 19 (03/12 0559) BP: (105-135)/(23-67) 128/44 (03/12 0750) SpO2:  [76 %-100 %] 99 % (03/12 0559)  Intake/Output from previous day: 03/11 0701 - 03/12 0700 In: 1100 [I.V.:1000; IV Piggyback:100] Out: 3700 [Urine:3700] Intake/Output this shift: No intake/output data recorded.  Physical Exam Vitals and nursing note reviewed.  HENT:     Head: Normocephalic and atraumatic.  Pulmonary:     Effort: Pulmonary effort is normal. No respiratory distress.  Abdominal:     Tenderness: There is no abdominal tenderness (suprapubic). There is no left CVA tenderness.  Skin:    General: Skin is warm and dry.  Neurological:     Mental Status: He is alert. Mental status is at baseline.  Psychiatric:        Behavior: Behavior is cooperative.    Lab Results:  Recent Labs    07/13/19 0550 07/14/19 0548  WBC 11.4* 9.2  HGB 10.1* 9.9*  HCT 32.1* 31.5*  PLT 113* 130*   BMET Recent Labs    07/13/19 0550 07/14/19 0548  NA 138 137  K 3.7 3.2*  CL 103 104  CO2 25 24  GLUCOSE 154* 122*  BUN 24* 20  CREATININE 1.77* 1.83*  CALCIUM 8.1* 8.0*   PT/INR Recent Labs    07/12/19 1502 07/13/19 0550  LABPROT 12.9 14.2  INR 1.0 1.1   Assessment & Plan: 59 year old male with a solitary left kidney, chronic left hydroureteronephrosis, and chronic  constipation/fecal impaction admitted with urosepsis.  WBC count downtrending on broad-spectrum antibiotics.  Creatinine relatively stable today following Foley catheter placement with suspicion for possible high-pressure bladder.  Patient is well-appearing on physical exam today.  Recommend continuing with plans for conservative management with daily monitoring of WBC count and  creatinine.  Urine culture ultimately resulted with multiple species.  I suspect his infection is being appropriately covered given downtrending WBC count and normalized lactate on broad-spectrum antibiotics, however may consider repeat urine culture if he fails to improve overnight.  Recommendations: -Continue Foley -Renal ultrasound tomorrow to reevaluate hydronephrosis (ordered) -Daily CBC, BMP -Continue aggressive bowel regimen -Consider repeat urine culture if he fails to improve -Contact urology immediately if patient decompensates for source control with ureteral stent versus PCN placement  Debroah Loop, PA-C 07/14/2019

## 2019-07-14 NOTE — Evaluation (Signed)
Physical Therapy Evaluation Patient Details Name: Jason Reed MRN: [id number] DOB: [birth year] Today's Date: 07/14/2019   History of Present Illness  Jason Reed is a 59 y.o. year old male with solitary kidney and chronic left hydroureteronephrosis presenting to the emergency room with generalized weakness, confusion and fevers to 102.  On further evaluation, he was noted to have a positive urinalysis and blood cultures growing E. coli preliminarily. Patient found with UTI and sepsis. Pt with increased temp this date, however cleared to participate via RN staff.  Clinical Impression  Pt is a pleasant 59 year old male who was admitted for sepsis. Pt performs bed mobility with min assist, transfers with min assist +2 and ambulation with cga and RW +2. Pt demonstrates deficits with strength/mobility/endurance.  Per chart review, he was indep prior to admission. Currently isn't at baseline level. Would benefit from skilled PT to address above deficits and promote optimal return to PLOF; recommend transition to STR upon discharge from acute hospitalization.      Follow Up Recommendations SNF    Equipment Recommendations  None recommended by PT    Recommendations for Other Services       Precautions / Restrictions Precautions Precautions: Fall Restrictions Weight Bearing Restrictions: No      Mobility  Bed Mobility Overal bed mobility: Needs Assistance Bed Mobility: Rolling;Supine to Sit;Sit to Supine Rolling: Min assist   Supine to sit: Min assist     General bed mobility comments: able to follow commands, however needs cues for initiation of task. ONce seated at EOB, able to sit with upright posture.   Transfers Overall transfer level: Needs assistance Equipment used: Rolling walker (2 wheeled) Transfers: Sit to/from Stand Sit to Stand: +2 safety/equipment;From elevated surface;Min assist Stand pivot transfers: Min assist;+2 safety/equipment       General transfer  comment: bed elevated prior to standing. UPright posture and demonstrates safe technque  Ambulation/Gait Ambulation/Gait assistance: Min guard;+2 safety/equipment Gait Distance (Feet): 8 Feet Assistive device: Rolling walker (2 wheeled) Gait Pattern/deviations: Step-to pattern     General Gait Details: ambulated a few steps in forward direction and then able to ambulate backwards towards chair. Needs assist for sequencing and proper positioning prior to sitting down in recliner  Stairs            Wheelchair Mobility    Modified Rankin (Stroke Patients Only)       Balance Overall balance assessment: Needs assistance Sitting-balance support: Feet supported Sitting balance-Leahy Scale: Good     Standing balance support: Bilateral upper extremity supported Standing balance-Leahy Scale: Fair                               Pertinent Vitals/Pain Pain Assessment: No/denies pain    Home Living Family/patient expects to be discharged to:: Group home                 Additional Comments: has 5 steps to enter with B rails    Prior Function Level of Independence: Needs assistance   Gait / Transfers Assistance Needed: pt reports ambulating with rollator     Comments: reports he has no difficulty with ambulating and reports no fall     Hand Dominance   Dominant Hand: Right    Extremity/Trunk Assessment   Upper Extremity Assessment Upper Extremity Assessment: Generalized weakness(B UE grossly 4/5)    Lower Extremity Assessment Lower Extremity Assessment: Generalized weakness(B LE grossly 3/5 and fatigues with exertion)  Communication   Communication: No difficulties  Cognition Arousal/Alertness: Awake/alert Behavior During Therapy: WFL for tasks assessed/performed Overall Cognitive Status: No family/caregiver present to determine baseline cognitive functioning                                 General Comments: Able to  communicate needs appropriately and follow directions.      General Comments      Exercises Other Exercises Other Exercises: Co-tx with PT for optimal safety to address functional transfers requiring MIN A x 2 for sit<>stand and SPT Other Exercises: supine ther-ex performed on B LE including SLRs, AP, and heel slides. Also performed B UE shoulder flexion. 10 reps with supervision and fatigues with exertion.  Other Exercises: Pt with incontient BM upon arrival. Mod assist +2 for hygiene.   Assessment/Plan    PT Assessment Patient needs continued PT services  PT Problem List Decreased strength;Decreased activity tolerance;Decreased balance;Decreased mobility;Decreased knowledge of use of DME;Decreased cognition       PT Treatment Interventions Gait training;Therapeutic activities;Therapeutic exercise;Balance training    PT Goals (Current goals can be found in the Care Plan section)  Acute Rehab PT Goals Patient Stated Goal: "feel better" PT Goal Formulation: With patient Time For Goal Achievement: 07/28/19 Potential to Achieve Goals: Good    Frequency Min 2X/week   Barriers to discharge        Co-evaluation               AM-PAC PT "6 Clicks" Mobility  Outcome Measure Help needed turning from your back to your side while in a flat bed without using bedrails?: A Little Help needed moving from lying on your back to sitting on the side of a flat bed without using bedrails?: A Little Help needed moving to and from a bed to a chair (including a wheelchair)?: A Little Help needed standing up from a chair using your arms (e.g., wheelchair or bedside chair)?: A Little Help needed to walk in hospital room?: A Lot Help needed climbing 3-5 steps with a railing? : Total 6 Click Score: 15    End of Session Equipment Utilized During Treatment: Gait belt Activity Tolerance: Patient tolerated treatment well Patient left: in chair;with chair alarm set Nurse Communication: Mobility  status PT Visit Diagnosis: Unsteadiness on feet (R26.81);Muscle weakness (generalized) (M62.81);Difficulty in walking, not elsewhere classified (R26.2)    Time: [id number] PT Time Calculation (min) (ACUTE ONLY): 35 min   Charges:   PT Evaluation $PT Eval Moderate Complexity: 1 Mod PT Treatments $Therapeutic Exercise: 8-22 mins        Greggory Stallion, PT, DPT [phone]   Amron Guerrette 07/14/2019, 1:15 PM

## 2019-07-14 NOTE — NC FL2 (Addendum)
[place] LEVEL OF CARE SCREENING TOOL     IDENTIFICATION  Patient Name: Jason Reed Birthdate: November 16, 1960 Sex: male Admission Date (Current Location): 07/12/2019  [place] and [place] Number:  Engineering geologist and Address:  [facility], [address], [place], [place] [address]      Provider Number: [id number]  Attending Physician Name and Address:  Elodia Florence., *  Relative Name and Phone Number:  Tawana Scale [phone]    Current Level of Care: Hospital Recommended Level of Care: [place] Prior Approval Number:    Date Approved/Denied:   PASRR Number: [id number]  Discharge Plan: SNF    Current Diagnoses: Patient Active Problem List   Diagnosis Date Noted  . Lactic acid acidosis 04/10/2019  . Left leg weakness 04/09/2019  . CKD (chronic kidney disease), stage III 04/09/2019  . Hypokalemia 04/09/2019  . Anemia due to chronic kidney disease 04/09/2019  . Essential hypertension 04/09/2019  . Hyperlipidemia   . Sepsis ([place])   . Acute lower UTI   . 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 AKI (acute kidney injury) ([place])   . Obesity (BMI 30-39.9)   . Psychosis ([place])   . Seizure disorder ([place])   . Rectal polyp   . Ectopic gastric tissue   . Stomach irritation   . Loss of weight   . Colon cancer screening   . Edema of colon   . Septic shock ([place]) 01/08/2017    Orientation RESPIRATION BLADDER Height & Weight     Self, Time, Situation, Place  Normal External catheter Weight: 99.8 kg Height:  6' (182.9 cm)  BEHAVIORAL SYMPTOMS/MOOD NEUROLOGICAL BOWEL NUTRITION STATUS      Incontinent Diet(Carb modified)  AMBULATORY STATUS COMMUNICATION OF NEEDS Skin   Limited Assist Verbally Normal                       Personal Care Assistance Level of Assistance  Bathing, Feeding, Dressing Bathing  Assistance: Limited assistance Feeding assistance: Limited assistance Dressing Assistance: Limited assistance     Functional Limitations Info             SPECIAL CARE FACTORS FREQUENCY                       Contractures Contractures Info: Not present    Additional Factors Info  Code Status, Allergies Code Status Info: Full Allergies Info: Lisinopril           Current Medications (07/14/2019):  This is the current hospital active medication list Current Facility-Administered Medications  Medication Dose Route Frequency Provider Last Rate Last Admin  . acetaminophen (TYLENOL) tablet 1,000 mg  1,000 mg Oral Q6H PRN Sharion Settler, NP   1,000 mg at 07/14/19 0908   Or  . acetaminophen (TYLENOL) suppository 650 mg  650 mg Rectal Q6H PRN Sharion Settler, NP      . aspirin EC tablet 81 mg  81 mg Oral Daily Mansy, Jan A, MD   81 mg at 07/14/19 0908  . cefTRIAXone (ROCEPHIN) 2 g in sodium chloride 0.9 % 100 mL IVPB  2 g Intravenous Q24H Mansy, Arvella Merles, MD   Stopped at 07/14/19 0123  . Chlorhexidine Gluconate Cloth 2 % PADS 6 each  6 each Topical Daily Elodia Florence., MD   6 each at 07/14/19 1215  .  clonazePAM (KLONOPIN) tablet 1 mg  1 mg Oral QHS Mansy, Jan A, MD   1 mg at 07/13/19 2140  . enoxaparin (LOVENOX) injection 40 mg  40 mg Subcutaneous Q24H Mansy, Jan A, MD   40 mg at 07/13/19 2136  . gabapentin (NEURONTIN) tablet 300 mg  300 mg Oral TID Mansy, Jan A, MD   300 mg at 07/14/19 0908  . insulin aspart (novoLOG) injection 0-9 Units  0-9 Units Subcutaneous TID PC & HS Mansy, Arvella Merles, MD   1 Units at 07/14/19 1414  . lactated ringers infusion   Intravenous Continuous Sharion Settler, NP 125 mL/hr at 07/14/19 0603 New Bag at 07/14/19 0603  . loratadine (CLARITIN) tablet 10 mg  10 mg Oral Daily Mansy, Jan A, MD   10 mg at 07/14/19 0908  . magnesium hydroxide (MILK OF MAGNESIA) suspension 30 mL  30 mL Oral Daily PRN Mansy, Jan A, MD      . mirtazapine (REMERON) tablet 15  mg  15 mg Oral QHS Mansy, Jan A, MD   15 mg at 07/13/19 2140  . ondansetron (ZOFRAN) tablet 4 mg  4 mg Oral Q6H PRN Mansy, Jan A, MD       Or  . ondansetron [facility]) injection 4 mg  4 mg Intravenous Q6H PRN Mansy, Jan A, MD      . phenytoin (DILANTIN) ER capsule 100 mg  100 mg Oral q AM Shanlever, Pierce Crane, RPH   100 mg at 07/14/19 0559   And  . phenytoin (DILANTIN) ER capsule 300 mg  300 mg Oral QPM Lu Duffel, RPH   300 mg at 07/13/19 2137  . polyethylene glycol (MIRALAX / GLYCOLAX) packet 17 g  17 g Oral BID Elodia Florence., MD   17 g at 07/13/19 1012  . psyllium (HYDROCIL/METAMUCIL) packet 1 packet  1 packet Oral Daily Mansy, Jan A, MD      . simvastatin (ZOCOR) tablet 20 mg  20 mg Oral QHS Mansy, Jan A, MD   20 mg at 07/13/19 2140  . tamsulosin (FLOMAX) capsule 0.4 mg  0.4 mg Oral Daily Mansy, Jan A, MD   0.4 mg at 07/14/19 0908  . traZODone (DESYREL) tablet 25 mg  25 mg Oral QHS PRN Mansy, Arvella Merles, MD         Discharge Medications: Please see discharge summary for a list of discharge medications.  Relevant Imaging Results:  Relevant Lab Results:   Additional Information SS# [id number]  Shelbie Hutching, RN

## 2019-07-14 NOTE — TOC Progression Note (Signed)
Transition of Care [facility]) - Progression Note    Patient Details  Name: Colum Serrette MRN: [id number] Date of Birth: Jun 25, 1960  [facility] CM/SW Contact  Shelbie Hutching, RN Phone Number: 07/14/2019, 9:42 AM  Clinical Narrative:    RNCM was able to speak with [name] from the group home.  Clara is patient's caregiver, she reports that the patient is independent at the group home and walked into the hospital on the day he was admitted.  Patient has been to [facility] in the past, Clara does not want patient to go back to short term rehab if we can help it.  If patient does require SNF at discharge she prefers [facility].  RNCM will cont to follow and assist with discharge planning.    Expected Discharge Plan: Group Home Barriers to Discharge: Continued Medical Work up  Expected Discharge Plan and Services Expected Discharge Plan: Group Home   Discharge Planning Services: CM Consult   Living arrangements for the past 2 months: Group Home                                       Social Determinants of Health (SDOH) Interventions    Readmission Risk Interventions No flowsheet data found.

## 2019-07-14 NOTE — Care Management Important Message (Signed)
Important Message  Patient Details  Name: Jason Reed MRN: [id number] Date of Birth: [birth year]   Medicare Important Message Given:  Yes     Juliann Pulse A Lorice Lafave 07/14/2019, 10:59 AM

## 2019-07-14 NOTE — Evaluation (Signed)
Occupational Therapy Evaluation Patient Details Name: Jason Reed MRN: [id number] DOB: July 25, 1960 Today's Date: 07/14/2019    History of Present Illness Jason Reed is a 59 y.o. year old male with solitary kidney and chronic left hydroureteronephrosis presenting to the emergency room with generalized weakness, confusion and fevers to 102.  On further evaluation, he was noted to have a positive urinalysis and blood cultures growing E. coli preliminarily. Patient found with UTI and sepsis.   Clinical Impression   OT session targeting functional transfers and standing tolerance this date.  Completed co-tx with PT for optimal safety regarding leads and patient's debility.  Patient demonstrated improved ability to follow multi step directions this date.  Performed sit<>stand and SPT using RW and MIN A x 2.  Required cues for sequencing, safety awareness and elevated seat for sit to stand.  Patient able to tolerate sitting in recliner with all needs in reach this date. Patient participated well and would benefit from continued occupational therapy services to address performance components outlined in evaluation.  Based on today's performance, continuing to recommend SNF.    Follow Up Recommendations  SNF    Equipment Recommendations  Other (comment)(defer to next level of care)    Recommendations for Other Services       Precautions / Restrictions Precautions Precautions: Fall Restrictions Weight Bearing Restrictions: No      Mobility Bed Mobility                  Transfers Overall transfer level: Needs assistance Equipment used: Rolling walker (2 wheeled) Transfers: Sit to/from [employer] Transfers Sit to Stand: Min assist;+2 safety/equipment Stand pivot transfers: Min assist;+2 safety/equipment       General transfer comment: Demonstrated significant improvement in strength and ability to participate this date    Balance Overall balance assessment: Mild  deficits observed, not formally tested                                         ADL either performed or assessed with clinical judgement   ADL Overall ADL's : Needs assistance/impaired                                             Vision Patient Visual Report: No change from baseline       Perception     Praxis      Pertinent Vitals/Pain Pain Assessment: No/denies pain     Hand Dominance Right   Extremity/Trunk Assessment Upper Extremity Assessment Upper Extremity Assessment: Generalized weakness   Lower Extremity Assessment Lower Extremity Assessment: Defer to PT evaluation       Communication Communication Communication: No difficulties   Cognition Arousal/Alertness: Awake/alert Behavior During Therapy: WFL for tasks assessed/performed Overall Cognitive Status: No family/caregiver present to determine baseline cognitive functioning                                 General Comments: Able to communicate needs appropriately and follow directions.   General Comments       Exercises Other Exercises Other Exercises: Co-tx with PT for optimal safety to address functional transfers requiring MIN A x 2 for sit<>stand and SPT   Shoulder Instructions  Home Living Family/patient expects to be discharged to:: Group home                                        Prior Functioning/Environment Level of Independence: Needs assistance        Comments: Patient reports he needed no asstance with toileting, dressing or showering and was able to walk with a walker.        OT Problem List: Decreased strength;Decreased activity tolerance;Impaired balance (sitting and/or standing)      OT Treatment/Interventions: Self-care/ADL training;Therapeutic exercise;Energy conservation;DME and/or AE instruction;Therapeutic activities;Patient/family education    OT Goals(Current goals can be found in the care plan  section)    OT Frequency: Min 1X/week   Barriers to D/C:            Co-evaluation              AM-PAC OT "6 Clicks" Daily Activity     Outcome Measure Help from another person eating meals?: A Little Help from another person taking care of personal grooming?: A Little Help from another person toileting, which includes using toliet, bedpan, or urinal?: A Lot Help from another person bathing (including washing, rinsing, drying)?: A Lot Help from another person to put on and taking off regular upper body clothing?: A Lot Help from another person to put on and taking off regular lower body clothing?: A Lot 6 Click Score: 14   End of Session Equipment Utilized During Treatment: Gait belt Nurse Communication: Other (comment)(Discussed temperature and ability to participate in therapy)  Activity Tolerance: Patient tolerated treatment well Patient left: in chair;with call bell/phone within reach;with chair alarm set  OT Visit Diagnosis: History of falling (Z91.81);Muscle weakness (generalized) (M62.81)                Time: 0950-1002 OT Time Calculation (min): 12 min Charges:  OT General Charges $OT Visit: 1 Visit OT Treatments $Therapeutic Activity: 8-22 mins  Baldomero Lamy, MS, OTR/L 07/14/19, 10:27 AM

## 2019-07-14 NOTE — Progress Notes (Addendum)
PROGRESS NOTE    Jason Reed  [id number] DOB: 17-Apr-1961 DOA: 07/12/2019 PCP: Marguerita Merles, MD   Brief Narrative:  Jason Reed  is Jason Reed 59 y.o. African-American male with Almena Hokenson known history of hypertension, type diabetes mellitus, dyslipidemia, seizure disorder and psychosis, who presented to the emergency room, who presented to the emergency room with acute onset of generalized weakness over the last couple of days with associated altered mental status with confusion.  He admitted to urinary frequency without dysuria, oliguria or hematuria or flank pain.  He admitted to having fever and cold chills.  He denies any chest pain or dyspnea or cough.  No rhinorrhea or nasal congestion or sore throat.  No nausea or vomiting or abdominal pain.  He's been admitted for treatment of sepsis 2/2 e. Coli bacteremia in setting of UTI.  Assessment & Plan:   Active Problems:   Sepsis ([place])  1.  Sepsis  E. Coli Bacteremia 2/2 Urinary Tract Infection - continues to have intermittent fevers - Continue ceftriaxone 2 g daily - urine culture multiple species - blood culture gram negative rods with BCID showing e. Coli -> sensitivities pending - CT abd/pelvis showed L hydroureteronephrosis, which appears chronic to some degree, in setting of infection, will discuss with urology - urology recommending foley catheter, repeat US, aggressive bowel regimen - c/s urology immediately if he decompensates - Significantly elevated procalcitonin, CXR with poor inspiration, patchy bilateral lung densities 2/2 poor inspiration vs pneumonia (in setting of above, suspect poor inspiration) - Continue IVF  2.  Acute kidney injury. - baseline renal function 1.27 in 04/2019 - on presentation 1.92 - 1.83 today, continue IVF  # Generalized Weakness  Acute Metabolic Encephalopathy: 2/2 sepsis above, follow with treatment with IV abx - PT/OT  4.  Type 2 diabetes mellitus. - A1c is 5.9 - SSI, metformin on  hold  5.  Psychotic disorder. - Continue home klonopin - abilify and seroquel currently on hold with his encephalopathy on presentation, follow for resumption  6.  Seizure disorder. -We will continue Dilantin (11.6, level).  7.  Dyslipidemia. -We will continue statin therapy.  8.  BPH. -We will continue Flomax.  # Constipation: imaging suggestive of fecal impaction in setting of chronic colonic dysmotility.  Per nursing, pt had large BM 3/11 AM.  No significant impaction noted after this on exam per nursing.  Continue bowel regimen.  # Thrombocytopenia: will continue to monitor, improved, possibly related to sepsis  DVT prophylaxis: lovenox Code Status: full Family Communication: none at bedside - called contact, no answer Disposition Plan:  . Patient came from: home            . Anticipated d/c place: home . Barriers to d/c OR conditions which need to be met to effect Efraim Vanallen safe d/c: pending sensitivities for e. Coli bacteremia, further improvement from sepsis   Consultants:   none  Procedures:   none  Antimicrobials: Anti-infectives (From admission, onward)   Start     Dose/Rate Route Frequency Ordered Stop   07/13/19 0100  cefTRIAXone (ROCEPHIN) 2 g in sodium chloride 0.9 % 100 mL IVPB     2 g 200 mL/hr over 30 Minutes Intravenous Every 24 hours 07/12/19 2216     07/12/19 1615  ceFEPIme (MAXIPIME) 2 g in sodium chloride 0.9 % 100 mL IVPB     2 g 200 mL/hr over 30 Minutes Intravenous  Once 07/12/19 1612 07/12/19 1824   07/12/19 1615  metroNIDAZOLE (FLAGYL) IVPB 500 mg  500 mg 100 mL/hr over 60 Minutes Intravenous  Once 07/12/19 1612 07/12/19 1942   07/12/19 1615  vancomycin (VANCOCIN) IVPB 1000 mg/200 mL premix     1,000 mg 200 mL/hr over 60 Minutes Intravenous  Once 07/12/19 1612 07/12/19 1941      Subjective: No new complaints today, feeling [name] little better  Objective: Vitals:   07/13/19 2355 07/14/19 0559 07/14/19 0750 07/14/19 1030  BP:  (!) 135/50  (!) 128/44   Pulse:  86    Resp:  19    Temp: 98.5 F (36.9 C) (!) 100.7 F (38.2 C) 100.3 F (37.9 C) 98.3 F (36.8 C)  TempSrc: Oral Oral Oral Oral  SpO2:  99%    Weight:      Height:        Intake/Output Summary (Last 24 hours) at 07/14/2019 1908 Last data filed at 07/14/2019 1904 Gross per 24 hour  Intake 1220 ml  Output 1450 ml  Net -230 ml   Filed Weights   07/12/19 1457  Weight: 99.8 kg    Examination:  General: No acute distress. Cardiovascular: Heart sounds show Mariana Wiederholt regular rate, and rhythm.  Lungs: Clear to auscultation bilaterally Abdomen: Soft, nontender, nondistended  Neurological: Alert. Moves all extremities 4. Cranial nerves II through XII grossly intact. Skin: Warm and dry. No rashes or lesions. Extremities: No clubbing or cyanosis. No edema.   Data Reviewed: I have personally reviewed following labs and imaging studies  CBC: Recent Labs  Lab 07/12/19 1502 07/13/19 0550 07/14/19 0548  WBC 13.5* 11.4* 9.2  NEUTROABS 11.2*  --  6.7  HGB 10.8* 10.1* 9.9*  HCT 35.0* 32.1* 31.5*  MCV 93.3 92.0 90.3  PLT 125* 113* 106*   Basic Metabolic Panel: Recent Labs  Lab 07/12/19 1502 07/13/19 0550 07/14/19 0548  NA 137 138 137  K 4.0 3.7 3.2*  CL 99 103 104  CO2 '24 25 24  ' GLUCOSE 202* 154* 122*  BUN 26* 24* 20  CREATININE 1.92* 1.77* 1.83*  CALCIUM 8.7* 8.1* 8.0*  MG  --   --  2.0  PHOS  --   --  2.9   GFR: Estimated Creatinine Clearance: 52.5 mL/min (Feather Berrie) (by C-G formula based on SCr of 1.83 mg/dL (H)). Liver Function Tests: Recent Labs  Lab 07/12/19 1502 07/14/19 0548  AST 25 21  ALT 16 15  ALKPHOS 88 69  BILITOT 1.1 1.1  PROT 7.7 6.5  ALBUMIN 3.6 2.8*   No results for input(s): LIPASE, AMYLASE in the last 168 hours. No results for input(s): AMMONIA in the last 168 hours. Coagulation Profile: Recent Labs  Lab 07/12/19 1502 07/13/19 0550  INR 1.0 1.1   Cardiac Enzymes: No results for input(s): CKTOTAL, CKMB, CKMBINDEX, TROPONINI  in the last 168 hours. BNP (last 3 results) No results for input(s): PROBNP in the last 8760 hours. HbA1C: Recent Labs    07/13/19 0550  HGBA1C 5.9*   CBG: Recent Labs  Lab 07/13/19 1655 07/13/19 2113 07/14/19 0753 07/14/19 1139 07/14/19 1717  GLUCAP 133* 153* 116* 140* 154*   Lipid Profile: No results for input(s): CHOL, HDL, LDLCALC, TRIG, CHOLHDL, LDLDIRECT in the last 72 hours. Thyroid Function Tests: No results for input(s): TSH, T4TOTAL, FREET4, T3FREE, THYROIDAB in the last 72 hours. Anemia Panel: No results for input(s): VITAMINB12, FOLATE, FERRITIN, TIBC, IRON, RETICCTPCT in the last 72 hours. Sepsis Labs: Recent Labs  Lab 07/12/19 1502 07/12/19 1818 07/13/19 0550  PROCALCITON  --   --  11.73  LATICACIDVEN 2.3* 1.6  --     Recent Results (from the past 240 hour(s))  Culture, blood (Routine x 2)     Status: Abnormal (Preliminary result)   Collection Time: 07/12/19  3:02 PM   Specimen: BLOOD RIGHT HAND  Result Value Ref Range Status   Specimen Description BLOOD RIGHT HAND  Final   Special Requests   Final    BOTTLES DRAWN AEROBIC AND ANAEROBIC Blood Culture adequate volume Performed at [facility], [address] [place], [place] [address]    Culture  Setup Time   Final    GRAM NEGATIVE RODS IN BOTH AEROBIC AND ANAEROBIC BOTTLES CRITICAL RESULT CALLED TO, READ BACK BY AND VERIFIED WITH: Rito Ehrlich AT 2536 07/13/19 SDR    Culture (Carolyn Maniscalco)  Final    ESCHERICHIA COLI SUSCEPTIBILITIES TO FOLLOW Performed at [facility], [place] [address], [place], [place] [address]    Report Status PENDING  Incomplete  Blood Culture ID Panel (Reflexed)     Status: Abnormal   Collection Time: 07/12/19  3:02 PM  Result Value Ref Range Status   Enterococcus species NOT DETECTED NOT DETECTED Final   Listeria monocytogenes NOT DETECTED NOT DETECTED Final   Staphylococcus species NOT DETECTED NOT DETECTED Final   Staphylococcus aureus (BCID) NOT DETECTED NOT  DETECTED Final   Streptococcus species NOT DETECTED NOT DETECTED Final   Streptococcus agalactiae NOT DETECTED NOT DETECTED Final   Streptococcus pneumoniae NOT DETECTED NOT DETECTED Final   Streptococcus pyogenes NOT DETECTED NOT DETECTED Final   Acinetobacter baumannii NOT DETECTED NOT DETECTED Final   Enterobacteriaceae species DETECTED (Mychelle Kendra) NOT DETECTED Final    Comment: Enterobacteriaceae represent Shamyia Grandpre large family of gram-negative bacteria, not Burhan Barham single organism. CRITICAL RESULT CALLED TO, READ BACK BY AND VERIFIED WITH:  Rito Ehrlich AT 4742 07/13/19 SDR    Enterobacter cloacae complex NOT DETECTED NOT DETECTED Final   Escherichia coli DETECTED ([place]) NOT DETECTED Final    Comment: CRITICAL RESULT CALLED TO, READ BACK BY AND VERIFIED WITH:  Rito Ehrlich AT 5956 07/13/19 SDR    Klebsiella oxytoca NOT DETECTED NOT DETECTED Final   Klebsiella pneumoniae NOT DETECTED NOT DETECTED Final   Proteus species NOT DETECTED NOT DETECTED Final   Serratia marcescens NOT DETECTED NOT DETECTED Final   Carbapenem resistance NOT DETECTED NOT DETECTED Final   Haemophilus influenzae NOT DETECTED NOT DETECTED Final   Neisseria meningitidis NOT DETECTED NOT DETECTED Final   Pseudomonas aeruginosa NOT DETECTED NOT DETECTED Final   Candida albicans NOT DETECTED NOT DETECTED Final   Candida glabrata NOT DETECTED NOT DETECTED Final   Candida krusei NOT DETECTED NOT DETECTED Final   Candida parapsilosis NOT DETECTED NOT DETECTED Final   Candida tropicalis NOT DETECTED NOT DETECTED Final    Comment: Performed at [facility], [address] [place], [place] [address]  Culture, blood (Routine x 2)     Status: None (Preliminary result)   Collection Time: 07/12/19  3:05 PM   Specimen: BLOOD RIGHT HAND  Result Value Ref Range Status   Specimen Description BLOOD RIGHT HAND  Final   Special Requests   Final    BOTTLES DRAWN AEROBIC AND ANAEROBIC Blood Culture results may not be optimal due to an  inadequate volume of blood received in culture bottles Performed at [facility], [address], [place], [place] [address]    Culture  Setup Time   Final    GRAM NEGATIVE RODS ANAEROBIC BOTTLE ONLY CRITICAL VALUE  NOTED.  VALUE IS CONSISTENT WITH PREVIOUSLY REPORTED AND CALLED VALUE. Performed at [facility], [address], [place], [place] [address]    Culture   Final    Lonell Grandchild NEGATIVE RODS IDENTIFICATION TO FOLLOW Performed at [facility], [address] [place], [place] [address]    Report Status PENDING  Incomplete  Urine culture     Status: Abnormal   Collection Time: 07/12/19  6:17 PM   Specimen: In/Out Cath Urine  Result Value Ref Range Status   Specimen Description   Final    IN/OUT CATH URINE Performed at [facility], [address] [place], [place] [address]    Special Requests   Final    NONE Performed at [facility], [address] [place], [place] [address]    Culture MULTIPLE SPECIES PRESENT, SUGGEST RECOLLECTION (Nidia Grogan)  Final   Report Status 07/13/2019 FINAL  Final  SARS CORONAVIRUS 2 (TAT 6-24 HRS) Nasopharyngeal Nasopharyngeal Swab     Status: None   Collection Time: 07/12/19  7:43 PM   Specimen: Nasopharyngeal Swab  Result Value Ref Range Status   SARS Coronavirus 2 NEGATIVE NEGATIVE Final    Comment: (NOTE) SARS-CoV-2 target nucleic acids are NOT DETECTED. The SARS-CoV-2 RNA is generally detectable in upper and lower respiratory specimens during the acute phase of infection. Negative results do not preclude SARS-CoV-2 infection, do not rule out co-infections with other pathogens, and should not be used as the sole basis for treatment or other patient management decisions. Negative results must be combined with clinical observations, patient history, and epidemiological information. The expected result is Negative. Fact Sheet for Patients: SugarRoll.be Fact Sheet for  Healthcare Providers: https://www.woods-mathews.com/ This test is not yet approved or cleared by the [place] FDA and  has been authorized for detection and/or diagnosis of SARS-CoV-2 by FDA under an Emergency Use Authorization (EUA). This EUA will remain  in effect (meaning this test can be used) for the duration of the COVID-19 declaration under Section 56 4(b)(1) of the Act, 21 U.S.C. section 360bbb-3(b)(1), unless the authorization is terminated or revoked sooner. Performed at [facility], [place] [address] [place], [place] [address]          Radiology Studies: US RENAL  Result Date: 07/14/2019 CLINICAL DATA:  Hydronephrosis EXAM: RENAL / URINARY TRACT ULTRASOUND COMPLETE COMPARISON:  CT 07/12/2019 FINDINGS: Right Kidney: Absent Left Kidney: Renal measurements: 13.2 x 7.1 x 6 cm = volume: 294.6 mL. Cortical echogenicity normal. Moderate left hydronephrosis. No mass. Bladder: Foley catheter within the empty urinary bladder Other: None. IMPRESSION: 1. Absent right kidney 2. Moderate left hydronephrosis Electronically Signed   By: Donavan Foil M.D.   On: 07/14/2019 16:57        Scheduled Meds: . aspirin EC  81 mg Oral Daily  . Chlorhexidine Gluconate Cloth  6 each Topical Daily  . clonazePAM  1 mg Oral QHS  . enoxaparin (LOVENOX) injection  40 mg Subcutaneous Q24H  . gabapentin  300 mg Oral TID  . insulin aspart  0-9 Units Subcutaneous TID PC & HS  . loratadine  10 mg Oral Daily  . mirtazapine  15 mg Oral QHS  . phenytoin  100 mg Oral q AM   And  . phenytoin  300 mg Oral QPM  . polyethylene glycol  17 g Oral BID  . psyllium  1 packet Oral Daily  . simvastatin  20 mg Oral QHS  . tamsulosin  0.4 mg Oral  Daily   Continuous Infusions: . cefTRIAXone (ROCEPHIN)  IV Stopped (07/14/19 0123)  . lactated ringers 125 mL/hr at 07/14/19 0603     LOS: 2 days   Time spent: over 68 min    Fayrene Helper, MD [facility]   To contact the  attending provider between 7A-7P or the covering provider during after hours 7P-7A, please log into the web site [email or web address] and access using universal [place] password for that web site. If you do not have the password, please call the hospital operator.  07/14/2019, 7:08 PM

## 2019-07-15 DIAGNOSIS — N179 Acute kidney failure, unspecified: Secondary | ICD-10-CM

## 2019-07-15 DIAGNOSIS — R531 Weakness: Secondary | ICD-10-CM

## 2019-07-15 DIAGNOSIS — N1330 Unspecified hydronephrosis: Secondary | ICD-10-CM

## 2019-07-15 DIAGNOSIS — A419 Sepsis, unspecified organism: Secondary | ICD-10-CM

## 2019-07-15 LAB — CULTURE, BLOOD (ROUTINE X 2): Special Requests: ADEQUATE

## 2019-07-15 LAB — CBC WITH DIFFERENTIAL/PLATELET
Abs Immature Granulocytes: 0.03 10*3/uL (ref 0.00–0.07)
Basophils Absolute: 0.1 10*3/uL (ref 0.0–0.1)
Basophils Relative: 1 %
Eosinophils Absolute: 0.2 10*3/uL (ref 0.0–0.5)
Eosinophils Relative: 3 %
HCT: 29.4 % — ABNORMAL LOW (ref 39.0–52.0)
Hemoglobin: 9.2 g/dL — ABNORMAL LOW (ref 13.0–17.0)
Immature Granulocytes: 1 %
Lymphocytes Relative: 20 %
Lymphs Abs: 1.2 10*3/uL (ref 0.7–4.0)
MCH: 28.5 pg (ref 26.0–34.0)
MCHC: 31.3 g/dL (ref 30.0–36.0)
MCV: 91 fL (ref 80.0–100.0)
Monocytes Absolute: 0.9 10*3/uL (ref 0.1–1.0)
Monocytes Relative: 16 %
Neutro Abs: 3.5 10*3/uL (ref 1.7–7.7)
Neutrophils Relative %: 59 %
Platelets: 159 10*3/uL (ref 150–400)
RBC: 3.23 MIL/uL — ABNORMAL LOW (ref 4.22–5.81)
RDW: 14.1 % (ref 11.5–15.5)
WBC: 5.8 10*3/uL (ref 4.0–10.5)
nRBC: 0 % (ref 0.0–0.2)

## 2019-07-15 LAB — GLUCOSE, CAPILLARY
Glucose-Capillary: 138 mg/dL — ABNORMAL HIGH (ref 70–99)
Glucose-Capillary: 139 mg/dL — ABNORMAL HIGH (ref 70–99)
Glucose-Capillary: 156 mg/dL — ABNORMAL HIGH (ref 70–99)

## 2019-07-15 LAB — COMPREHENSIVE METABOLIC PANEL
ALT: 14 U/L (ref 0–44)
AST: 16 U/L (ref 15–41)
Albumin: 2.7 g/dL — ABNORMAL LOW (ref 3.5–5.0)
Alkaline Phosphatase: 70 U/L (ref 38–126)
Anion gap: 11 (ref 5–15)
BUN: 23 mg/dL — ABNORMAL HIGH (ref 6–20)
CO2: 23 mmol/L (ref 22–32)
Calcium: 8 mg/dL — ABNORMAL LOW (ref 8.9–10.3)
Chloride: 104 mmol/L (ref 98–111)
Creatinine, Ser: 1.59 mg/dL — ABNORMAL HIGH (ref 0.61–1.24)
GFR calc Af Amer: 54 mL/min — ABNORMAL LOW (ref >60)
GFR calc non Af Amer: 46 mL/min — ABNORMAL LOW (ref >60)
Glucose, Bld: 116 mg/dL — ABNORMAL HIGH (ref 70–99)
Potassium: 3.5 mmol/L (ref 3.5–5.1)
Sodium: 138 mmol/L (ref 135–145)
Total Bilirubin: 0.9 mg/dL (ref 0.3–1.2)
Total Protein: 6.3 g/dL — ABNORMAL LOW (ref 6.5–8.1)

## 2019-07-15 LAB — MAGNESIUM: Magnesium: 2.2 mg/dL (ref 1.7–2.4)

## 2019-07-15 LAB — PHOSPHORUS: Phosphorus: 2.7 mg/dL (ref 2.5–4.6)

## 2019-07-15 MED ORDER — SODIUM CHLORIDE 0.9 % IV SOLN
INTRAVENOUS | Status: DC | PRN
  Administered 2019-07-15 – 2019-07-17 (×4): 250 mL via INTRAVENOUS

## 2019-07-15 NOTE — Progress Notes (Signed)
Subjective: Jason Reed is afebrile on current therapy and voices no complaints.   He has enterobacter and e. Coli on the blood culture and Mx species on urine culture.  He has persistent moderate hydro on renal US but his Cr is falling and he has good urine output from his solitary kidney so significant obstruction is unlikely.     ROS:  Review of Systems  Unable to perform ROS: Mental acuity    Anti-infectives: Anti-infectives (From admission, onward)   Start     Dose/Rate Route Frequency Ordered Stop   07/13/19 0100  cefTRIAXone (ROCEPHIN) 2 g in sodium chloride 0.9 % 100 mL IVPB     2 g 200 mL/hr over 30 Minutes Intravenous Every 24 hours 07/12/19 2216     07/12/19 1615  ceFEPIme (MAXIPIME) 2 g in sodium chloride 0.9 % 100 mL IVPB     2 g 200 mL/hr over 30 Minutes Intravenous  Once 07/12/19 1612 07/12/19 1824   07/12/19 1615  metroNIDAZOLE (FLAGYL) IVPB 500 mg     500 mg 100 mL/hr over 60 Minutes Intravenous  Once 07/12/19 1612 07/12/19 1942   07/12/19 1615  vancomycin (VANCOCIN) IVPB 1000 mg/200 mL premix     1,000 mg 200 mL/hr over 60 Minutes Intravenous  Once 07/12/19 1612 07/12/19 1941      Current Facility-Administered Medications  Medication Dose Route Frequency Provider Last Rate Last Admin  . 0.9 %  sodium chloride infusion   Intravenous PRN Elodia Florence., MD   Stopped at 07/15/19 [phone]  . acetaminophen (TYLENOL) tablet 1,000 mg  1,000 mg Oral Q6H PRN Sharion Settler, NP   1,000 mg at 07/14/19 0908   Or  . acetaminophen (TYLENOL) suppository 650 mg  650 mg Rectal Q6H PRN Sharion Settler, NP      . aspirin EC tablet 81 mg  81 mg Oral Daily Mansy, Jan A, MD   81 mg at 07/15/19 1026  . cefTRIAXone (ROCEPHIN) 2 g in sodium chloride 0.9 % 100 mL IVPB  2 g Intravenous Q24H Mansy, Jan A, MD 200 mL/hr at 07/15/19 0345 Rate Verify at 07/15/19 0345  . Chlorhexidine Gluconate Cloth 2 % PADS 6 each  6 each Topical Daily Elodia Florence., MD   6 each at 07/15/19  1026  . clonazePAM (KLONOPIN) tablet 1 mg  1 mg Oral QHS Mansy, Jan A, MD   1 mg at 07/14/19 2252  . enoxaparin (LOVENOX) injection 40 mg  40 mg Subcutaneous Q24H Mansy, Jan A, MD   40 mg at 07/14/19 2252  . gabapentin (NEURONTIN) tablet 300 mg  300 mg Oral TID Mansy, Jan A, MD   300 mg at 07/15/19 1026  . insulin aspart (novoLOG) injection 0-9 Units  0-9 Units Subcutaneous TID PC & HS Mansy, Arvella Merles, MD   1 Units at 07/14/19 2251  . lactated ringers infusion   Intravenous Continuous Sharion Settler, NP 125 mL/hr at 07/15/19 0345 Rate Verify at 07/15/19 0345  . loratadine (CLARITIN) tablet 10 mg  10 mg Oral Daily Mansy, Jan A, MD   10 mg at 07/15/19 1026  . magnesium hydroxide (MILK OF MAGNESIA) suspension 30 mL  30 mL Oral Daily PRN Mansy, Jan A, MD      . mirtazapine (REMERON) tablet 15 mg  15 mg Oral QHS Mansy, Jan A, MD   15 mg at 07/14/19 2252  . ondansetron (ZOFRAN) tablet 4 mg  4 mg Oral Q6H PRN Mansy, Arvella Merles, MD  Or  . ondansetron (ZOFRAN) injection 4 mg  4 mg Intravenous Q6H PRN Mansy, Jan A, MD      . phenytoin (DILANTIN) ER capsule 100 mg  100 mg Oral q AM Shanlever, Pierce Crane, RPH   100 mg at 07/15/19 [id number]   And  . phenytoin (DILANTIN) ER capsule 300 mg  300 mg Oral QPM Lu Duffel, RPH   300 mg at 07/14/19 1827  . polyethylene glycol (MIRALAX / GLYCOLAX) packet 17 g  17 g Oral BID Elodia Florence., MD   17 g at 07/13/19 1012  . psyllium (HYDROCIL/METAMUCIL) packet 1 packet  1 packet Oral Daily Mansy, Jan A, MD      . senna-docusate (Senokot-S) tablet 2 tablet  2 tablet Oral QHS Elodia Florence., MD   2 tablet at 07/14/19 2251  . simvastatin (ZOCOR) tablet 20 mg  20 mg Oral QHS Mansy, Jan A, MD   20 mg at 07/14/19 2252  . tamsulosin (FLOMAX) capsule 0.4 mg  0.4 mg Oral Daily Mansy, Jan A, MD   0.4 mg at 07/15/19 1026  . traZODone (DESYREL) tablet 25 mg  25 mg Oral QHS PRN Mansy, Jan A, MD         Objective: Vital signs in last 24 hours: Temp:  [98.5 F  (36.9 C)-99 F (37.2 C)] 98.5 F (36.9 C) (03/13 1317) Pulse Rate:  [77-87] 77 (03/13 1317) Resp:  [17-20] 17 (03/13 1317) BP: (126-135)/(60-76) 126/76 (03/13 1317) SpO2:  [97 %-100 %] 100 % (03/13 1317)  Intake/Output from previous day: 03/12 0701 - 03/13 0700 In: 1524.3 [P.O.:120; I.V.:1336.2; IV Piggyback:68.1] Out: 700 [Urine:700] Intake/Output this shift: Total I/O In: 240 [P.O.:240] Out: -    Physical Exam Vitals reviewed.  Constitutional:      Appearance: Normal appearance.  Cardiovascular:     Rate and Rhythm: Normal rate and regular rhythm.  Pulmonary:     Effort: Pulmonary effort is normal. No respiratory distress.  Genitourinary:    Comments: Urine clear in tubing.  Neurological:     Mental Status: He is alert.     Lab Results:  Recent Labs    07/14/19 0548 07/15/19 0727  WBC 9.2 5.8  HGB 9.9* 9.2*  HCT 31.5* 29.4*  PLT 130* 159   BMET Recent Labs    07/14/19 0548 07/15/19 0727  NA 137 138  K 3.2* 3.5  CL 104 104  CO2 24 23  GLUCOSE 122* 116*  BUN 20 23*  CREATININE 1.83* 1.59*  CALCIUM 8.0* 8.0*   PT/INR Recent Labs    07/12/19 1502 07/13/19 0550  LABPROT 12.9 14.2  INR 1.0 1.1   ABG No results for input(s): PHART, HCO3 in the last 72 hours.  Invalid input(s): PCO2, PO2  Studies/Results: US RENAL  Result Date: 07/14/2019 CLINICAL DATA:  Hydronephrosis EXAM: RENAL / URINARY TRACT ULTRASOUND COMPLETE COMPARISON:  CT 07/12/2019 FINDINGS: Right Kidney: Absent Left Kidney: Renal measurements: 13.2 x 7.1 x 6 cm = volume: 294.6 mL. Cortical echogenicity normal. Moderate left hydronephrosis. No mass. Bladder: Foley catheter within the empty urinary bladder Other: None. IMPRESSION: 1. Absent right kidney 2. Moderate left hydronephrosis Electronically Signed   By: Donavan Foil M.D.   On: 07/14/2019 16:57     Assessment and Plan: Left hydro with AKI.  He is doing well with foley drainage with decline in Cr.  He has persistent moderate  hydro on renal US but this is a chronic finding.   No  intervention and this time.   Sepsis with enterobacter and e. Coli on Blood culture.  UCx had Mx species but the infection is most likely of urinary origin.  He is improving on antibiotic therapy and doesn't need a stent or perc at this time.       LOS: 3 days    Irine Seal 07/15/2019 [id number] ID: [name], male   DOB: Aug 08, 1960, 59 y.o.   MRN: [id number]

## 2019-07-15 NOTE — Progress Notes (Addendum)
PROGRESS NOTE    Jason Reed  [id number] DOB: [birth year] DOA: 07/12/2019 PCP: Marguerita Merles, MD   Brief Narrative:  Jason Reed  is Jason Reed 59 y.o. African-American male with Bradrick Kamau known history of hypertension, type diabetes mellitus, dyslipidemia, seizure disorder and psychosis, who presented to the emergency room, who presented to the emergency room with acute onset of generalized weakness over the last couple of days with associated altered mental status with confusion.  He admitted to urinary frequency without dysuria, oliguria or hematuria or flank pain.  He admitted to having fever and cold chills.  He denies any chest pain or dyspnea or cough.  No rhinorrhea or nasal congestion or sore throat.  No nausea or vomiting or abdominal pain.  He's been admitted for treatment of sepsis 2/2 e. Coli bacteremia in setting of UTI.  Assessment & Plan:   Active Problems:   Sepsis ([place])  1.  Sepsis  E. Coli Bacteremia likely 2/2 Urinary Tract Infection - continues to have intermittent fevers - Continue ceftriaxone 2 g daily - urine culture multiple species - blood culture with e. Coli resistant to ampicillin and unasyn - CT abd/pelvis showed L hydroureteronephrosis, which appears chronic to some degree, in setting of infection, will discuss with urology - repeat US with moderate L hydro - urology recommending foley catheter, repeat US, aggressive bowel regimen - c/s urology immediately if he decompensates - Significantly elevated procalcitonin, CXR with poor inspiration, patchy bilateral lung densities 2/2 poor inspiration vs pneumonia (in setting of above, suspect poor inspiration) - Continue IVF  2.  Acute kidney injury. - baseline renal function 1.27 in 04/2019 - on presentation 1.92 - 1.59 today, continue IVF  # Generalized Weakness  Acute Metabolic Encephalopathy: 2/2 sepsis above, follow with treatment with IV abx - PT/OT  4.  Type 2 diabetes mellitus. - A1c is 5.9 - SSI,  metformin on hold  5.  Psychotic disorder. - Continue home klonopin - abilify and seroquel currently on hold with his encephalopathy on presentation, follow for resumption  6.  Seizure disorder. -We will continue Dilantin (11.6, level).  7.  Dyslipidemia. -We will continue statin therapy.  8.  BPH. -We will continue Flomax.  # Constipation: imaging suggestive of fecal impaction in setting of chronic colonic dysmotility.  Per nursing, pt had large BM 3/11 AM.  No significant impaction noted after this on exam per nursing.  Continue bowel regimen.  # Thrombocytopenia: will continue to monitor, resolved, possibly related to sepsis  DVT prophylaxis: lovenox Code Status: full Family Communication: none at bedside - mrs yancy Disposition Plan:  . Patient came from: home            . Anticipated d/c place: home . Barriers to d/c OR conditions which need to be met to effect [facility] safe d/c: pending sensitivities for e. Coli bacteremia, further improvement from sepsis   Consultants:   none  Procedures:   none  Antimicrobials: Anti-infectives (From admission, onward)   Start     Dose/Rate Route Frequency Ordered Stop   07/13/19 0100  cefTRIAXone (ROCEPHIN) 2 g in sodium chloride 0.9 % 100 mL IVPB     2 g 200 mL/hr over 30 Minutes Intravenous Every 24 hours 07/12/19 2216     07/12/19 1615  ceFEPIme (MAXIPIME) 2 g in sodium chloride 0.9 % 100 mL IVPB     2 g 200 mL/hr over 30 Minutes Intravenous  Once 07/12/19 1612 07/12/19 1824   07/12/19 1615  metroNIDAZOLE (FLAGYL) IVPB  500 mg     500 mg 100 mL/hr over 60 Minutes Intravenous  Once 07/12/19 1612 07/12/19 1942   07/12/19 1615  vancomycin (VANCOCIN) IVPB 1000 mg/200 mL premix     1,000 mg 200 mL/hr over 60 Minutes Intravenous  Once 07/12/19 1612 07/12/19 1941      Subjective: Thankful for care No new complaints today  Objective: Vitals:   07/14/19 1030 07/14/19 2300 07/15/19 0000 07/15/19 1317  BP:   135/60 126/76   Pulse:  86 87 77  Resp:  _0 Temp: 98.3 F (36.8 C) 99 F (37.2 C)  98.5 F (36.9 C)  TempSrc: Oral  Oral Oral  SpO2:  99% 97% 100%  Weight:      Height:        Intake/Output Summary (Last 24 hours) at 07/15/2019 1647 Last data filed at 07/15/2019 1025 Gross per 24 hour  Intake 1764.29 ml  Output 700 ml  Net 1064.29 ml   Filed Weights   07/12/19 1457  Weight: 99.8 kg    Examination:  General: No acute distress. Cardiovascular: Heart sounds show Charls Custer regular rate, and rhythm. Lungs: Clear to auscultation bilaterally  Abdomen: Soft, nontender, nondistended Neurological: Alert and oriented 3. Moves all extremities 4 . Cranial nerves II through XII grossly intact. Skin: Warm and dry. No rashes or lesions. Extremities: No clubbing or cyanosis. No edema.   Data Reviewed: I have personally reviewed following labs and imaging studies  CBC: Recent Labs  Lab 07/12/19 1502 07/13/19 0550 07/14/19 0548 07/15/19 0727  WBC 13.5* 11.4* 9.2 5.8  NEUTROABS 11.2*  --  6.7 3.5  HGB 10.8* 10.1* 9.9* 9.2*  HCT 35.0* 32.1* 31.5* 29.4*  MCV 93.3 92.0 90.3 91.0  PLT 125* 113* 130* 092   Basic Metabolic Panel: Recent Labs  Lab 07/12/19 1502 07/13/19 0550 07/14/19 0548 07/15/19 0727  NA 137 138 137 138  K 4.0 3.7 3.2* 3.5  CL 99 103 104 104  CO2 _1 GLUCOSE 202* 154* 122* 116*  BUN 26* 24* 20 23*  CREATININE 1.92* 1.77* 1.83* 1.59*  CALCIUM 8.7* 8.1* 8.0* 8.0*  MG  --   --  2.0 2.2  PHOS  --   --  2.9 2.7   GFR: Estimated Creatinine Clearance: 60.4 mL/min (Clara Herbison) (by C-G formula based on SCr of 1.59 mg/dL (H)). Liver Function Tests: Recent Labs  Lab 07/12/19 1502 07/14/19 0548 07/15/19 0727  AST _2 ALT _3 ALKPHOS 88 69 70  BILITOT 1.1 1.1 0.9  PROT 7.7 6.5 6.3*  ALBUMIN 3.6 2.8* 2.7*   No results for input(s): LIPASE, AMYLASE in the last 168 hours. No results for input(s): AMMONIA in the last 168 hours. Coagulation Profile: Recent  Labs  Lab 07/12/19 1502 07/13/19 0550  INR 1.0 1.1   Cardiac Enzymes: No results for input(s): CKTOTAL, CKMB, CKMBINDEX, TROPONINI in the last 168 hours. BNP (last 3 results) No results for input(s): PROBNP in the last 8760 hours. HbA1C: Recent Labs    07/13/19 0550  HGBA1C 5.9*   CBG: Recent Labs  Lab 07/14/19 0753 07/14/19 1139 07/14/19 1717 07/14/19 2128 07/15/19 1119  GLUCAP 116* 140* 154* 140* 138*   Lipid Profile: No results for input(s): CHOL, HDL, LDLCALC, TRIG, CHOLHDL, LDLDIRECT in the last 72 hours. Thyroid Function Tests: No results for input(s): TSH, T4TOTAL, FREET4, T3FREE, THYROIDAB in the last 72 hours. Anemia Panel: No results for input(s): VITAMINB12, FOLATE, FERRITIN,  TIBC, IRON, RETICCTPCT in the last 72 hours. Sepsis Labs: Recent Labs  Lab 07/12/19 1502 07/12/19 1818 07/13/19 0550  PROCALCITON  --   --  11.73  LATICACIDVEN 2.3* 1.6  --     Recent Results (from the past 240 hour(s))  Culture, blood (Routine x 2)     Status: Abnormal   Collection Time: 07/12/19  3:02 PM   Specimen: BLOOD RIGHT HAND  Result Value Ref Range Status   Specimen Description BLOOD RIGHT HAND  Final   Special Requests   Final    BOTTLES DRAWN AEROBIC AND ANAEROBIC Blood Culture adequate volume Performed at [facility], [address] [place],  [place] [address]    Culture  Setup Time   Final    GRAM NEGATIVE RODS IN BOTH AEROBIC AND ANAEROBIC BOTTLES CRITICAL RESULT CALLED TO, READ BACK BY AND VERIFIED WITH: Rito Ehrlich AT [id number] 07/13/19 SDR Performed at [facility], [place] [address], [place], [place] [address]    Culture ESCHERICHIA COLI (Zeke Aker)  Final   Report Status 07/15/2019 FINAL  Final   Organism ID, Bacteria ESCHERICHIA COLI  Final      Susceptibility   Escherichia coli - MIC*    AMPICILLIN >=32 RESISTANT Resistant     CEFAZOLIN <=4 SENSITIVE Sensitive     CEFEPIME <=0.12 SENSITIVE Sensitive     CEFTAZIDIME <=1 SENSITIVE Sensitive      CEFTRIAXONE <=0.25 SENSITIVE Sensitive     CIPROFLOXACIN 0.5 SENSITIVE Sensitive     GENTAMICIN <=1 SENSITIVE Sensitive     IMIPENEM <=0.25 SENSITIVE Sensitive     TRIMETH/SULFA <=20 SENSITIVE Sensitive     AMPICILLIN/SULBACTAM >=32 RESISTANT Resistant     PIP/TAZO <=4 SENSITIVE Sensitive     * ESCHERICHIA COLI  Blood Culture ID Panel (Reflexed)     Status: Abnormal   Collection Time: 07/12/19  3:02 PM  Result Value Ref Range Status   Enterococcus species NOT DETECTED NOT DETECTED Final   Listeria monocytogenes NOT DETECTED NOT DETECTED Final   Staphylococcus species NOT DETECTED NOT DETECTED Final   Staphylococcus aureus (BCID) NOT DETECTED NOT DETECTED Final   Streptococcus species NOT DETECTED NOT DETECTED Final   Streptococcus agalactiae NOT DETECTED NOT DETECTED Final   Streptococcus pneumoniae NOT DETECTED NOT DETECTED Final   Streptococcus pyogenes NOT DETECTED NOT DETECTED Final   Acinetobacter baumannii NOT DETECTED NOT DETECTED Final   Enterobacteriaceae species DETECTED (Jamarco Zaldivar) NOT DETECTED Final    Comment: Enterobacteriaceae represent Donnika Kucher large family of gram-negative bacteria, not Felicita Nuncio single organism. CRITICAL RESULT CALLED TO, READ BACK BY AND VERIFIED WITH:  Rito Ehrlich AT [id number] 07/13/19 SDR    Enterobacter cloacae complex NOT DETECTED NOT DETECTED Final   Escherichia coli DETECTED (Kynslei Art) NOT DETECTED Final    Comment: CRITICAL RESULT CALLED TO, READ BACK BY AND VERIFIED WITH:  Rito Ehrlich AT 6834 07/13/19 SDR    Klebsiella oxytoca NOT DETECTED NOT DETECTED Final   Klebsiella pneumoniae NOT DETECTED NOT DETECTED Final   Proteus species NOT DETECTED NOT DETECTED Final   Serratia marcescens NOT DETECTED NOT DETECTED Final   Carbapenem resistance NOT DETECTED NOT DETECTED Final   Haemophilus influenzae NOT DETECTED NOT DETECTED Final   Neisseria meningitidis NOT DETECTED NOT DETECTED Final   Pseudomonas aeruginosa NOT DETECTED NOT DETECTED Final   Candida albicans NOT DETECTED  NOT DETECTED Final   Candida glabrata NOT DETECTED NOT DETECTED Final   Candida krusei NOT DETECTED NOT DETECTED Final   Candida parapsilosis NOT DETECTED  NOT DETECTED Final   Candida tropicalis NOT DETECTED NOT DETECTED Final    Comment: Performed at [facility], [address] [place], [place] [address]  Culture, blood (Routine x 2)     Status: Abnormal   Collection Time: 07/12/19  3:05 PM   Specimen: BLOOD RIGHT HAND  Result Value Ref Range Status   Specimen Description BLOOD RIGHT HAND  Final   Special Requests   Final    BOTTLES DRAWN AEROBIC AND ANAEROBIC Blood Culture results may not be optimal due to an inadequate volume of blood received in culture bottles Performed at [facility], [address], [place], [place] [address]    Culture  Setup Time   Final    GRAM NEGATIVE RODS ANAEROBIC BOTTLE ONLY CRITICAL VALUE NOTED.  VALUE IS CONSISTENT WITH PREVIOUSLY REPORTED AND CALLED VALUE. Performed at [facility], [address] [place], [place] [address]    Culture (Nahjae Hoeg)  Final    ESCHERICHIA COLI SUSCEPTIBILITIES PERFORMED ON PREVIOUS CULTURE WITHIN THE LAST 5 DAYS. Performed at [facility], [place] [address] [place], [place] [address]    Report Status 07/15/2019 FINAL  Final  Urine culture     Status: Abnormal   Collection Time: 07/12/19  6:17 PM   Specimen: In/Out Cath Urine  Result Value Ref Range Status   Specimen Description   Final    IN/OUT CATH URINE Performed at [facility], [address], [place], [place] [address]    Special Requests   Final    NONE Performed at [facility], [address] [place], [place] [address]    Culture MULTIPLE SPECIES PRESENT, SUGGEST RECOLLECTION (Stephaine Breshears)  Final   Report Status 07/13/2019 FINAL  Final  SARS CORONAVIRUS 2 (TAT 6-24 HRS) Nasopharyngeal Nasopharyngeal Swab     Status: None   Collection Time: 07/12/19  7:43 PM   Specimen: Nasopharyngeal Swab  Result Value Ref  Range Status   SARS Coronavirus 2 NEGATIVE NEGATIVE Final    Comment: (NOTE) SARS-CoV-2 target nucleic acids are NOT DETECTED. The SARS-CoV-2 RNA is generally detectable in upper and lower respiratory specimens during the acute phase of infection. Negative results do not preclude SARS-CoV-2 infection, do not rule out co-infections with other pathogens, and should not be used as the sole basis for treatment or other patient management decisions. Negative results must be combined with clinical observations, patient history, and epidemiological information. The expected result is Negative. Fact Sheet for Patients: SugarRoll.be Fact Sheet for Healthcare Providers: https://www.woods-mathews.com/ This test is not yet approved or cleared by the [place] FDA and  has been authorized for detection and/or diagnosis of SARS-CoV-2 by FDA under an Emergency Use Authorization (EUA). This EUA will remain  in effect (meaning this test can be used) for the duration of the COVID-19 declaration under Section 56 4(b)(1) of the Act, 21 U.S.C. section 360bbb-3(b)(1), unless the authorization is terminated or revoked sooner. Performed at [facility], [place] [address] [place], [place] [address]          Radiology Studies: US RENAL  Result Date: 07/14/2019 CLINICAL DATA:  Hydronephrosis EXAM: RENAL / URINARY TRACT ULTRASOUND COMPLETE COMPARISON:  CT 07/12/2019 FINDINGS: Right Kidney: Absent Left Kidney: Renal measurements: 13.2 x 7.1 x 6 cm = volume: 294.6 mL. Cortical echogenicity normal. Moderate left hydronephrosis. No mass. Bladder: Foley catheter within the empty urinary bladder Other: None. IMPRESSION: 1. Absent right kidney 2. Moderate left hydronephrosis Electronically Signed   By:  Donavan Foil M.D.   On: 07/14/2019 16:57        Scheduled Meds: . aspirin EC  81 mg Oral Daily  . Chlorhexidine Gluconate Cloth  6 each Topical Daily  .  clonazePAM  1 mg Oral QHS  . enoxaparin (LOVENOX) injection  40 mg Subcutaneous Q24H  . gabapentin  300 mg Oral TID  . insulin aspart  0-9 Units Subcutaneous TID PC & HS  . loratadine  10 mg Oral Daily  . mirtazapine  15 mg Oral QHS  . phenytoin  100 mg Oral q AM   And  . phenytoin  300 mg Oral QPM  . polyethylene glycol  17 g Oral BID  . psyllium  1 packet Oral Daily  . senna-docusate  2 tablet Oral QHS  . simvastatin  20 mg Oral QHS  . tamsulosin  0.4 mg Oral Daily   Continuous Infusions: . sodium chloride Stopped (07/15/19 0323)  . cefTRIAXone (ROCEPHIN)  IV Stopped (07/15/19 0800)  . lactated ringers 125 mL/hr at 07/15/19 0345     LOS: 3 days   Time spent: over 62 min    Fayrene Helper, MD [facility]   To contact the attending provider between 7A-7P or the covering provider during after hours 7P-7A, please log into the web site [email or web address] and access using universal [place] password for that web site. If you do not have the password, please call the hospital operator.  07/15/2019, 4:47 PM

## 2019-07-16 DIAGNOSIS — B9620 Unspecified Escherichia coli [E. coli] as the cause of diseases classified elsewhere: Secondary | ICD-10-CM

## 2019-07-16 DIAGNOSIS — R7881 Bacteremia: Secondary | ICD-10-CM

## 2019-07-16 LAB — COMPREHENSIVE METABOLIC PANEL
ALT: 17 U/L (ref 0–44)
AST: 20 U/L (ref 15–41)
Albumin: 2.5 g/dL — ABNORMAL LOW (ref 3.5–5.0)
Alkaline Phosphatase: 75 U/L (ref 38–126)
Anion gap: 10 (ref 5–15)
BUN: 21 mg/dL — ABNORMAL HIGH (ref 6–20)
CO2: 23 mmol/L (ref 22–32)
Calcium: 8.1 mg/dL — ABNORMAL LOW (ref 8.9–10.3)
Chloride: 105 mmol/L (ref 98–111)
Creatinine, Ser: 1.44 mg/dL — ABNORMAL HIGH (ref 0.61–1.24)
GFR calc Af Amer: >60 mL/min (ref >60)
GFR calc non Af Amer: 52 mL/min — ABNORMAL LOW (ref >60)
Glucose, Bld: 128 mg/dL — ABNORMAL HIGH (ref 70–99)
Potassium: 3.5 mmol/L (ref 3.5–5.1)
Sodium: 138 mmol/L (ref 135–145)
Total Bilirubin: 0.6 mg/dL (ref 0.3–1.2)
Total Protein: 6.2 g/dL — ABNORMAL LOW (ref 6.5–8.1)

## 2019-07-16 LAB — CBC WITH DIFFERENTIAL/PLATELET
Abs Immature Granulocytes: 0.02 10*3/uL (ref 0.00–0.07)
Basophils Absolute: 0 10*3/uL (ref 0.0–0.1)
Basophils Relative: 1 %
Eosinophils Absolute: 0.2 10*3/uL (ref 0.0–0.5)
Eosinophils Relative: 4 %
HCT: 29.5 % — ABNORMAL LOW (ref 39.0–52.0)
Hemoglobin: 9.3 g/dL — ABNORMAL LOW (ref 13.0–17.0)
Immature Granulocytes: 0 %
Lymphocytes Relative: 26 %
Lymphs Abs: 1.2 10*3/uL (ref 0.7–4.0)
MCH: 28.4 pg (ref 26.0–34.0)
MCHC: 31.5 g/dL (ref 30.0–36.0)
MCV: 89.9 fL (ref 80.0–100.0)
Monocytes Absolute: 0.5 10*3/uL (ref 0.1–1.0)
Monocytes Relative: 11 %
Neutro Abs: 2.7 10*3/uL (ref 1.7–7.7)
Neutrophils Relative %: 58 %
Platelets: 169 10*3/uL (ref 150–400)
RBC: 3.28 MIL/uL — ABNORMAL LOW (ref 4.22–5.81)
RDW: 14 % (ref 11.5–15.5)
WBC: 4.6 10*3/uL (ref 4.0–10.5)
nRBC: 0 % (ref 0.0–0.2)

## 2019-07-16 LAB — GLUCOSE, CAPILLARY
Glucose-Capillary: 143 mg/dL — ABNORMAL HIGH (ref 70–99)
Glucose-Capillary: 137 mg/dL — ABNORMAL HIGH (ref 70–99)
Glucose-Capillary: 159 mg/dL — ABNORMAL HIGH (ref 70–99)
Glucose-Capillary: 176 mg/dL — ABNORMAL HIGH (ref 70–99)

## 2019-07-16 LAB — PHOSPHORUS: Phosphorus: 2.9 mg/dL (ref 2.5–4.6)

## 2019-07-16 LAB — MAGNESIUM: Magnesium: 1.9 mg/dL (ref 1.7–2.4)

## 2019-07-16 MED ORDER — CEFAZOLIN SODIUM-DEXTROSE 2-4 GM/100ML-% IV SOLN
2.0000 g | Freq: Three times a day (TID) | INTRAVENOUS | Status: DC
  Administered 2019-07-16 – 2019-07-17 (×3): 2 g via INTRAVENOUS
  Filled 2019-07-16 (×7): qty 100

## 2019-07-16 MED ORDER — QUETIAPINE FUMARATE 200 MG PO TABS
800.0000 mg | ORAL_TABLET | Freq: Every day | ORAL | Status: DC
  Administered 2019-07-16: 800 mg via ORAL
  Filled 2019-07-16 (×2): qty 4

## 2019-07-16 MED ORDER — ARIPIPRAZOLE 15 MG PO TABS
15.0000 mg | ORAL_TABLET | Freq: Every day | ORAL | Status: DC
  Administered 2019-07-16 – 2019-07-17 (×3): 15 mg via ORAL
  Filled 2019-07-16 (×2): qty 1

## 2019-07-16 NOTE — Plan of Care (Signed)
Discussed plan of care with patient. He is not able to verbalize understanding of plan of care secondary to baseline developmental disabilities. Will continue to provide reinforcement of plan of care with each interaction

## 2019-07-16 NOTE — Consult Note (Signed)
Pharmacy Antibiotic Note  Jason Reed is a 59 y.o. malewith a history of hypertension, type diabetes mellitus, dyslipidemia, seizure disorder and psychosis, who presented to the emergency room, who presented to the emergency room with acute onset of generalized weakness associated with E coli bacteremia.  Pharmacy has been consulted for cefazolin dosing.  Plan:  Start cefazolin 2 grams IV every 8 hours  Height: 6' (182.9 cm) Weight: 220 lb (99.8 kg) IBW/kg (Calculated) : 77.6  Temp (24hrs), Avg:98.2 F (36.8 C), Min:97.9 F (36.6 C), Max:98.5 F (36.9 C)  Recent Labs  Lab 07/12/19 1502 07/12/19 1818 07/13/19 0550 07/14/19 0548 07/15/19 0727 07/16/19 0641  WBC 13.5*  --  11.4* 9.2 5.8 4.6  CREATININE 1.92*  --  1.77* 1.83* 1.59*  --   LATICACIDVEN 2.3* 1.6  --   --   --   --     Estimated Creatinine Clearance: 60.4 mL/min (A) (by C-G formula based on SCr of 1.59 mg/dL (H)).    Antimicrobials this admission: ceftriaxone 3/11 >> 3/14 cefazolin 3/14 >>   Microbiology results: 3/10 BCx: 3/4 bottles E coli 3/10 UCx: contaminated  3/10 SARS CoV-2: negative   Thank you for allowing pharmacy to be a part of this patient's care.  Dallie Piles 07/16/2019 7:58 AM

## 2019-07-16 NOTE — Progress Notes (Signed)
PROGRESS NOTE    Jason Reed  KYH:[id number] DOB: 21-Aug-1960 DOA: 07/12/2019 PCP: Marguerita Merles, MD   Brief Narrative:  Jason Reed  is Jason Reed 59 y.o. African-American male with Shakenya Stoneberg known history of hypertension, type diabetes mellitus, dyslipidemia, seizure disorder and psychosis, who presented to the emergency room, who presented to the emergency room with acute onset of generalized weakness over the last couple of days with associated altered mental status with confusion.  He admitted to urinary frequency without dysuria, oliguria or hematuria or flank pain.  He admitted to having fever and cold chills.  He denies any chest pain or dyspnea or cough.  No rhinorrhea or nasal congestion or sore throat.  No nausea or vomiting or abdominal pain.  He's been admitted for treatment of sepsis 2/2 e. Coli bacteremia in setting of UTI.  Assessment & Plan:   Active Problems:   Sepsis ([place])   Generalized weakness  1.  Sepsis  E. Coli Bacteremia likely 2/2 Urinary Tract Infection - continues to have intermittent fevers - ceftriaxone 2 g daily 3/10-3/13.  Ancef 3/14 - present. - urine culture multiple species - blood culture with e. Coli resistant to ampicillin and unasyn - CT abd/pelvis showed L hydroureteronephrosis, which appears chronic to some degree, in setting of infection, will discuss with urology - repeat US with moderate L hydro - urology recommending foley catheter, repeat US, aggressive bowel regimen - c/s urology immediately if he decompensates - Significantly elevated procalcitonin, CXR with poor inspiration, patchy bilateral lung densities 2/2 poor inspiration vs pneumonia (in setting of above, suspect poor inspiration) - Continue IVF  2.  Acute kidney injury. - baseline renal function 1.27 in 04/2019 - on presentation 1.92 - 1.44 today, continue IVF  # Generalized Weakness  Acute Metabolic Encephalopathy: 2/2 sepsis above, follow with treatment with IV abx - PT/OT ->  recommending SNF  4.  Type 2 diabetes mellitus. - A1c is 5.9 - SSI, metformin on hold  5.  Psychotic disorder. - Continue home klonopin - will restart abilify and seroquel  6.  Seizure disorder. -We will continue Dilantin (11.6, level).  7.  Dyslipidemia. -We will continue statin therapy.  8.  BPH. -We will continue Flomax.  # Constipation: imaging suggestive of fecal impaction in setting of chronic colonic dysmotility.  Per nursing, pt had large BM 3/11 AM.  No significant impaction noted after this on exam per nursing.  Continue bowel regimen.  # Thrombocytopenia: will continue to monitor, resolved, possibly related to sepsis  DVT prophylaxis: lovenox Code Status: full Family Communication: none at bedside - mrs yancy Disposition Plan:  . Patient came from: home            . Anticipated d/c place: home . Barriers to d/c OR conditions which need to be met to effect Aedon Deason safe d/c: pending sensitivities for e. Coli bacteremia, further improvement from sepsis   Consultants:   none  Procedures:   none  Antimicrobials: Anti-infectives (From admission, onward)   Start     Dose/Rate Route Frequency Ordered Stop   07/16/19 1000  ceFAZolin (ANCEF) IVPB 2g/100 mL premix     2 g 200 mL/hr over 30 Minutes Intravenous Every 8 hours 07/16/19 0803     07/13/19 0100  cefTRIAXone (ROCEPHIN) 2 g in sodium chloride 0.9 % 100 mL IVPB  Status:  Discontinued     2 g 200 mL/hr over 30 Minutes Intravenous Every 24 hours 07/12/19 2216 07/16/19 0803   07/12/19 1615  ceFEPIme (  MAXIPIME) 2 g in sodium chloride 0.9 % 100 mL IVPB     2 g 200 mL/hr over 30 Minutes Intravenous  Once 07/12/19 1612 07/12/19 1824   07/12/19 1615  metroNIDAZOLE (FLAGYL) IVPB 500 mg     500 mg 100 mL/hr over 60 Minutes Intravenous  Once 07/12/19 1612 07/12/19 1942   07/12/19 1615  vancomycin (VANCOCIN) IVPB 1000 mg/200 mL premix     1,000 mg 200 mL/hr over 60 Minutes Intravenous  Once 07/12/19 1612 07/12/19  1941      Subjective: No new complaints  Objective: Vitals:   07/15/19 1300 07/15/19 1317 07/15/19 1400 07/16/19 0023  BP:  126/76  (!) 149/82  Pulse: 79 77 74 84  Resp: _0 Temp:  98.5 F (36.9 C)  97.9 F (36.6 C)  TempSrc:  Oral  Oral  SpO2: 100% 100% 100% 100%  Weight:      Height:        Intake/Output Summary (Last 24 hours) at 07/16/2019 1318 Last data filed at 07/16/2019 0546 Gross per 24 hour  Intake 240 ml  Output 1500 ml  Net -1260 ml   Filed Weights   07/12/19 1457  Weight: 99.8 kg    Examination:  General: No acute distress. Cardiovascular: Heart sounds show Lissette Schenk regular rate, and rhythm.  Lungs: Clear to auscultation bilaterally  Abdomen: Soft, nontender, nondistended  Neurological: Alert and oriented 3. Moves all extremities 4. Cranial nerves II through XII grossly intact. Skin: Warm and dry. No rashes or lesions. Extremities: No clubbing or cyanosis. No edema.   Data Reviewed: I have personally reviewed following labs and imaging studies  CBC: Recent Labs  Lab 07/12/19 1502 07/13/19 0550 07/14/19 0548 07/15/19 0727 07/16/19 0641  WBC 13.5* 11.4* 9.2 5.8 4.6  NEUTROABS 11.2*  --  6.7 3.5 2.7  HGB 10.8* 10.1* 9.9* 9.2* 9.3*  HCT 35.0* 32.1* 31.5* 29.4* 29.5*  MCV 93.3 92.0 90.3 91.0 89.9  PLT 125* 113* 130* 159 502   Basic Metabolic Panel: Recent Labs  Lab 07/12/19 1502 07/13/19 0550 07/14/19 0548 07/15/19 0727 07/16/19 0641  NA 137 138 137 138 138  K 4.0 3.7 3.2* 3.5 3.5  CL 99 103 104 104 105  CO2 _1 GLUCOSE 202* 154* 122* 116* 128*  BUN 26* 24* 20 23* 21*  CREATININE 1.92* 1.77* 1.83* 1.59* 1.44*  CALCIUM 8.7* 8.1* 8.0* 8.0* 8.1*  MG  --   --  2.0 2.2 1.9  PHOS  --   --  2.9 2.7 2.9   GFR: Estimated Creatinine Clearance: 66.7 mL/min (Kaydie Petsch) (by C-G formula based on SCr of 1.44 mg/dL (H)). Liver Function Tests: Recent Labs  Lab 07/12/19 1502 07/14/19 0548 07/15/19 0727 07/16/19 0641  AST _2 ALT _3 ALKPHOS 88 69 70 75  BILITOT 1.1 1.1 0.9 0.6  PROT 7.7 6.5 6.3* 6.2*  ALBUMIN 3.6 2.8* 2.7* 2.5*   No results for input(s): LIPASE, AMYLASE in the last 168 hours. No results for input(s): AMMONIA in the last 168 hours. Coagulation Profile: Recent Labs  Lab 07/12/19 1502 07/13/19 0550  INR 1.0 1.1   Cardiac Enzymes: No results for input(s): CKTOTAL, CKMB, CKMBINDEX, TROPONINI in the last 168 hours. BNP (last 3 results) No results for input(s): PROBNP in the last 8760 hours. HbA1C: No results for input(s): HGBA1C in the last 72 hours. CBG: Recent Labs  Lab 07/15/19 1119 07/15/19  1656 07/15/19 2146 07/16/19 0833 07/16/19 1217  GLUCAP 138* 139* 156* 143* 137*   Lipid Profile: No results for input(s): CHOL, HDL, LDLCALC, TRIG, CHOLHDL, LDLDIRECT in the last 72 hours. Thyroid Function Tests: No results for input(s): TSH, T4TOTAL, FREET4, T3FREE, THYROIDAB in the last 72 hours. Anemia Panel: No results for input(s): VITAMINB12, FOLATE, FERRITIN, TIBC, IRON, RETICCTPCT in the last 72 hours. Sepsis Labs: Recent Labs  Lab 07/12/19 1502 07/12/19 1818 07/13/19 0550  PROCALCITON  --   --  11.73  LATICACIDVEN 2.3* 1.6  --     Recent Results (from the past 240 hour(s))  Culture, blood (Routine x 2)     Status: Abnormal   Collection Time: 07/12/19  3:02 PM   Specimen: BLOOD RIGHT HAND  Result Value Ref Range Status   Specimen Description BLOOD RIGHT HAND  Final   Special Requests   Final    BOTTLES DRAWN AEROBIC AND ANAEROBIC Blood Culture adequate volume Performed at [facility], [address] [place], [place] [address]    Culture  Setup Time   Final    GRAM NEGATIVE RODS IN BOTH AEROBIC AND ANAEROBIC BOTTLES CRITICAL RESULT CALLED TO, READ BACK BY AND VERIFIED WITH: Rito Ehrlich AT 4707 07/13/19 SDR Performed at [facility], [place] [address] [place], [place] [address]    Culture ESCHERICHIA COLI (Reann Dobias)  Final   Report Status  07/15/2019 FINAL  Final   Organism ID, Bacteria ESCHERICHIA COLI  Final      Susceptibility   Escherichia coli - MIC*    AMPICILLIN >=32 RESISTANT Resistant     CEFAZOLIN <=4 SENSITIVE Sensitive     CEFEPIME <=0.12 SENSITIVE Sensitive     CEFTAZIDIME <=1 SENSITIVE Sensitive     CEFTRIAXONE <=0.25 SENSITIVE Sensitive     CIPROFLOXACIN 0.5 SENSITIVE Sensitive     GENTAMICIN <=1 SENSITIVE Sensitive     IMIPENEM <=0.25 SENSITIVE Sensitive     TRIMETH/SULFA <=20 SENSITIVE Sensitive     AMPICILLIN/SULBACTAM >=32 RESISTANT Resistant     PIP/TAZO <=4 SENSITIVE Sensitive     * ESCHERICHIA COLI  Blood Culture ID Panel (Reflexed)     Status: Abnormal   Collection Time: 07/12/19  3:02 PM  Result Value Ref Range Status   Enterococcus species NOT DETECTED NOT DETECTED Final   Listeria monocytogenes NOT DETECTED NOT DETECTED Final   Staphylococcus species NOT DETECTED NOT DETECTED Final   Staphylococcus aureus (BCID) NOT DETECTED NOT DETECTED Final   Streptococcus species NOT DETECTED NOT DETECTED Final   Streptococcus agalactiae NOT DETECTED NOT DETECTED Final   Streptococcus pneumoniae NOT DETECTED NOT DETECTED Final   Streptococcus pyogenes NOT DETECTED NOT DETECTED Final   Acinetobacter baumannii NOT DETECTED NOT DETECTED Final   Enterobacteriaceae species DETECTED (Elveria Lauderbaugh) NOT DETECTED Final    Comment: Enterobacteriaceae represent Adaliz Dobis large family of gram-negative bacteria, not Chevon Laufer single organism. CRITICAL RESULT CALLED TO, READ BACK BY AND VERIFIED WITH:  Rito Ehrlich AT [id number] 07/13/19 SDR    Enterobacter cloacae complex NOT DETECTED NOT DETECTED Final   Escherichia coli DETECTED (Liller Yohn) NOT DETECTED Final    Comment: CRITICAL RESULT CALLED TO, READ BACK BY AND VERIFIED WITH:  Rito Ehrlich AT [id number] 07/13/19 SDR    Klebsiella oxytoca NOT DETECTED NOT DETECTED Final   Klebsiella pneumoniae NOT DETECTED NOT DETECTED Final   Proteus species NOT DETECTED NOT DETECTED Final   Serratia marcescens NOT  DETECTED NOT DETECTED Final   Carbapenem resistance NOT DETECTED NOT DETECTED Final  Haemophilus influenzae NOT DETECTED NOT DETECTED Final   Neisseria meningitidis NOT DETECTED NOT DETECTED Final   Pseudomonas aeruginosa NOT DETECTED NOT DETECTED Final   Candida albicans NOT DETECTED NOT DETECTED Final   Candida glabrata NOT DETECTED NOT DETECTED Final   Candida krusei NOT DETECTED NOT DETECTED Final   Candida parapsilosis NOT DETECTED NOT DETECTED Final   Candida tropicalis NOT DETECTED NOT DETECTED Final    Comment: Performed at [facility], [address] [place], [place] [address]  Culture, blood (Routine x 2)     Status: Abnormal   Collection Time: 07/12/19  3:05 PM   Specimen: BLOOD RIGHT HAND  Result Value Ref Range Status   Specimen Description BLOOD RIGHT HAND  Final   Special Requests   Final    BOTTLES DRAWN AEROBIC AND ANAEROBIC Blood Culture results may not be optimal due to an inadequate volume of blood received in culture bottles Performed at [facility], [address] [place], [place] [address]    Culture  Setup Time   Final    GRAM NEGATIVE RODS ANAEROBIC BOTTLE ONLY CRITICAL VALUE NOTED.  VALUE IS CONSISTENT WITH PREVIOUSLY REPORTED AND CALLED VALUE. Performed at [facility], [address] [place], [place] [address]    Culture ([place])  Final    ESCHERICHIA COLI SUSCEPTIBILITIES PERFORMED ON PREVIOUS CULTURE WITHIN THE LAST 5 DAYS. Performed at [facility], [place] [address], [place], [place] [address]    Report Status 07/15/2019 FINAL  Final  Urine culture     Status: Abnormal   Collection Time: 07/12/19  6:17 PM   Specimen: In/Out Cath Urine  Result Value Ref Range Status   Specimen Description   Final    IN/OUT CATH URINE Performed at [facility], [address] [place], [place] [address]    Special Requests   Final    NONE Performed at [facility], [address] [place], [place] [address]     Culture MULTIPLE SPECIES PRESENT, SUGGEST RECOLLECTION (Raeli Wiens)  Final   Report Status 07/13/2019 FINAL  Final  SARS CORONAVIRUS 2 (TAT 6-24 HRS) Nasopharyngeal Nasopharyngeal Swab     Status: None   Collection Time: 07/12/19  7:43 PM   Specimen: Nasopharyngeal Swab  Result Value Ref Range Status   SARS Coronavirus 2 NEGATIVE NEGATIVE Final    Comment: (NOTE) SARS-CoV-2 target nucleic acids are NOT DETECTED. The SARS-CoV-2 RNA is generally detectable in upper and lower respiratory specimens during the acute phase of infection. Negative results do not preclude SARS-CoV-2 infection, do not rule out co-infections with other pathogens, and should not be used as the sole basis for treatment or other patient management decisions. Negative results must be combined with clinical observations, patient history, and epidemiological information. The expected result is Negative. Fact Sheet for Patients: SugarRoll.be Fact Sheet for Healthcare Providers: https://www.woods-mathews.com/ This test is not yet approved or cleared by the [place] FDA and  has been authorized for detection and/or diagnosis of SARS-CoV-2 by FDA under an Emergency Use Authorization (EUA). This EUA will remain  in effect (meaning this test can be used) for the duration of the COVID-19 declaration under Section 56 4(b)(1) of the Act, 21 U.S.C. section 360bbb-3(b)(1), unless the authorization is terminated or revoked sooner. Performed at [facility], [place] [address], [place], [place] [address]          Radiology Studies: US RENAL  Result Date: 07/14/2019 CLINICAL DATA:  Hydronephrosis EXAM: RENAL /  URINARY TRACT ULTRASOUND COMPLETE COMPARISON:  CT 07/12/2019 FINDINGS: Right Kidney: Absent Left Kidney: Renal measurements: 13.2 x 7.1 x 6 cm = volume: 294.6 mL. Cortical echogenicity normal. Moderate left hydronephrosis. No mass. Bladder: Foley catheter within the empty  urinary bladder Other: None. IMPRESSION: 1. Absent right kidney 2. Moderate left hydronephrosis Electronically Signed   By: Donavan Foil M.D.   On: 07/14/2019 16:57        Scheduled Meds: . aspirin EC  81 mg Oral Daily  . Chlorhexidine Gluconate Cloth  6 each Topical Daily  . clonazePAM  1 mg Oral QHS  . enoxaparin (LOVENOX) injection  40 mg Subcutaneous Q24H  . gabapentin  300 mg Oral TID  . insulin aspart  0-9 Units Subcutaneous TID PC & HS  . loratadine  10 mg Oral Daily  . mirtazapine  15 mg Oral QHS  . phenytoin  100 mg Oral q AM   And  . phenytoin  300 mg Oral QPM  . polyethylene glycol  17 g Oral BID  . psyllium  1 packet Oral Daily  . senna-docusate  2 tablet Oral QHS  . simvastatin  20 mg Oral QHS  . tamsulosin  0.4 mg Oral Daily   Continuous Infusions: . sodium chloride 250 mL (07/16/19 0123)  .  ceFAZolin (ANCEF) IV 2 g (07/16/19 1039)  . lactated ringers 125 mL/hr at 07/15/19 2159     LOS: 4 days   Time spent: over 30 min    Fayrene Helper, MD [facility]   To contact the attending provider between 7A-7P or the covering provider during after hours 7P-7A, please log into the web site [email or web address] and access using universal  password for that web site. If you do not have the password, please call the hospital operator.  07/16/2019, 1:18 PM

## 2019-07-17 LAB — COMPREHENSIVE METABOLIC PANEL
ALT: 17 U/L (ref 0–44)
AST: 28 U/L (ref 15–41)
Albumin: 2.6 g/dL — ABNORMAL LOW (ref 3.5–5.0)
Alkaline Phosphatase: 79 U/L (ref 38–126)
Anion gap: 6 (ref 5–15)
BUN: 18 mg/dL (ref 6–20)
CO2: 27 mmol/L (ref 22–32)
Calcium: 8.3 mg/dL — ABNORMAL LOW (ref 8.9–10.3)
Chloride: 104 mmol/L (ref 98–111)
Creatinine, Ser: 1.32 mg/dL — ABNORMAL HIGH (ref 0.61–1.24)
GFR calc Af Amer: >60 mL/min (ref >60)
GFR calc non Af Amer: 58 mL/min — ABNORMAL LOW (ref >60)
Glucose, Bld: 158 mg/dL — ABNORMAL HIGH (ref 70–99)
Potassium: 4 mmol/L (ref 3.5–5.1)
Sodium: 137 mmol/L (ref 135–145)
Total Bilirubin: 0.8 mg/dL (ref 0.3–1.2)
Total Protein: 6.1 g/dL — ABNORMAL LOW (ref 6.5–8.1)

## 2019-07-17 LAB — CBC WITH DIFFERENTIAL/PLATELET
Abs Immature Granulocytes: 0.02 10*3/uL (ref 0.00–0.07)
Basophils Absolute: 0 10*3/uL (ref 0.0–0.1)
Basophils Relative: 1 %
Eosinophils Absolute: 0.1 10*3/uL (ref 0.0–0.5)
Eosinophils Relative: 3 %
HCT: 29 % — ABNORMAL LOW (ref 39.0–52.0)
Hemoglobin: 9.3 g/dL — ABNORMAL LOW (ref 13.0–17.0)
Immature Granulocytes: 1 %
Lymphocytes Relative: 27 %
Lymphs Abs: 1.2 10*3/uL (ref 0.7–4.0)
MCH: 28.5 pg (ref 26.0–34.0)
MCHC: 32.1 g/dL (ref 30.0–36.0)
MCV: 89 fL (ref 80.0–100.0)
Monocytes Absolute: 0.4 10*3/uL (ref 0.1–1.0)
Monocytes Relative: 10 %
Neutro Abs: 2.5 10*3/uL (ref 1.7–7.7)
Neutrophils Relative %: 58 %
Platelets: 177 10*3/uL (ref 150–400)
RBC: 3.26 MIL/uL — ABNORMAL LOW (ref 4.22–5.81)
RDW: 13.9 % (ref 11.5–15.5)
WBC: 4.3 10*3/uL (ref 4.0–10.5)
nRBC: 0 % (ref 0.0–0.2)

## 2019-07-17 LAB — GLUCOSE, CAPILLARY
Glucose-Capillary: 128 mg/dL — ABNORMAL HIGH (ref 70–99)
Glucose-Capillary: 137 mg/dL — ABNORMAL HIGH (ref 70–99)

## 2019-07-17 LAB — PHENYTOIN LEVEL, FREE AND TOTAL
Phenytoin, Free: 0.9 ug/mL — ABNORMAL LOW (ref 1.0–2.0)
Phenytoin, Total: 8.9 ug/mL — ABNORMAL LOW (ref 10.0–20.0)

## 2019-07-17 LAB — RESPIRATORY PANEL BY RT PCR (FLU A&B, COVID)
Influenza A by PCR: NEGATIVE
Influenza B by PCR: NEGATIVE
SARS Coronavirus 2 by RT PCR: NEGATIVE

## 2019-07-17 LAB — MAGNESIUM: Magnesium: 2 mg/dL (ref 1.7–2.4)

## 2019-07-17 LAB — PHOSPHORUS: Phosphorus: 3.8 mg/dL (ref 2.5–4.6)

## 2019-07-17 MED ORDER — POLYETHYLENE GLYCOL 3350 17 G PO PACK: 17.0000 g | PACK | Freq: Two times a day (BID) | ORAL | 0 refills | Status: AC

## 2019-07-17 MED ORDER — SULFAMETHOXAZOLE-TRIMETHOPRIM 800-160 MG PO TABS: 1.0000 | ORAL_TABLET | Freq: Two times a day (BID) | ORAL | 0 refills | Status: AC

## 2019-07-17 NOTE — Discharge Summary (Signed)
Physician Discharge Summary  Jason Reed [id number] DOB: 1961/03/06 DOA: 07/12/2019  PCP: Jason Merles, MD  Admit date: 07/12/2019 Discharge date: 07/17/2019  Time spent: 40 minutes  Recommendations for Outpatient Follow-up:  1. Follow outpatient CBC/CMP within 1-2 days 2. Continue bactrim twice daily for 10 days for the e coli bacteremia 3. Follow with urology outpatient - urology to arrange follow up with Jason Reed for cystoscopy and follow up in clinic later this week 4. Hold lasix and follow volume status and renal function - consider resumption if necessary based on volume status and renal function 5. Continue aggressive bowel regimen outpatient  6. Follow outpatient CXR  Discharge Diagnoses:  Active Problems:   Sepsis ([name])   Generalized weakness   Bacteremia due to Escherichia coli   Discharge Condition: stable  Diet recommendation: heart healthy  Filed Weights   07/12/19 1457  Weight: 99.8 kg    History of present illness:  ArthurRobinsonis a58 y.o.African-American malewith Jason Reed known history of hypertension, type diabetes mellitus, dyslipidemia, seizure disorder and psychosis, who presented to the emergency room, who presented to the emergency room with acute onset of generalized weakness over the last couple of days with associated altered mental status with confusion. He admitted to urinary frequency without dysuria, oliguria or hematuria or flank pain. He admitted to having fever and cold chills. He denies any chest pain or dyspnea or cough. No rhinorrhea or nasal congestion or sore throat. No nausea or vomiting or abdominal pain.  He's been admitted for treatment of sepsis 2/2 e. Coli bacteremia in setting of UTI.  He had imaging showing L sided hydroureteronephrosis.  Urology was consulted and recommended foley catheter, repeat US, aggressive bowel regimen.  His repeat US continued to show hydronephrosis.  Urology recommended outpatient follow up  with Jason Reed for cysto.  He's improved and was discharged with oral antibiotics  See below for additional details.  Hospital Course:  1. Sepsis  E. Coli Bacteremia likely 2/2 Urinary Tract Infection - continues to have intermittent fevers - ceftriaxone 2 g daily 3/10-3/13.  Ancef 3/14 - 3/15.  Discharge with 10 days bactrim (longer duration given persistent hydro) - urine culture multiple species - blood culture with e. Coli resistant to ampicillin and unasyn - CT abd/pelvis showed L hydroureteronephrosis, which appears chronic to some degree, in setting of infection, will discuss with urology - repeat US with moderate L hydro - urology recommending foley catheter, repeat US, aggressive bowel regimen - c/s urology immediately if he decompensates - discussed with urology prior to discharge - ok with discharge, will follow outpatient for cystoscopy with Jason Reed per Dr. Erlene Reed - Significantly elevated procalcitonin, CXR with poor inspiration, patchy bilateral lung densities 2/2 poor inspiration vs pneumonia (in setting of above, suspect poor inspiration) - Now afebrile with normalized white count  2. Acute kidney injury. - baseline renal function 1.27 in 04/2019 - on presentation 1.92 - 1.32 today, follow  # Generalized Weakness  Acute Metabolic Encephalopathy: 2/2 sepsis above, follow with treatment with IV abx - PT/OT -> recommending SNF  4. Type 2 diabetes mellitus. - A1c is 5.9 - Resume metformin  5. Psychotic disorder. - Continue home klonopin - abilify, seroquel  6. Seizure disorder. -We will continue Dilantin (11.6, level).  7. Dyslipidemia. -We will continue statin therapy.  8. BPH. -We will continue Flomax.  # Constipation: imaging suggestive of fecal impaction in setting of chronic colonic dysmotility.  Per nursing, pt had large BM 3/11 AM.  No significant  impaction noted after this on exam per nursing.  Continue bowel regimen.  #  Thrombocytopenia: will continue to monitor, resolved, possibly related to sepsis  Procedures:  none  Consultations:  urology  Discharge Exam: Vitals:   07/16/19 1526 07/17/19 0050  BP: (!) 156/73 130/76  Pulse: 83 90  Resp: 18 16  Temp: 98.6 F (37 C) 98.5 F (36.9 C)  SpO2: 97% 100%   Feeling progressively better, no complaints today Discussed with POA Jason Reed regarding plan for discharge to SNF  General: No acute distress. Cardiovascular: Heart sounds show Jason Reed regular rate, and rhythm.  Lungs: Clear to auscultation bilaterally  Abdomen: Soft, nontender, nondistended Neurological: Alert and oriented. Moves all extremities 4. Cranial nerves II through XII grossly intact. Skin: Warm and dry. No rashes or lesions. Extremities: No clubbing or cyanosis. No edema.   Discharge Instructions   Discharge Instructions    Diet - low sodium heart healthy   Complete by: As directed    Discharge instructions   Complete by: As directed    You were seen for bacteria in your blood that was likely due to Jason Reed urinary tract infection.  You've improved with IV fluids and antibiotics.  We'll discharge you with bactrim twice daily for another 10 days.  You need to follow up with urology as an outpatient for Jason Reed cystoscopy.  This will be with Jason Reed.    Return for new, recurrent, or worsening symptoms.  Please ask your PCP to request records from this hospitalization so they know what was done and what the next steps will be.   Increase activity slowly   Complete by: As directed      Allergies as of 07/17/2019      Reactions   Lisinopril Other (See Comments)   Unknown      Medication List    STOP taking these medications   furosemide 40 MG tablet Commonly known as: LASIX     TAKE these medications   acetaminophen 325 MG tablet Commonly known as: TYLENOL Take 650 mg by mouth every 6 (six) hours as needed.   ARIPiprazole 15 MG tablet Commonly known as: ABILIFY Take 15 mg  by mouth daily.   aspirin EC 81 MG tablet Take 81 mg by mouth daily.   clonazePAM 1 MG tablet Commonly known as: KLONOPIN Take 1 mg by mouth at bedtime.   Dilantin 100 MG ER capsule Generic drug: phenytoin Take 100-300 mg by mouth 2 (two) times daily. Take 100 mg by mouth in the morning and 300 mg by mouth at bedtime.   gabapentin 300 MG capsule Commonly known as: NEURONTIN Take 300 mg by mouth 3 (three) times daily.   guaiFENesin-dextromethorphan 100-10 MG/5ML syrup Commonly known as: ROBITUSSIN DM Take 10 mLs by mouth every 6 (six) hours as needed for cough.   hydrocortisone cream 1 % Apply 1 application topically 2 (two) times daily as needed.   loratadine 10 MG tablet Commonly known as: CLARITIN Take 10 mg by mouth daily.   metFORMIN 500 MG tablet Commonly known as: GLUCOPHAGE Take 500 mg by mouth 2 (two) times daily with Emie Sommerfeld meal.   mirtazapine 15 MG tablet Commonly known as: REMERON Take 15 mg by mouth at bedtime.   polyethylene glycol 17 g packet Commonly known as: MIRALAX / GLYCOLAX Take 17 g by mouth 2 (two) times daily.   potassium chloride SA 20 MEQ tablet Commonly known as: KLOR-CON Take 1 tablet (20 mEq total) by mouth daily.   psyllium  58.6 % powder Commonly known as: METAMUCIL Take 1 packet by mouth 3 (three) times daily as needed.   QUEtiapine 400 MG tablet Commonly known as: SEROQUEL Take 800 mg by mouth at bedtime.   simvastatin 20 MG tablet Commonly known as: ZOCOR Take 20 mg by mouth at bedtime.   sulfamethoxazole-trimethoprim 800-160 MG tablet Commonly known as: BACTRIM DS Take 1 tablet by mouth 2 (two) times daily for 10 days.   tamsulosin 0.4 MG Caps capsule Commonly known as: FLOMAX Take 1 capsule (0.4 mg total) by mouth daily.   traZODone 100 MG tablet Commonly known as: DESYREL Take 100 mg by mouth at bedtime.      Allergies  Allergen Reactions  . Lisinopril Other (See Comments)    Unknown   Contact information for  after-discharge care    Destination    [facility] Preferred SNF .   Service: Skilled Nursing Contact information: [address] [place] [phone]               The results of significant diagnostics from this hospitalization (including imaging, microbiology, ancillary and laboratory) are listed below for reference.    Significant Diagnostic Studies: CT ABDOMEN PELVIS W CONTRAST  Result Date: 07/12/2019 CLINICAL DATA:  Abdominal pain, distension, and fever. Weakness for 2 days. EXAM: CT ABDOMEN AND PELVIS WITH CONTRAST TECHNIQUE: Multidetector CT imaging of the abdomen and pelvis was performed using the standard protocol following bolus administration of intravenous contrast. CONTRAST:  55mL OMNIPAQUE IOHEXOL 300 MG/ML  SOLN COMPARISON:  Noncontrast CT 03/09/2019 FINDINGS: Lower chest: Streaky atelectasis at the right lung base. No pleural fluid. Coronary artery calcifications. Hepatobiliary: Decreased hepatic density consistent with steatosis. Subcentimeter cyst in the central right lobe of the liver. Gallbladder is decompressed and displaced anteriorly. No calcified gallstone. No pericholecystic inflammation. Pancreas: No ductal dilatation or inflammation. Spleen: Normal in size without focal abnormality. Adrenals/Urinary Tract: No adrenal nodule. Absent right kidney. There is left hydroureteronephrosis to the distal ureter in the pelvis. Hydronephrosis has slightly increased from prior exam. Similar perinephric edema. There is excretion in the left renal collecting system on delayed phase imaging. Urinary bladder is displaced anteriorly by stool distended rectum. No bladder wall thickening. Posterior bladder wall irregularity is unchanged. No visualized urolithiasis. Stomach/Bowel: Nondistended stomach. Small bowel is nondistended. Normal appendix. Large volume of stool throughout the colon with diffuse colonic tortuosity, similar in appearance to  prior exam. There is sigmoid and transverse colonic redundancy. No colonic wall thickening or inflammatory change. Stool distends the rectum with rectal diameter of 8.2 cm. Associated rectal wall thickening and perirectal edema no pneumatosis. Vascular/Lymphatic: Normal caliber abdominal aorta. The portal vein is patent. No bulky abdominopelvic adenopathy. Reproductive: Prominent prostate gland spans 5.7 cm. Other: Mild presacral edema related to rectal wall thickening. No ascites or free air. Small fat containing umbilical hernia. Mild generalized body wall edema. Musculoskeletal: There are no acute or suspicious osseous abnormalities. Degenerative change in the lumbar spine. IMPRESSION: 1. Colonic tortuosity and redundancy with large stool burden, similar to prior exam. Stool distended rectum with rectal wall thickening and perirectal edema. Findings suggest fecal impaction in the setting of chronic colonic dysmotility. 2. Left hydroureteronephrosis to the distal ureter, also present on November 2020, slightly increased in degree from prior exam. No obstructing stone. Cause of obstruction may be stool distended rectum. There is perinephric edema, also present on prior. Recommend correlation with urinalysis. 3. Hepatic steatosis. Electronically Signed   By: Keith Rake  M.D.   On: 07/12/2019 18:42   US RENAL  Result Date: 07/14/2019 CLINICAL DATA:  Hydronephrosis EXAM: RENAL / URINARY TRACT ULTRASOUND COMPLETE COMPARISON:  CT 07/12/2019 FINDINGS: Right Kidney: Absent Left Kidney: Renal measurements: 13.2 x 7.1 x 6 cm = volume: 294.6 mL. Cortical echogenicity normal. Moderate left hydronephrosis. No mass. Bladder: Foley catheter within the empty urinary bladder Other: None. IMPRESSION: 1. Absent right kidney 2. Moderate left hydronephrosis Electronically Signed   By: Donavan Foil M.D.   On: 07/14/2019 16:57   DG Chest Port 1 View  Result Date: 07/12/2019 CLINICAL DATA:  Fever, tachycardia and weakness.  EXAM: PORTABLE CHEST 1 VIEW COMPARISON:  04/09/2019 FINDINGS: Poor inspiration. Borderline cardiomegaly. Aortic tortuosity. Patchy bilateral lung densities that could relate to the poor inspiration or could indicate bilateral pneumonia. No dense consolidation, lobar collapse or effusion. IMPRESSION: Poor inspiration. Patchy bilateral lung densities that could relate to poor inspiration or could indicate pneumonia. No dense consolidation or lobar collapse. Electronically Signed   By: Nelson Chimes M.D.   On: 07/12/2019 16:41    Microbiology: Recent Results (from the past 240 hour(s))  Culture, blood (Routine x 2)     Status: Abnormal   Collection Time: 07/12/19  3:02 PM   Specimen: BLOOD RIGHT HAND  Result Value Ref Range Status   Specimen Description BLOOD RIGHT HAND  Final   Special Requests   Final    BOTTLES DRAWN AEROBIC AND ANAEROBIC Blood Culture adequate volume Performed at [facility], [address] [place], [place] [address]    Culture  Setup Time   Final    GRAM NEGATIVE RODS IN BOTH AEROBIC AND ANAEROBIC BOTTLES CRITICAL RESULT CALLED TO, READ BACK BY AND VERIFIED WITH: Rito Ehrlich AT [id number] 07/13/19 SDR Performed at [facility], [place] [address] [place], [place] [address]    Culture ESCHERICHIA COLI (Cyani Kallstrom)  Final   Report Status 07/15/2019 FINAL  Final   Organism ID, Bacteria ESCHERICHIA COLI  Final      Susceptibility   Escherichia coli - MIC*    AMPICILLIN >=32 RESISTANT Resistant     CEFAZOLIN <=4 SENSITIVE Sensitive     CEFEPIME <=0.12 SENSITIVE Sensitive     CEFTAZIDIME <=1 SENSITIVE Sensitive     CEFTRIAXONE <=0.25 SENSITIVE Sensitive     CIPROFLOXACIN 0.5 SENSITIVE Sensitive     GENTAMICIN <=1 SENSITIVE Sensitive     IMIPENEM <=0.25 SENSITIVE Sensitive     TRIMETH/SULFA <=20 SENSITIVE Sensitive     AMPICILLIN/SULBACTAM >=32 RESISTANT Resistant     PIP/TAZO <=4 SENSITIVE Sensitive     * ESCHERICHIA COLI  Blood Culture ID Panel (Reflexed)      Status: Abnormal   Collection Time: 07/12/19  3:02 PM  Result Value Ref Range Status   Enterococcus species NOT DETECTED NOT DETECTED Final   Listeria monocytogenes NOT DETECTED NOT DETECTED Final   Staphylococcus species NOT DETECTED NOT DETECTED Final   Staphylococcus aureus (BCID) NOT DETECTED NOT DETECTED Final   Streptococcus species NOT DETECTED NOT DETECTED Final   Streptococcus agalactiae NOT DETECTED NOT DETECTED Final   Streptococcus pneumoniae NOT DETECTED NOT DETECTED Final   Streptococcus pyogenes NOT DETECTED NOT DETECTED Final   Acinetobacter baumannii NOT DETECTED NOT DETECTED Final   Enterobacteriaceae species DETECTED (Joshawn Crissman) NOT DETECTED Final    Comment: Enterobacteriaceae represent Emmali Karow large family of gram-negative bacteria, not Lexani Corona single organism. CRITICAL RESULT CALLED TO, READ BACK BY AND VERIFIED WITH:  Rito Ehrlich AT [id number] 07/13/19 SDR  Enterobacter cloacae complex NOT DETECTED NOT DETECTED Final   Escherichia coli DETECTED (Niyana Chesbro) NOT DETECTED Final    Comment: CRITICAL RESULT CALLED TO, READ BACK BY AND VERIFIED WITH:  Rito Ehrlich AT [id number] 07/13/19 SDR    Klebsiella oxytoca NOT DETECTED NOT DETECTED Final   Klebsiella pneumoniae NOT DETECTED NOT DETECTED Final   Proteus species NOT DETECTED NOT DETECTED Final   Serratia marcescens NOT DETECTED NOT DETECTED Final   Carbapenem resistance NOT DETECTED NOT DETECTED Final   Haemophilus influenzae NOT DETECTED NOT DETECTED Final   Neisseria meningitidis NOT DETECTED NOT DETECTED Final   Pseudomonas aeruginosa NOT DETECTED NOT DETECTED Final   Candida albicans NOT DETECTED NOT DETECTED Final   Candida glabrata NOT DETECTED NOT DETECTED Final   Candida krusei NOT DETECTED NOT DETECTED Final   Candida parapsilosis NOT DETECTED NOT DETECTED Final   Candida tropicalis NOT DETECTED NOT DETECTED Final    Comment: Performed at [facility], [address] [place], [place] [address]  Culture, blood (Routine x 2)      Status: Abnormal   Collection Time: 07/12/19  3:05 PM   Specimen: BLOOD RIGHT HAND  Result Value Ref Range Status   Specimen Description BLOOD RIGHT HAND  Final   Special Requests   Final    BOTTLES DRAWN AEROBIC AND ANAEROBIC Blood Culture results may not be optimal due to an inadequate volume of blood received in culture bottles Performed at [facility], [address] [place], [place] [address]    Culture  Setup Time   Final    GRAM NEGATIVE RODS ANAEROBIC BOTTLE ONLY CRITICAL VALUE NOTED.  VALUE IS CONSISTENT WITH PREVIOUSLY REPORTED AND CALLED VALUE. Performed at [facility], [address] [place], [place] [address]    Culture (Amron Guerrette)  Final    ESCHERICHIA COLI SUSCEPTIBILITIES PERFORMED ON PREVIOUS CULTURE WITHIN THE LAST 5 DAYS. Performed at [facility], [place] [address] [place], [place] [address]    Report Status 07/15/2019 FINAL  Final  Urine culture     Status: Abnormal   Collection Time: 07/12/19  6:17 PM   Specimen: In/Out Cath Urine  Result Value Ref Range Status   Specimen Description   Final    IN/OUT CATH URINE Performed at [facility], [address] [place], [place] [address]    Special Requests   Final    NONE Performed at [facility], [address] [place], [place] [address]    Culture MULTIPLE SPECIES PRESENT, SUGGEST RECOLLECTION (Madeline Bebout)  Final   Report Status 07/13/2019 FINAL  Final  SARS CORONAVIRUS 2 (TAT 6-24 HRS) Nasopharyngeal Nasopharyngeal Swab     Status: None   Collection Time: 07/12/19  7:43 PM   Specimen: Nasopharyngeal Swab  Result Value Ref Range Status   SARS Coronavirus 2 NEGATIVE NEGATIVE Final    Comment: (NOTE) SARS-CoV-2 target nucleic acids are NOT DETECTED. The SARS-CoV-2 RNA is generally detectable in upper and lower respiratory specimens during the acute phase of infection. Negative results do not preclude SARS-CoV-2 infection, do not rule out co-infections with other pathogens,  and should not be used as the sole basis for treatment or other patient management decisions. Negative results must be combined with clinical observations, patient history, and epidemiological information. The expected result is Negative. Fact Sheet for Patients: SugarRoll.be Fact Sheet for Healthcare Providers: https://www.woods-mathews.com/ This test is not yet approved or cleared by the [place] FDA and  has been authorized for detection and/or  diagnosis of SARS-CoV-2 by FDA under an Emergency Use Authorization (EUA). This EUA will remain  in effect (meaning this test can be used) for the duration of the COVID-19 declaration under Section 56 4(b)(1) of the Act, 21 U.S.C. section 360bbb-3(b)(1), unless the authorization is terminated or revoked sooner. Performed at [facility], [place] [address] [place], [place] [address]      Labs: Basic Metabolic Panel: Recent Labs  Lab 07/13/19 0550 07/14/19 0548 07/15/19 0727 07/16/19 0641 07/17/19 0633  NA 138 137 138 138 137  K 3.7 3.2* 3.5 3.5 4.0  CL 103 104 104 105 104  CO2 25 24 23 23 27   GLUCOSE 154* 122* 116* 128* 158*  BUN 24* 20 23* 21* 18  CREATININE 1.77* 1.83* 1.59* 1.44* 1.32*  CALCIUM 8.1* 8.0* 8.0* 8.1* 8.3*  MG  --  2.0 2.2 1.9 2.0  PHOS  --  2.9 2.7 2.9 3.8   Liver Function Tests: Recent Labs  Lab 07/12/19 1502 07/14/19 0548 07/15/19 0727 07/16/19 0641 07/17/19 0633  AST 25 21 16 20 28   ALT 16 15 14 17 17   ALKPHOS 88 69 70 75 79  BILITOT 1.1 1.1 0.9 0.6 0.8  PROT 7.7 6.5 6.3* 6.2* 6.1*  ALBUMIN 3.6 2.8* 2.7* 2.5* 2.6*   No results for input(s): LIPASE, AMYLASE in the last 168 hours. No results for input(s): AMMONIA in the last 168 hours. CBC: Recent Labs  Lab 07/12/19 1502 07/12/19 1502 07/13/19 0550 07/14/19 0548 07/15/19 0727 07/16/19 0641 07/17/19 0633  WBC 13.5*   < > 11.4* 9.2 5.8 4.6 4.3  NEUTROABS 11.2*  --   --  6.7 3.5 2.7 2.5  HGB  10.8*   < > 10.1* 9.9* 9.2* 9.3* 9.3*  HCT 35.0*   < > 32.1* 31.5* 29.4* 29.5* 29.0*  MCV 93.3   < > 92.0 90.3 91.0 89.9 89.0  PLT 125*   < > 113* 130* 159 169 177   < > = values in this interval not displayed.   Cardiac Enzymes: No results for input(s): CKTOTAL, CKMB, CKMBINDEX, TROPONINI in the last 168 hours. BNP: BNP (last 3 results) No results for input(s): BNP in the last 8760 hours.  ProBNP (last 3 results) No results for input(s): PROBNP in the last 8760 hours.  CBG: Recent Labs  Lab 07/16/19 1217 07/16/19 1635 07/16/19 2157 07/17/19 0727 07/17/19 1136  GLUCAP 137* 159* 176* 128* 137*       Signed:  Fayrene Helper MD.  [facility] 07/17/2019, 2:41 PM

## 2019-07-17 NOTE — Care Management Important Message (Signed)
Important Message  Patient Details  Name: Easten Savarino MRN: [id number] Date of Birth: March 13, 1961   Medicare Important Message Given:  Yes     Juliann Pulse A Lewi Drost 07/17/2019, 12:07 PM

## 2019-07-17 NOTE — Progress Notes (Signed)
Occupational Therapy Treatment Patient Details Name: Jason Reed MRN: [id number] DOB: 1960-08-17 Today's Date: 07/17/2019    History of present illness Weldon Erlinger is a 59 y.o. year old male with solitary kidney and chronic left hydroureteronephrosis presenting to the emergency room with generalized weakness, confusion and fevers to 102.  On further evaluation, he was noted to have a positive urinalysis and blood cultures growing E. coli preliminarily. Patient found with UTI and sepsis. Pt with increased temp this date, however cleared to participate via RN staff.   OT comments  Patient supine in bed and agreeable to therapy upon entry.  Patient with no complaints of pain at this time.  Performed simple grooming tasks with MIN A (task initiation and completion) at bed level.  Patient declined sitting up, stating he has been in the recliner for some time today.  Provided education concerning discharge to [facility] and further therapy.  Patient with no further questions.  Assisted patient with positioning required MIN A for log rolling.  Patient would benefit from continued occupational therapy services to address performance components outlined in evaluation.  Based on today's performance, SNF remains appropriate recommendation.   Follow Up Recommendations  SNF    Equipment Recommendations  Other (comment)(defer to next level of care)    Recommendations for Other Services      Precautions / Restrictions Precautions Precautions: Fall Restrictions Weight Bearing Restrictions: No       Mobility Bed Mobility Overal bed mobility: Needs Assistance Bed Mobility: Rolling Rolling: Min assist   Supine to sit: Min assist     General bed mobility comments: Assisted patient with positioning in bed with MIN A for log rolling  Transfers          General transfer comment: Patient declined transfer this date stating he has been sitting up in recliner    Balance                               ADL either performed or assessed with clinical judgement   ADL Overall ADL's : Needs assistance/impaired     Grooming: Wash/dry hands;Wash/dry face;Bed level                                       Vision Patient Visual Report: No change from baseline     Perception     Praxis      Cognition Arousal/Alertness: Awake/alert Behavior During Therapy: WFL for tasks assessed/performed Overall Cognitive Status: No family/caregiver present to determine baseline cognitive functioning                                         Exercises Other Exercises Other Exercises: Completed simple grooming tasks at bed level this date with MIN A for task initiation/completion Other Exercises: Completed log rolling for positioning this date using bed rails and MIN A   Shoulder Instructions       General Comments     Pertinent Vitals/ Pain       Pain Assessment: No/denies pain  Home Living Family/patient expects to be discharged to:: Group home  Additional Comments: has 5 steps to enter with B rails      Prior Functioning/Environment Level of Independence: Needs assistance  Gait / Transfers Assistance Needed: pt reports ambulating with rollator ADL's / Homemaking Assistance Needed: pt reports ind with ADLs, per chart needs assist for IADLs   Comments: reports he has no difficulty with ambulating and reports no fall   Frequency  Min 1X/week        Progress Toward Goals  OT Goals(current goals can now be found in the care plan section)  Progress towards OT goals: OT to reassess next treatment     Plan Discharge plan remains appropriate;Frequency remains appropriate    Co-evaluation                 AM-PAC OT "6 Clicks" Daily Activity     Outcome Measure   Help from another person eating meals?: A Little Help from another person taking care of personal grooming?:  A Little Help from another person toileting, which includes using toliet, bedpan, or urinal?: A Lot Help from another person bathing (including washing, rinsing, drying)?: A Lot Help from another person to put on and taking off regular upper body clothing?: A Lot Help from another person to put on and taking off regular lower body clothing?: A Lot 6 Click Score: 14    End of Session    OT Visit Diagnosis: History of falling (Z91.81);Muscle weakness (generalized) (M62.81)   Activity Tolerance Patient tolerated treatment well   Patient Left in bed;with call bell/phone within reach;with bed alarm set   Nurse Communication          Time: [id number] OT Time Calculation (min): 12 min  Charges: OT General Charges $OT Visit: 1 Visit OT Treatments $Self Care/Home Management : 8-22 mins  Baldomero Lamy, MS, OTR/L 07/17/19, 3:42 PM

## 2019-07-17 NOTE — Progress Notes (Signed)
Physical Therapy Treatment Patient Details Name: Jason Reed MRN: [id number] DOB: 12/06/1960 Today's Date: 07/17/2019    History of Present Illness Jason Reed is a 59 y.o. year old male with solitary kidney and chronic left hydroureteronephrosis presenting to the emergency room with generalized weakness, confusion and fevers to 102.  On further evaluation, he was noted to have a positive urinalysis and blood cultures growing E. coli preliminarily. Patient found with UTI and sepsis. Pt with increased temp this date, however cleared to participate via RN staff.    PT Comments    Pt alert, oriented to self and place, behavior WFLs and pt able to follow all commands consistently. The patient demonstrated supine to sit with minA, and good sitting balance at EOB with verbal/visual cueing to place both feet on the floor. Sit <> stand performed several times;attempted without RW, unsuccessful. With RW and modA pt able to come to standing and take a few steps to the recliner. After seated rest break pt ambulated ~41ft with CGA and RW. Pt cued for upright posture and RW positioning, pt exhibited decreased step height/length and endorsed fatigue. The patient would benefit from further skilled PT intervention to maximize mobility, safety, and independence, SNF remains appropriate due to acute decline in functional status and level of assistance needed.     Follow Up Recommendations  SNF     Equipment Recommendations  Rolling walker with 5" wheels    Recommendations for Other Services       Precautions / Restrictions Precautions Precautions: Fall Restrictions Weight Bearing Restrictions: No    Mobility  Bed Mobility Overal bed mobility: Needs Assistance Bed Mobility: Supine to Sit     Supine to sit: Min assist     General bed mobility comments: extended time needed, physical assist to shift weight anteriorly towards EOB. once seated, pt able to sit upright, no assistance needed  (supervision)  Transfers Overall transfer level: Needs assistance Equipment used: Rolling walker (2 wheeled) Transfers: Sit to/from Stand Sit to Stand: Mod assist;Min assist;+2 safety/equipment         General transfer comment: from elevated bed, modA with RW. minAx2 with RW from recliner  Ambulation/Gait Ambulation/Gait assistance: Min guard;+2 safety/equipment(chair follow) Gait Distance (Feet): 30 Feet Assistive device: Rolling walker (2 wheeled)       General Gait Details: Pt with shuffled step; decreased step height/length bilaterally. Cued for upright posture, and proper positioning of RW.   Stairs             Wheelchair Mobility    Modified Rankin (Stroke Patients Only)       Balance Overall balance assessment: Needs assistance Sitting-balance support: Feet supported Sitting balance-Leahy Scale: Good       Standing balance-Leahy Scale: Fair                              Cognition Arousal/Alertness: Awake/alert Behavior During Therapy: WFL for tasks assessed/performed Overall Cognitive Status: No family/caregiver present to determine baseline cognitive functioning                                 General Comments: pt able to recall name, not birthday, knew he was in [place] and in the hospital. able to follow all commands      Exercises      General Comments General comments (skin integrity, edema, etc.): HR in 130s, with ambulation, pt  stated he was tired and wanted to sit      Pertinent Vitals/Pain Pain Assessment: No/denies pain    Home Living                      Prior Function            PT Goals (current goals can now be found in the care plan section) Progress towards PT goals: Progressing toward goals    Frequency    Min 2X/week      PT Plan Current plan remains appropriate    Co-evaluation              AM-PAC PT "6 Clicks" Mobility   Outcome Measure  Help needed turning  from your back to your side while in a flat bed without using bedrails?: A Little Help needed moving from lying on your back to sitting on the side of a flat bed without using bedrails?: A Little Help needed moving to and from a bed to a chair (including a wheelchair)?: A Little Help needed standing up from a chair using your arms (e.g., wheelchair or bedside chair)?: A Lot Help needed to walk in hospital room?: A Lot Help needed climbing 3-5 steps with a railing? : Total 6 Click Score: 14    End of Session Equipment Utilized During Treatment: Gait belt Activity Tolerance: Patient tolerated treatment well Patient left: in chair;with chair alarm set;with call bell/phone within reach;with nursing/sitter in room Nurse Communication: Mobility status PT Visit Diagnosis: Unsteadiness on feet (R26.81);Muscle weakness (generalized) (M62.81);Difficulty in walking, not elsewhere classified (R26.2)     Time: [id number] PT Time Calculation (min) (ACUTE ONLY): 24 min  Charges:  $Therapeutic Exercise: 23-37 mins                     Lieutenant Diego PT, DPT 12:38 PM,07/17/19

## 2019-07-17 NOTE — Consult Note (Signed)
Pharmacy Antibiotic Note  Jason Reed is a 59 y.o. malewith a history of hypertension, type diabetes mellitus, dyslipidemia, seizure disorder and psychosis, who presented to the emergency room, who presented to the emergency room with acute onset of generalized weakness associated with E coli bacteremia.  Pharmacy has been consulted for cefazolin dosing.  Plan:  Day 6 IV antibiotics. Continue  cefazolin 2 grams IV every 8 hours  Height: 6' (182.9 cm) Weight: 220 lb (99.8 kg) IBW/kg (Calculated) : 77.6  Temp (24hrs), Avg:98.6 F (37 C), Min:98.5 F (36.9 C), Max:98.6 F (37 C)  Recent Labs  Lab 07/12/19 1502 07/12/19 1502 07/12/19 1818 07/13/19 0550 07/14/19 0548 07/15/19 0727 07/16/19 0641 07/17/19 0633  WBC 13.5*   < >  --  11.4* 9.2 5.8 4.6 4.3  CREATININE 1.92*   < >  --  1.77* 1.83* 1.59* 1.44* 1.32*  LATICACIDVEN 2.3*  --  1.6  --   --   --   --   --    < > = values in this interval not displayed.    Estimated Creatinine Clearance: 72.8 mL/min (A) (by C-G formula based on SCr of 1.32 mg/dL (H)).    Antimicrobials this admission: ceftriaxone 3/11 >> 3/14 cefazolin 3/14 >>   Microbiology results: 3/10 BCx: 3/4 bottles E coli 3/10 UCx: contaminated  3/10 SARS CoV-2: negative   Thank you for allowing pharmacy to be a part of this patient's care.  Pernell Dupre, PharmD, BCPS Clinical Pharmacist 07/17/2019 10:51 AM

## 2019-07-17 NOTE — TOC Transition Note (Signed)
Transition of Care [facility]) - CM/SW Discharge Note   Patient Details  Name: Jason Reed MRN: [id number] Date of Birth: 29-Sep-1960  Transition of Care [facility]) CM/SW Contact:  Shelbie Hutching, RN Phone Number: 07/17/2019, 3:04 PM   Clinical Narrative:    Patient is medically cleared for discharge to [facility] today.  Patient will be going to room 601.  RNCM will arrange EMS transport after bedside RN calls report to [phone].  [name] from the group home is aware of discharge to [facility].    Final next level of care: Skilled Nursing Facility Barriers to Discharge: Barriers Resolved   Patient Goals and CMS Choice   CMS Medicare.gov Compare Post Acute Care list provided to:: Patient Represenative (must comment) Choice offered to / list presented to : [name])  Discharge Placement              Patient chooses bed at: [facility] [place] Patient to be transferred to facility by: [facility] Name of family member notified: [name] Patient and family notified of of transfer: 07/17/19  Discharge Plan and Services   Discharge Planning Services: CM Consult                                 Social Determinants of Health (SDOH) Interventions     Readmission Risk Interventions No flowsheet data found.

## 2019-07-17 NOTE — Progress Notes (Signed)
Urology Inpatient Progress Note  Subjective: Jason Reed is a 59 y.o. male with solitary left kidney and chronic left hydroureteronephrosis admitted on 07/12/2019 with urosepsis, slightly increased left hydronephrosis, and elevated creatinine.  Foley catheter placed. On broad-spectrum antibiotics as below for management of ampicillin resistant E. coli bacteremia and multiple species on urine culture.  Follow-up renal ultrasound over the weekend with persistent moderate left hydronephrosis, consistent with chronic finding.  Creatinine continues to downtrend, today 1.32.  Per chart review, baseline appears to be between 1.2 and 1.3.  WBC count down today, 4.3.  He is afebrile, VSS.  No acute concerns today.  He denies pain.  Foley catheter in place draining yellow urine.  Anti-infectives: Anti-infectives (From admission, onward)   Start     Dose/Rate Route Frequency Ordered Stop   07/16/19 1000  ceFAZolin (ANCEF) IVPB 2g/100 mL premix     2 g 200 mL/hr over 30 Minutes Intravenous Every 8 hours 07/16/19 0803     07/13/19 0100  cefTRIAXone (ROCEPHIN) 2 g in sodium chloride 0.9 % 100 mL IVPB  Status:  Discontinued     2 g 200 mL/hr over 30 Minutes Intravenous Every 24 hours 07/12/19 2216 07/16/19 0803   07/12/19 1615  ceFEPIme (MAXIPIME) 2 g in sodium chloride 0.9 % 100 mL IVPB     2 g 200 mL/hr over 30 Minutes Intravenous  Once 07/12/19 1612 07/12/19 1824   07/12/19 1615  metroNIDAZOLE (FLAGYL) IVPB 500 mg     500 mg 100 mL/hr over 60 Minutes Intravenous  Once 07/12/19 1612 07/12/19 1942   07/12/19 1615  vancomycin (VANCOCIN) IVPB 1000 mg/200 mL premix     1,000 mg 200 mL/hr over 60 Minutes Intravenous  Once 07/12/19 1612 07/12/19 1941      Current Facility-Administered Medications  Medication Dose Route Frequency Provider Last Rate Last Admin  . 0.9 %  sodium chloride infusion   Intravenous PRN Elodia Florence., MD 10 mL/hr at 07/17/19 0603 250 mL at 07/17/19 0603  .  acetaminophen (TYLENOL) tablet 1,000 mg  1,000 mg Oral Q6H PRN Sharion Settler, NP   1,000 mg at 07/14/19 0908   Or  . acetaminophen (TYLENOL) suppository 650 mg  650 mg Rectal Q6H PRN Sharion Settler, NP      . ARIPiprazole (ABILIFY) tablet 15 mg  15 mg Oral Daily Elodia Florence., MD   15 mg at 07/17/19 [phone]  . aspirin EC tablet 81 mg  81 mg Oral Daily Mansy, Jan A, MD   81 mg at 07/17/19 0917  . ceFAZolin (ANCEF) IVPB 2g/100 mL premix  2 g Intravenous Q8H Dallie Piles, RPH 200 mL/hr at 07/17/19 0604 2 g at 07/17/19 0604  . Chlorhexidine Gluconate Cloth 2 % PADS 6 each  6 each Topical Daily Elodia Florence., MD   6 each at 07/17/19 [phone]  . clonazePAM (KLONOPIN) tablet 1 mg  1 mg Oral QHS Mansy, Jan A, MD   1 mg at 07/16/19 2218  . enoxaparin (LOVENOX) injection 40 mg  40 mg Subcutaneous Q24H Mansy, Jan A, MD   40 mg at 07/16/19 2213  . gabapentin (NEURONTIN) tablet 300 mg  300 mg Oral TID Mansy, Jan A, MD   300 mg at 07/17/19 0918  . insulin aspart (novoLOG) injection 0-9 Units  0-9 Units Subcutaneous TID PC & HS Mansy, Arvella Merles, MD   1 Units at 07/17/19 0919  . lactated ringers infusion   Intravenous Continuous Sharion Settler,  NP 125 mL/hr at 07/17/19 0602 New Bag at 07/17/19 0602  . loratadine (CLARITIN) tablet 10 mg  10 mg Oral Daily Mansy, Jan A, MD   10 mg at 07/17/19 0917  . magnesium hydroxide (MILK OF MAGNESIA) suspension 30 mL  30 mL Oral Daily PRN Mansy, Jan A, MD      . mirtazapine (REMERON) tablet 15 mg  15 mg Oral QHS Mansy, Jan A, MD   15 mg at 07/16/19 2218  . ondansetron (ZOFRAN) tablet 4 mg  4 mg Oral Q6H PRN Mansy, Jan A, MD       Or  . ondansetron [facility]) injection 4 mg  4 mg Intravenous Q6H PRN Mansy, Jan A, MD      . phenytoin (DILANTIN) ER capsule 100 mg  100 mg Oral q AM Shanlever, Pierce Crane, RPH   100 mg at 07/17/19 0604   And  . phenytoin (DILANTIN) ER capsule 300 mg  300 mg Oral QPM Lu Duffel, RPH   300 mg at 07/16/19 1710  . polyethylene  glycol (MIRALAX / GLYCOLAX) packet 17 g  17 g Oral BID Elodia Florence., MD   17 g at 07/16/19 1032  . psyllium (HYDROCIL/METAMUCIL) packet 1 packet  1 packet Oral Daily Mansy, Arvella Merles, MD   1 packet at 07/16/19 1033  . QUEtiapine (SEROQUEL) tablet 800 mg  800 mg Oral QHS Elodia Florence., MD   800 mg at 07/16/19 2221  . senna-docusate (Senokot-S) tablet 2 tablet  2 tablet Oral QHS Elodia Florence., MD   2 tablet at 07/15/19 2203  . simvastatin (ZOCOR) tablet 20 mg  20 mg Oral QHS Mansy, Jan A, MD   20 mg at 07/16/19 2218  . tamsulosin (FLOMAX) capsule 0.4 mg  0.4 mg Oral Daily Mansy, Jan A, MD   0.4 mg at 07/17/19 0917  . traZODone (DESYREL) tablet 25 mg  25 mg Oral QHS PRN Mansy, Jan A, MD   25 mg at 07/16/19 2218     Objective: Vital signs in last 24 hours: Temp:  [98.5 F (36.9 C)-98.6 F (37 C)] 98.5 F (36.9 C) (03/15 0050) Pulse Rate:  [83-90] 90 (03/15 0050) Resp:  [16-18] 16 (03/15 0050) BP: (130-156)/(73-76) 130/76 (03/15 0050) SpO2:  [97 %-100 %] 100 % (03/15 0050)  Intake/Output from previous day: 03/14 0701 - 03/15 0700 In: 5436.6 [P.O.:360; I.V.:4943.2; IV Piggyback:133.4] Out: 3500 [Urine:3500] Intake/Output this shift: No intake/output data recorded.  Physical Exam Vitals and nursing note reviewed.  Constitutional:      General: He is not in acute distress.    Appearance: He is not ill-appearing, toxic-appearing or diaphoretic.  HENT:     Head: Normocephalic and atraumatic.  Pulmonary:     Effort: Pulmonary effort is normal. No respiratory distress.  Skin:    General: Skin is warm and dry.  Neurological:     Mental Status: He is alert. Mental status is at baseline.  Psychiatric:        Mood and Affect: Mood normal.        Behavior: Behavior normal.     Lab Results:  Recent Labs    07/16/19 0641 07/17/19 0633  WBC 4.6 4.3  HGB 9.3* 9.3*  HCT 29.5* 29.0*  PLT 169 177   BMET Recent Labs    07/16/19 0641 07/17/19 0633  NA 138  137  K 3.5 4.0  CL 105 104  CO2 23 27  GLUCOSE 128* 158*  BUN 21* 18  CREATININE 1.44* 1.32*  CALCIUM 8.1* 8.3*   Assessment & Plan: 59 year old male with a solitary left kidney, chronic left hydroureteronephrosis, and chronic constipation/fecal impaction admitted with urosepsis with ampicillin resistant E. coli bacteremia.  WBC count and creatinine downtrending on broad-spectrum antibiotics and following Foley catheter placement.  Recommend narrowing antibiotics per culture sensitivities.  We will continue to defer intervention for chronic left hydronephrosis.  As creatinine approaches baseline, may consider inpatient voiding trial tomorrow with repeat creatinine to ensure stability of renal function in the absence of bladder decompression.  Debroah Loop, PA-C 07/17/2019

## 2019-07-17 NOTE — Progress Notes (Signed)
Report called to [email or web address].

## 2019-08-17 ENCOUNTER — Encounter: Payer: Self-pay | Admitting: Physician Assistant

## 2019-08-17 ENCOUNTER — Other Ambulatory Visit: Payer: Self-pay

## 2019-08-17 ENCOUNTER — Ambulatory Visit (INDEPENDENT_AMBULATORY_CARE_PROVIDER_SITE_OTHER): Payer: Medicare Other | Admitting: Physician Assistant

## 2019-08-17 VITALS — BP 133/77 | HR 108 | Ht 72.0 in | Wt 220.0 lb

## 2019-08-17 DIAGNOSIS — N1330 Unspecified hydronephrosis: Secondary | ICD-10-CM | POA: Diagnosis not present

## 2019-08-17 DIAGNOSIS — R339 Retention of urine, unspecified: Secondary | ICD-10-CM | POA: Diagnosis not present

## 2019-08-17 LAB — MICROSCOPIC EXAMINATION: RBC, Urine: NONE SEEN /hpf (ref 0–2)

## 2019-08-17 LAB — URINALYSIS, COMPLETE
Bilirubin, UA: NEGATIVE
Ketones, UA: NEGATIVE
Leukocytes,UA: NEGATIVE
Nitrite, UA: NEGATIVE
RBC, UA: NEGATIVE
Specific Gravity, UA: 1.02 (ref 1.005–1.030)
Urobilinogen, Ur: 0.2 mg/dL (ref 0.2–1.0)
pH, UA: 5 (ref 5.0–7.5)

## 2019-08-17 LAB — BLADDER SCAN AMB NON-IMAGING: Scan Result: 285

## 2019-08-17 NOTE — Progress Notes (Signed)
08/17/2019 10:30 AM   Jason Reed July 04, 1960 [id number]  CC: Hospital follow-up  HPI: Jason Reed is a 59 y.o. male with solitary left kidney and chronic left hydroureteronephrosis who presents today for hospital follow-up.  He was admitted from 07/12/2019 to 07/17/2019 with sepsis due to E. coli bacteremia in the setting of UTI.  He was discharged on 2 weeks of Bactrim DS twice daily x10 days with recommendations to undergo outpatient cystoscopy with Dr. Erlene Quan.  Today, he reports feeling well.  He denies dysuria, urgency, and frequency.  He is accompanied today by his caregiver.  He was prompted to void and was unable to do so, PVR 237mL.  PMH: Past Medical History:  Diagnosis Date  . Diabetes ([place])   . HTN (hypertension)   . Hyperlipidemia   . Psychosis ([place])   . Seizure [facility])     Surgical History: Past Surgical History:  Procedure Laterality Date  . COLONOSCOPY WITH PROPOFOL N/A 12/22/2018   Procedure: COLONOSCOPY WITH PROPOFOL;  Surgeon: Virgel Manifold, MD;  Location: [facility] ENDOSCOPY;  Service: Endoscopy;  Laterality: N/A;  . COLONOSCOPY WITH PROPOFOL N/A 12/23/2018   Procedure: COLONOSCOPY WITH PROPOFOL;  Surgeon: Virgel Manifold, MD;  Location: [facility] ENDOSCOPY;  Service: Endoscopy;  Laterality: N/A;  . ESOPHAGOGASTRODUODENOSCOPY (EGD) WITH PROPOFOL N/A 12/22/2018   Procedure: ESOPHAGOGASTRODUODENOSCOPY (EGD) WITH PROPOFOL;  Surgeon: Virgel Manifold, MD;  Location: [facility] ENDOSCOPY;  Service: Endoscopy;  Laterality: N/A;    Home Medications:  Allergies as of 08/17/2019      Reactions   Lisinopril Other (See Comments)   Unknown      Medication List       Accurate as of August 17, 2019 10:30 AM. If you have any questions, ask your nurse or doctor.        acetaminophen 325 MG tablet Commonly known as: TYLENOL Take 650 mg by mouth every 6 (six) hours as needed.   ARIPiprazole 15 MG tablet Commonly known as: ABILIFY Take 15 mg by mouth daily.   aspirin EC 81 MG tablet Take 81 mg by mouth daily.   clonazePAM 1 MG tablet Commonly known as: KLONOPIN Take 1 mg by mouth at bedtime.   Dilantin 100 MG ER capsule Generic drug: phenytoin Take 100-300 mg by mouth 2 (two) times daily. Take 100 mg by mouth in the morning and 300 mg by mouth at bedtime.   gabapentin 300 MG capsule Commonly known as: NEURONTIN Take 300 mg by mouth 3 (three) times daily.   gabapentin 600 MG tablet Commonly known as: NEURONTIN Take 600 mg by mouth 3 (three) times daily.   guaiFENesin-dextromethorphan 100-10 MG/5ML syrup Commonly known as: ROBITUSSIN DM Take 10 mLs by mouth every 6 (six) hours as needed for cough.   hydrocortisone cream 1 % Apply 1 application topically 2 (two) times daily as needed.   loratadine 10 MG tablet Commonly known as: CLARITIN Take 10 mg by mouth daily.   metFORMIN 500 MG tablet Commonly known as: GLUCOPHAGE Take 500 mg by mouth 2 (two) times daily with a meal.   mirtazapine 15 MG tablet Commonly known as: REMERON Take 15 mg by mouth at bedtime.   polyethylene glycol 17 g packet Commonly known as: MIRALAX / GLYCOLAX Take 17 g by mouth 2 (two) times daily.   potassium chloride SA 20 MEQ tablet Commonly known as: KLOR-CON Take 1 tablet (20 mEq total) by mouth daily.   psyllium 58.6 % powder Commonly known as: METAMUCIL Take 1 packet by  mouth 3 (three) times daily as needed.   QUEtiapine 400 MG tablet Commonly known as: SEROQUEL Take 800 mg by mouth at bedtime.   simvastatin 20 MG tablet Commonly known as: ZOCOR Take 20 mg by mouth at bedtime.   tamsulosin 0.4 MG Caps capsule Commonly known as: FLOMAX Take 1 capsule (0.4 mg total) by mouth daily.   traZODone 100 MG tablet Commonly known as: DESYREL Take 100 mg by mouth at bedtime.       Allergies:  Allergies  Allergen Reactions  . Lisinopril Other (See Comments)    Unknown    Family History: Family History  Family history unknown: Yes     Social History:   reports that he has never smoked. He has never used smokeless tobacco. He reports previous drug use. He reports that he does not drink alcohol.  Physical Exam: BP 133/77   Pulse (!) 108   Ht 6' (1.829 m)   Wt 220 lb (99.8 kg)   BMI 29.84 kg/m   Constitutional:  Alert and oriented, no acute distress, nontoxic appearing HEENT: [place], AT Cardiovascular: No clubbing, cyanosis, or edema Respiratory: Normal respiratory effort, no increased work of breathing Skin: No rashes, bruises or suspicious lesions Neurologic: Grossly intact, no focal deficits, moving all 4 extremities Psychiatric: Normal mood and affect  Laboratory Data: Results for orders placed or performed in visit on 08/17/19  Microscopic Examination   URINE  Result Value Ref Range   WBC, UA 0-5 0 - 5 /hpf   RBC None seen 0 - 2 /hpf   Epithelial Cells (non renal) 0-10 0 - 10 /hpf   Casts Present (A) None seen /lpf   Cast Type Hyaline casts N/A   Crystals Present (A) N/A   Crystal Type Amorphous Sediment N/A   Bacteria, UA Few None seen/Few  Urinalysis, Complete  Result Value Ref Range   Specific Gravity, UA 1.020 1.005 - 1.030   pH, UA 5.0 5.0 - 7.5   Color, UA Yellow Yellow   Appearance Ur Clear Clear   Leukocytes,UA Negative Negative   Protein,UA Trace (A) Negative/Trace   Glucose, UA 3+ (A) Negative   Ketones, UA Negative Negative   RBC, UA Negative Negative   Bilirubin, UA Negative Negative   Urobilinogen, Ur 0.2 0.2 - 1.0 mg/dL   Nitrite, UA Negative Negative   Microscopic Examination See below:   BLADDER SCAN AMB NON-IMAGING  Result Value Ref Range   Scan Result 285    Assessment & Plan:   1. Hydronephrosis of left kidney Will get creatinine today to evaluate renal function following completion of antibiotics.  Given plans for follow-up cystoscopy, obtained a catheterized urine sample today for culture so that we can pretreat if indicated. - Creatinine, serum - CULTURE, URINE  COMPREHENSIVE - Urinalysis, Complete  2. Incomplete bladder emptying PVR elevated today, though not in overt retention.  I suspect this may be contributory to his recurrent urinary tract infections and/or hydronephrosis.  Cystoscopy needed for further evaluation.  We will schedule today.  Will defer Foley catheter versus CIC teaching pending cystoscopy results. - BLADDER SCAN AMB NON-IMAGING   Return in about 2 weeks (around 08/31/2019) for Cystoscopy with Dr. Erlene Quan.  Debroah Loop, PA-C  [facility] [address] [place], [place] [address] [phone]

## 2019-08-18 LAB — CREATININE, SERUM
Creatinine, Ser: 1.6 mg/dL — ABNORMAL HIGH (ref 0.76–1.27)
GFR calc Af Amer: 53 mL/min/{1.73_m2} — ABNORMAL LOW (ref >59)
GFR calc non Af Amer: 46 mL/min/{1.73_m2} — ABNORMAL LOW (ref >59)

## 2019-08-21 LAB — CULTURE, URINE COMPREHENSIVE

## 2019-08-29 ENCOUNTER — Other Ambulatory Visit: Payer: Self-pay

## 2019-08-29 DIAGNOSIS — N390 Urinary tract infection, site not specified: Secondary | ICD-10-CM

## 2019-08-31 NOTE — Progress Notes (Signed)
   09/01/19  CC:  Chief Complaint  Patient presents with  . Cysto    HPI: Jason Reed is a 59 y.o. M with solitary left kidney, chronic left hydroureteronephrosis, and rUTI who returns today for cystoscopy. Accompanied by health care proxy.   He was admitted from 07/12/2019 to 07/17/2019 with sepsis due to E. coli bacteremia in the setting of UTI.  He was discharged on 2 weeks of Bactrim DS twice daily x10 days with recommendations to undergo outpatient cystoscopy.  On 08/17/19 office visit, he was prompted to void and was unable to do so, PVR 263mL.  No complaints today. PVR 121 mL.   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    NED. A&Ox3.   No respiratory distress   Abd soft, NT, ND Normal phallus with bilateral descended testicles  Cystoscopy Procedure Note  Patient identification was confirmed, informed consent was obtained, and patient was prepped using Betadine solution.  Lidocaine jelly was administered per urethral meatus.     Pre-Procedure: - Inspection reveals a normal caliber ureteral meatus.  Procedure: The flexible cystoscope was introduced without difficulty - No urethral strictures/lesions are present. - Enlarged prostate w/ prostatic fossa measuring 4 cm, no coaptation, fairly wide open funnel shaped. Suggestive of previous prostate procedure.  - Normal bladder neck - Bilateral ureteral orifices identified - Bladder mucosa  reveals no ulcers, tumors, or lesions - No bladder stones - Mild trabeculation  Retroflexion slightly open w/o any intravesical prostate protrusion.    Post-Procedure: - Patient tolerated the procedure well  Pertinent Imagings:  CLINICAL DATA:  Hydronephrosis  EXAM: RENAL / URINARY TRACT ULTRASOUND COMPLETE  COMPARISON:  CT 07/12/2019  FINDINGS: Right Kidney:  Absent  Left Kidney:  Renal measurements: 13.2 x 7.1 x 6 cm = volume: 294.6 mL. Cortical echogenicity normal. Moderate left hydronephrosis. No  mass.  Bladder:  Foley catheter within the empty urinary bladder  Other:  None.  IMPRESSION: 1. Absent right kidney 2. Moderate left hydronephrosis   Electronically Signed   By: Donavan Foil M.D.   On: 07/14/2019 16:57  I have personally reviewed the images and agree with radiologist interpretation.  Also noted  funnel shapped prostatic fossa w/ TURP defect.  Assessment/ Plan:  1. rUTI UA today clear  No underlying bladder pathology appreciated on cysto  Suspect incomplete bladder emptying Will be placed on suppression if rUTIs persist  Rx of Cipro 500 mg sent to pharmacy   2. Incomplete bladder emptying PVR 121 mL, improved today  from previous visit  Bladder neck and prostatic fossa don't seem to be contributing factors based on cysto today which is not definitive  Continue Flomax and conservative management  Will consider Urodynamics in feature however questionable ability to tolerate due to comorrbidities  3. Hydronephrosis of left kidney  Presumably related to reflux  Creatinine stable and parenchyma reserved Return for BMP and PVR in 3 months w/ Sam PA-C  Return in about 3 months (around 12/01/2019) for PVR/ BMP with PA.  I, Lucas Mallow, am acting as a scribe for Dr. Hollice Espy,  I have reviewed the above documentation for accuracy and completeness, and I agree with the above.   Hollice Espy, MD

## 2019-09-01 ENCOUNTER — Other Ambulatory Visit: Payer: Self-pay

## 2019-09-01 ENCOUNTER — Other Ambulatory Visit
Admission: RE | Admit: 2019-09-01 | Discharge: 2019-09-01 | Disposition: A | Discharge status: Home / Self Care | Payer: Medicare Other | Attending: Urology | Admitting: Urology

## 2019-09-01 ENCOUNTER — Ambulatory Visit (INDEPENDENT_AMBULATORY_CARE_PROVIDER_SITE_OTHER): Payer: Medicare Other | Admitting: Urology

## 2019-09-01 ENCOUNTER — Encounter: Payer: Self-pay | Admitting: Urology

## 2019-09-01 DIAGNOSIS — N390 Urinary tract infection, site not specified: Secondary | ICD-10-CM

## 2019-09-01 LAB — URINALYSIS, COMPLETE (UACMP) WITH MICROSCOPIC
Bilirubin Urine: NEGATIVE
Glucose, UA: 100 mg/dL — AB
Hgb urine dipstick: NEGATIVE
Ketones, ur: NEGATIVE mg/dL
Leukocytes,Ua: NEGATIVE
Nitrite: NEGATIVE
Protein, ur: 30 mg/dL — AB
Specific Gravity, Urine: 1.025 (ref 1.005–1.030)
pH: 6 (ref 5.0–8.0)

## 2019-09-01 LAB — BLADDER SCAN AMB NON-IMAGING

## 2019-09-01 MED ORDER — CIPROFLOXACIN HCL 500 MG PO TABS
500.0000 mg | ORAL_TABLET | Freq: Once | ORAL | Status: AC
  Administered 2019-09-01: 500 mg via ORAL

## 2019-11-30 ENCOUNTER — Encounter: Payer: Self-pay | Admitting: Physician Assistant

## 2019-11-30 ENCOUNTER — Ambulatory Visit (INDEPENDENT_AMBULATORY_CARE_PROVIDER_SITE_OTHER): Payer: Medicare Other | Admitting: Physician Assistant

## 2019-11-30 ENCOUNTER — Other Ambulatory Visit: Payer: Self-pay

## 2019-11-30 ENCOUNTER — Ambulatory Visit: Payer: Medicare Other | Admitting: Urology

## 2019-11-30 VITALS — BP 148/89 | HR 101 | Ht 72.0 in | Wt 234.0 lb

## 2019-11-30 DIAGNOSIS — N401 Enlarged prostate with lower urinary tract symptoms: Secondary | ICD-10-CM | POA: Diagnosis not present

## 2019-11-30 DIAGNOSIS — R300 Dysuria: Secondary | ICD-10-CM | POA: Diagnosis not present

## 2019-11-30 DIAGNOSIS — R3914 Feeling of incomplete bladder emptying: Secondary | ICD-10-CM

## 2019-11-30 DIAGNOSIS — N1330 Unspecified hydronephrosis: Secondary | ICD-10-CM

## 2019-11-30 LAB — BLADDER SCAN AMB NON-IMAGING: Scan Result: 112

## 2019-11-30 NOTE — Progress Notes (Signed)
11/30/2019 5:03 PM   Jason Reed [birth year] [id number]  CC: Chief Complaint  Patient presents with  . Follow-up    42mth follow-up with BMP, PVR    HPI: Jason Reed is a 59 y.o. male with solitary left kidney, recurrent UTI, chronic left hydroureteronephrosis, and incomplete bladder emptying on Flomax who presents today for 60-month follow-up.  He was seen most recently in clinic by Dr. Erlene Reed on 09/01/2019 for cystoscopy.  Cystoscopy notable for prostatic enlargement without prostatic coaptation and a fairly open, funnel-shaped prostatic urethra suggestive of a previous prostate procedure.  He is accompanied today by his caregiver, who contributes to HPI.  Today he reports occasional frequency and urgency.  He says he sometimes has pain with urination, including today, but he cannot Reed when this began.  He does not know if he has had a prior prostate procedure.  Family history unknown.  No PSA available per chart review.  PVR 158mL.  Catheterized UA notable for 3+ glucose and trace protein; urine microscopy with many bacteria.  PMH: Past Medical History:  Diagnosis Date  . Diabetes ([place])   . HTN (hypertension)   . Hyperlipidemia   . Psychosis ([place])   . Seizure [facility])     Surgical History: Past Surgical History:  Procedure Laterality Date  . COLONOSCOPY WITH PROPOFOL N/A 12/22/2018   Procedure: COLONOSCOPY WITH PROPOFOL;  Surgeon: Virgel Manifold, MD;  Location: [facility] ENDOSCOPY;  Service: Endoscopy;  Laterality: N/A;  . COLONOSCOPY WITH PROPOFOL N/A 12/23/2018   Procedure: COLONOSCOPY WITH PROPOFOL;  Surgeon: Virgel Manifold, MD;  Location: [facility] ENDOSCOPY;  Service: Endoscopy;  Laterality: N/A;  . ESOPHAGOGASTRODUODENOSCOPY (EGD) WITH PROPOFOL N/A 12/22/2018   Procedure: ESOPHAGOGASTRODUODENOSCOPY (EGD) WITH PROPOFOL;  Surgeon: Virgel Manifold, MD;  Location: [facility] ENDOSCOPY;  Service: Endoscopy;  Laterality: N/A;    Home Medications:  Allergies as of  11/30/2019      Reactions   Lisinopril Other (See Comments), Anaphylaxis   Unknown Unknown      Medication List       Accurate as of November 30, 2019  5:03 PM. If you have any questions, ask your nurse or doctor.        acetaminophen 325 MG tablet Commonly known as: TYLENOL Take 650 mg by mouth every 6 (six) hours as needed.   ARIPiprazole 15 MG tablet Commonly known as: ABILIFY Take 15 mg by mouth daily.   aspirin EC 81 MG tablet Take 81 mg by mouth daily.   clonazePAM 1 MG tablet Commonly known as: KLONOPIN Take 1 mg by mouth at bedtime.   Dilantin 100 MG ER capsule Generic drug: phenytoin Take 100-300 mg by mouth 2 (two) times daily. Take 100 mg by mouth in the morning and 300 mg by mouth at bedtime.   gabapentin 300 MG capsule Commonly known as: NEURONTIN Take 300 mg by mouth 3 (three) times daily.   gabapentin 600 MG tablet Commonly known as: NEURONTIN Take 600 mg by mouth 3 (three) times daily.   guaiFENesin-dextromethorphan 100-10 MG/5ML syrup Commonly known as: ROBITUSSIN DM Take 10 mLs by mouth every 6 (six) hours as needed for cough.   hydrocortisone cream 1 % Apply 1 application topically 2 (two) times daily as needed.   loratadine 10 MG tablet Commonly known as: CLARITIN Take 10 mg by mouth daily.   metFORMIN 500 MG tablet Commonly known as: GLUCOPHAGE Take 500 mg by mouth 2 (two) times daily with a meal.   mirtazapine 15 MG tablet Commonly  known as: REMERON Take 15 mg by mouth at bedtime.   polyethylene glycol 17 g packet Commonly known as: MIRALAX / GLYCOLAX Take 17 g by mouth 2 (two) times daily.   potassium chloride SA 20 MEQ tablet Commonly known as: KLOR-CON Take 1 tablet (20 mEq total) by mouth daily.   psyllium 58.6 % powder Commonly known as: METAMUCIL Take 1 packet by mouth 3 (three) times daily as needed.   QUEtiapine 400 MG tablet Commonly known as: SEROQUEL Take 800 mg by mouth at bedtime.   simvastatin 20 MG  tablet Commonly known as: ZOCOR Take 20 mg by mouth at bedtime.   tamsulosin 0.4 MG Caps capsule Commonly known as: FLOMAX Take 1 capsule (0.4 mg total) by mouth daily.   traZODone 100 MG tablet Commonly known as: DESYREL Take 100 mg by mouth at bedtime.       Allergies:  Allergies  Allergen Reactions  . Lisinopril Other (See Comments) and Anaphylaxis    Unknown Unknown    Family History: Family History  Family history unknown: Yes    Social History:   reports that he has never smoked. He has never used smokeless tobacco. He reports previous drug use. He reports that he does not drink alcohol.  Physical Exam: BP (!) 148/89   Pulse 101   Ht 6' (1.829 m)   Wt (!) 234 lb (106.1 kg)   BMI 31.74 kg/m   Constitutional:  Alert and oriented, no acute distress, nontoxic appearing HEENT: [place], AT Cardiovascular: No clubbing, cyanosis, or edema Respiratory: Normal respiratory effort, no increased work of breathing Skin: No rashes, bruises or suspicious lesions Neurologic: Grossly intact, no focal deficits, moving all 4 extremities Psychiatric: Normal mood and affect  Laboratory Data: Results for orders placed or performed in visit on 11/30/19  CULTURE, URINE COMPREHENSIVE   Specimen: Urine   UR  Result Value Ref Range   Urine Culture, Comprehensive Final report (A)    Organism ID, Bacteria Comment (A)    ANTIMICROBIAL SUSCEPTIBILITY Comment   Microscopic Examination   Urine  Result Value Ref Range   WBC, UA 0-5 0 - 5 /hpf   RBC 0-2 0 - 2 /hpf   Epithelial Cells (non renal) 0-10 0 - 10 /hpf   Renal Epithel, UA 0-10 (A) None seen /hpf   Bacteria, UA Many (A) None seen/Few  Basic metabolic panel  Result Value Ref Range   Glucose 95 65 - 99 mg/dL   BUN 17 8 - 27 mg/dL   Creatinine, Ser 1.53 (H) 0.76 - 1.27 mg/dL   GFR calc non Af Amer 48 (L) >59 mL/min/1.73   GFR calc Af Amer 56 (L) >59 mL/min/1.73   BUN/Creatinine Ratio 11 10 - 24   Sodium 138 134 - 144 mmol/L    Potassium 4.8 3.5 - 5.2 mmol/L   Chloride 99 96 - 106 mmol/L   CO2 25 20 - 29 mmol/L   Calcium 9.4 8.6 - 10.2 mg/dL  Urinalysis, Complete  Result Value Ref Range   Specific Gravity, UA 1.025 1.005 - 1.030   pH, UA 5.5 5.0 - 7.5   Color, UA Yellow Yellow   Appearance Ur Clear Clear   Leukocytes,UA Negative Negative   Protein,UA Trace (A) Negative/Trace   Glucose, UA 3+ (A) Negative   Ketones, UA Negative Negative   RBC, UA Negative Negative   Bilirubin, UA Negative Negative   Urobilinogen, Ur 0.2 0.2 - 1.0 mg/dL   Nitrite, UA Negative Negative  Microscopic Examination See below:   PSA  Result Value Ref Range   Prostate Specific Ag, Serum 5.0 (H) 0.0 - 4.0 ng/mL  BLADDER SCAN AMB NON-IMAGING  Result Value Ref Range   Scan Result 112 ml    Assessment & Plan:   1. Hydronephrosis of left kidney We will obtain BMP today to evaluate renal function.  Stable PVR reassuring for worsened urinary reflux. - Basic metabolic panel  2. Benign prostatic hyperplasia with incomplete bladder emptying PVR stable today.  No known prior PSAs or family history.  Will obtain PSA today given reassuring PVR and UA. - BLADDER SCAN AMB NON-IMAGING - PSA  3. Dysuria Catheterized UA notable for bacteriuria only, will send for culture for further evaluation and defer antibiotics at this time.  Patient is an unreliable historian, unclear if he is experiencing true dysuria.  Recommend treating positive urine culture given his history of urosepsis. - Urinalysis, Complete - CULTURE, URINE COMPREHENSIVE   Return for will call with results.  Debroah Loop, PA-C  [facility] [address] [place], [place] [address] [phone]

## 2019-11-30 NOTE — Progress Notes (Signed)
Patient ID: Jason Reed, male   DOB: [birth year], 59 y.o.   MRN: [id number] In and Out Catheterization  Patient is present today for a I & O catheterization due to inability to urinate. Patient was cleaned and prepped in a sterile fashion with betadine . A 14 coudeFR cath was inserted no complications were noted , 168ml of urine return was noted, urine was yellow in color. A clean urine sample was collected for UA. Bladder was drained  And catheter was removed with out difficulty.    Preformed by: Elinora Weigand/ Fonnie Jarvis

## 2019-12-01 LAB — BASIC METABOLIC PANEL
BUN/Creatinine Ratio: 11 (ref 10–24)
BUN: 17 mg/dL (ref 8–27)
CO2: 25 mmol/L (ref 20–29)
Calcium: 9.4 mg/dL (ref 8.6–10.2)
Chloride: 99 mmol/L (ref 96–106)
Creatinine, Ser: 1.53 mg/dL — ABNORMAL HIGH (ref 0.76–1.27)
GFR calc Af Amer: 56 mL/min/{1.73_m2} — ABNORMAL LOW (ref >59)
GFR calc non Af Amer: 48 mL/min/{1.73_m2} — ABNORMAL LOW (ref >59)
Glucose: 95 mg/dL (ref 65–99)
Potassium: 4.8 mmol/L (ref 3.5–5.2)
Sodium: 138 mmol/L (ref 134–144)

## 2019-12-01 LAB — URINALYSIS, COMPLETE
Bilirubin, UA: NEGATIVE
Ketones, UA: NEGATIVE
Leukocytes,UA: NEGATIVE
Nitrite, UA: NEGATIVE
RBC, UA: NEGATIVE
Specific Gravity, UA: 1.025 (ref 1.005–1.030)
Urobilinogen, Ur: 0.2 mg/dL (ref 0.2–1.0)
pH, UA: 5.5 (ref 5.0–7.5)

## 2019-12-01 LAB — MICROSCOPIC EXAMINATION

## 2019-12-01 LAB — PSA: Prostate Specific Ag, Serum: 5 ng/mL — ABNORMAL HIGH (ref 0.0–4.0)

## 2019-12-04 LAB — CULTURE, URINE COMPREHENSIVE

## 2019-12-05 ENCOUNTER — Telehealth: Payer: Self-pay | Admitting: Physician Assistant

## 2019-12-05 MED ORDER — DOXYCYCLINE HYCLATE 100 MG PO CAPS: 100.0000 mg | ORAL_CAPSULE | Freq: Two times a day (BID) | ORAL | 0 refills | Status: AC

## 2019-12-05 NOTE — Telephone Encounter (Signed)
Notified patients caregiver as advised. Made follow up 6 months appt. Patients caregiver requested that the information I gave to her to be written down for patients file. Printed appt reminder and wrote down requested information for pickup. Confirmed ABX pickup.

## 2019-12-05 NOTE — Telephone Encounter (Signed)
Please contact the patient's caregiver and inform her that Jason Reed's recent tests have come back as below: 1. It does look like he has a urinary tract infection.  I have sent an antibiotic, doxycycline, to [facility] to treat this.  Please start this ASAP. 2. His creatinine, the marker of his kidney function, is stable.  This is very good news.  We will continue to monitor this. 3. His PSA, the marker of his prostate health, is slightly elevated, so we will need to repeat this test over time to see if it is stable or changing.  I do not recommend any further intervention for this at this time.  Please schedule him for routine follow-up in 6 months for repeat PSA, PVR, BMP, and DRE.

## 2020-04-23 ENCOUNTER — Ambulatory Visit: Payer: Medicare Other | Attending: Critical Care Medicine

## 2020-04-23 DIAGNOSIS — Z23 Encounter for immunization: Secondary | ICD-10-CM

## 2020-04-23 NOTE — Progress Notes (Signed)
   Covid-19 Vaccination Clinic  Name:  Jason Reed    MRN: [id number] DOB: [birth year]  04/23/2020  [name] was observed post Covid-19 immunization for 15 min without incident. He was provided with Vaccine Information Sheet and instruction to access the V-Safe system.   [name] was instructed to call 911 with any severe reactions post vaccine: Marland Kitchen Difficulty breathing  . Swelling of face and throat  . A fast heartbeat  . A bad rash all over body  . Dizziness and weakness   Immunizations Administered    Name Date Dose VIS Date Route   Moderna Covid-19 Booster Vaccine 04/23/2020 11:02 AM 0.25 mL 02/21/2020 Intramuscular   Manufacturer: Levan Hurst   Lot: [id number]   [place]: [id number]

## 2020-06-06 ENCOUNTER — Ambulatory Visit: Payer: Self-pay | Admitting: Physician Assistant

## 2020-06-11 ENCOUNTER — Other Ambulatory Visit: Payer: Self-pay

## 2020-06-11 ENCOUNTER — Ambulatory Visit (INDEPENDENT_AMBULATORY_CARE_PROVIDER_SITE_OTHER): Payer: Medicare Other | Admitting: Physician Assistant

## 2020-06-11 ENCOUNTER — Encounter: Payer: Self-pay | Admitting: Physician Assistant

## 2020-06-11 VITALS — BP 131/79 | HR 98 | Ht 73.0 in | Wt 227.0 lb

## 2020-06-11 DIAGNOSIS — N401 Enlarged prostate with lower urinary tract symptoms: Secondary | ICD-10-CM

## 2020-06-11 DIAGNOSIS — N1330 Unspecified hydronephrosis: Secondary | ICD-10-CM

## 2020-06-11 DIAGNOSIS — N390 Urinary tract infection, site not specified: Secondary | ICD-10-CM

## 2020-06-11 DIAGNOSIS — R3914 Feeling of incomplete bladder emptying: Secondary | ICD-10-CM | POA: Diagnosis not present

## 2020-06-11 LAB — BLADDER SCAN AMB NON-IMAGING

## 2020-06-11 NOTE — Progress Notes (Signed)
06/11/2020 1:55 PM   Jason Reed 1960/09/24 [id number]  CC: Chief Complaint  Patient presents with  . Follow-up   HPI: Jason Reed is a 60 y.o. male with PMH solitary left kidney with chronic hydroureteronephrosis, recurrent UTI, and incomplete bladder emptying on Flomax who presents today for 31-month follow-up and repeat PSA.  History has been very challenging, with indeterminate timing and rationale for prior right nephrectomy and cystoscopy findings consistent with possible prior prostate procedure.  He is accompanied today by nursing staff from his facility.  Today he reports doing well over the past 6 months.  He denies dysuria, gross hematuria, fever, chills, nausea, and vomiting.  He states he has had no urinary tract infections in the past 6 months.  He does report mild urinary frequency and urgency without urge incontinence.  He wishes to defer pharmacotherapy at this time.  Nursing staff was unable to provide his current medication list today to confirm that he remains on Flomax. PVR 65mL.  PMH: Past Medical History:  Diagnosis Date  . Diabetes ([place])   . HTN (hypertension)   . Hyperlipidemia   . Psychosis ([place])   . Seizure [facility])     Surgical History: Past Surgical History:  Procedure Laterality Date  . COLONOSCOPY WITH PROPOFOL N/A 12/22/2018   Procedure: COLONOSCOPY WITH PROPOFOL;  Surgeon: Virgel Manifold, MD;  Location: [facility] ENDOSCOPY;  Service: Endoscopy;  Laterality: N/A;  . COLONOSCOPY WITH PROPOFOL N/A 12/23/2018   Procedure: COLONOSCOPY WITH PROPOFOL;  Surgeon: Virgel Manifold, MD;  Location: [facility] ENDOSCOPY;  Service: Endoscopy;  Laterality: N/A;  . ESOPHAGOGASTRODUODENOSCOPY (EGD) WITH PROPOFOL N/A 12/22/2018   Procedure: ESOPHAGOGASTRODUODENOSCOPY (EGD) WITH PROPOFOL;  Surgeon: Virgel Manifold, MD;  Location: [facility] ENDOSCOPY;  Service: Endoscopy;  Laterality: N/A;    Home Medications:  Allergies as of 06/11/2020      Reactions    Lisinopril Other (See Comments), Anaphylaxis   Unknown Unknown      Medication List       Accurate as of June 11, 2020  1:55 PM. If you have any questions, ask your nurse or doctor.        acetaminophen 325 MG tablet Commonly known as: TYLENOL Take 650 mg by mouth every 6 (six) hours as needed.   ARIPiprazole 15 MG tablet Commonly known as: ABILIFY Take 15 mg by mouth daily.   aspirin EC 81 MG tablet Take 81 mg by mouth daily.   clonazePAM 1 MG tablet Commonly known as: KLONOPIN Take 1 mg by mouth at bedtime.   Dilantin 100 MG ER capsule Generic drug: phenytoin Take 100-300 mg by mouth 2 (two) times daily. Take 100 mg by mouth in the morning and 300 mg by mouth at bedtime.   gabapentin 300 MG capsule Commonly known as: NEURONTIN Take 300 mg by mouth 3 (three) times daily.   gabapentin 600 MG tablet Commonly known as: NEURONTIN Take 600 mg by mouth 3 (three) times daily.   guaiFENesin-dextromethorphan 100-10 MG/5ML syrup Commonly known as: ROBITUSSIN DM Take 10 mLs by mouth every 6 (six) hours as needed for cough.   hydrocortisone cream 1 % Apply 1 application topically 2 (two) times daily as needed.   loratadine 10 MG tablet Commonly known as: CLARITIN Take 10 mg by mouth daily.   metFORMIN 500 MG tablet Commonly known as: GLUCOPHAGE Take 500 mg by mouth 2 (two) times daily with a meal.   mirtazapine 15 MG tablet Commonly known as: REMERON Take 15 mg by mouth  at bedtime.   polyethylene glycol 17 g packet Commonly known as: MIRALAX / GLYCOLAX Take 17 g by mouth 2 (two) times daily.   potassium chloride SA 20 MEQ tablet Commonly known as: KLOR-CON Take 1 tablet (20 mEq total) by mouth daily.   psyllium 58.6 % powder Commonly known as: METAMUCIL Take 1 packet by mouth 3 (three) times daily as needed.   QUEtiapine 400 MG tablet Commonly known as: SEROQUEL Take 800 mg by mouth at bedtime.   simvastatin 20 MG tablet Commonly known as:  ZOCOR Take 20 mg by mouth at bedtime.   tamsulosin 0.4 MG Caps capsule Commonly known as: FLOMAX Take 1 capsule (0.4 mg total) by mouth daily.   traZODone 100 MG tablet Commonly known as: DESYREL Take 100 mg by mouth at bedtime.       Allergies:  Allergies  Allergen Reactions  . Lisinopril Other (See Comments) and Anaphylaxis    Unknown Unknown    Family History: Family History  Family history unknown: Yes    Social History:   reports that he has never smoked. He has never used smokeless tobacco. He reports previous drug use. He reports that he does not drink alcohol.  Physical Exam: BP 131/79   Pulse 98   Ht 6\' 1"  (1.854 m)   Wt 227 lb (103 kg)   BMI 29.95 kg/m   Constitutional:  Alert, no acute distress, nontoxic appearing HEENT: [place], AT Cardiovascular: No clubbing, cyanosis, or edema Respiratory: Normal respiratory effort, no increased work of breathing GU: Patent urethral meatus, bilateral descended testicles. Normal sphincter tone. Symmetrically enlarged, nontender 50+ cc prostate without nodules or induration. Stool palpable in the rectal vault. Skin: No rashes, bruises or suspicious lesions Neurologic: Grossly intact, no focal deficits, moving all 4 extremities  Laboratory Data: Results for orders placed or performed in visit on 06/11/20  Bladder Scan (Post Void Residual) in office  Result Value Ref Range   Scan Result 5mL    Assessment & Plan:   1. Benign prostatic hyperplasia with incomplete bladder emptying Unclear if he remains on Flomax, though PVR is improved today compared to prior.  Repeat PSA taken today, will contact the patient with results and attempt to confirm Flomax with his facility staff at that time.  Patient's reports of urgency and frequency are relatively mild today and he wishes to defer pharmacotherapy.  I am in agreement with this plan. - Bladder Scan (Post Void Residual) in office - PSA  2. Recurrent UTI Not clinically  infected today, no infections since his last clinic visit 6 months ago.  We will continue to monitor.  3. Hydronephrosis of left kidney BMP obtained today to trend renal function.  Will contact patient with results. - Basic metabolic panel  Return in about 1 year (around 06/11/2021) for Annual DRE/IPSS/PVR with PSA and BMP prior.  Debroah Loop, PA-C  [facility] [address] [place], [place] [address] [phone]

## 2020-06-12 LAB — BASIC METABOLIC PANEL
BUN/Creatinine Ratio: 12 (ref 10–24)
BUN: 22 mg/dL (ref 8–27)
CO2: 25 mmol/L (ref 20–29)
Calcium: 9 mg/dL (ref 8.6–10.2)
Chloride: 100 mmol/L (ref 96–106)
Creatinine, Ser: 1.81 mg/dL — ABNORMAL HIGH (ref 0.76–1.27)
GFR calc Af Amer: 46 mL/min/{1.73_m2} — ABNORMAL LOW (ref >59)
GFR calc non Af Amer: 39 mL/min/{1.73_m2} — ABNORMAL LOW (ref >59)
Glucose: 82 mg/dL (ref 65–99)
Potassium: 4.7 mmol/L (ref 3.5–5.2)
Sodium: 139 mmol/L (ref 134–144)

## 2020-06-12 LAB — PSA: Prostate Specific Ag, Serum: 6.6 ng/mL — ABNORMAL HIGH (ref 0.0–4.0)

## 2020-06-13 ENCOUNTER — Telehealth: Payer: Self-pay | Admitting: Physician Assistant

## 2020-06-13 DIAGNOSIS — R972 Elevated prostate specific antigen [PSA]: Secondary | ICD-10-CM

## 2020-06-13 DIAGNOSIS — N1831 Chronic kidney disease, stage 3a: Secondary | ICD-10-CM

## 2020-06-13 NOTE — Telephone Encounter (Signed)
Notified Ms Jason Reed of Jason Reed order, advised her that Radiology would reach out to her to set up for Mr Jason Reed. Made results appt for 3/22. Ms Jason Reed could not confirm Flomax Rx, states she will call back as soon as she confirms that information.

## 2020-06-13 NOTE — Telephone Encounter (Signed)
I just spoke with the patient's caregiver, Tawana Scale, via telephone to discuss the results of Mr. Schinke labs this week.  I explained that his PSA has risen to 6.6, which represents a rise of 1.6 over the past 6 months.  This is a marked increase in PSA and concerning for possible malignancy in the absence of new irritative voiding symptoms, which he denied in clinic this week.  Notably, patient had a benign DRE consistent with known BPH with no evidence of nodules.  We discussed various treatment options at this point including surveillance with repeat PSA in 3 months, prostate MRI, and prostate biopsy.  Ms. Vicente Males prefers to proceed with prostate MRI and I am in agreement with this plan, especially with concerns regarding his ability to tolerate a more invasive prostate biopsy.  I have placed an order for a prostate MRI today.  Additionally, we discussed that Mr. Lloyd's creatinine, the marker of his renal function, is slightly elevated over prior.  Ms. Vicente Males reports that Mr. Barth struggles with oral hydration.  I suspect his slightly elevated creatinine is prerenal in nature, however given his history of solitary left kidney and chronic hydronephrosis, I would like to obtain a renal ultrasound as well to evaluate his left kidney for any interval changes.  I have also placed an order for renal ultrasound today.  Please schedule the patient for results visit with Dr. Erlene Quan in 4 to 6 weeks.  Additionally, when you call Ms. Vicente Males to schedule this, please attempt to confirm whether or not the patient remains on Flomax as she was away from home at the time of our call today and unable to do so.

## 2020-06-28 ENCOUNTER — Other Ambulatory Visit: Payer: Self-pay | Admitting: Urology

## 2020-07-22 ENCOUNTER — Telehealth: Payer: Self-pay | Admitting: *Deleted

## 2020-07-22 NOTE — Telephone Encounter (Addendum)
Spoke with caregiver regarding follow up appointment for prostate MRI-states no one has contacted to have MRI scheduled. Provided number for scheduling. Will call to schedule. Needs to have complete asap, voiced understanding.

## 2020-07-23 ENCOUNTER — Ambulatory Visit: Payer: Self-pay | Admitting: Urology

## 2020-07-23 NOTE — Telephone Encounter (Signed)
MRI and Renal US scheduled-called patient's caregiver to give information and instructions for Prostate MRI. Caregiver was very adamant that patient is unable to proceed with MRI due to needing an enema for MRI and his mental cognition.

## 2020-07-24 NOTE — Telephone Encounter (Signed)
Based on caregiver's concerns that patient would not tolerate prostate MRI, I recommend repeating PSA (free + total) at 3 months (around 09/08/2020) to assess for any further changes. Based on these results, we may consider starting a medication to shrink the prostate and assessing how the PSA changes in response to this medication. For now, let's just plan for the repeat PSA in May and I'll contact them to arrange any medication changes once I get these results.

## 2020-07-24 NOTE — Telephone Encounter (Signed)
Please disregard my last phone note. In consultation with Dr. Erlene Quan, we recommend proceeding with prostate MRI even if patient cannot tolerate the recommended enema. Please explain to his caregiver that while this may slightly reduce visualization on the scan, the MRI remains worth performing given his rising PSA and concerns for his ability to tolerate more invasive testing.

## 2020-07-25 NOTE — Telephone Encounter (Signed)
Spoke with patients caregiver ab proceeding with the prostate MRI despite patient being unable to do fleets enema. Explained the need for the imaging for the patient, patients caregiver verbalized understanding. Confirmed both appts for prostate MRI and RUS. Ms. Jason Reed confirmed all understandings.

## 2020-08-06 ENCOUNTER — Ambulatory Visit: Payer: Medicare Other

## 2020-08-06 ENCOUNTER — Ambulatory Visit: Admission: RE | Admit: 2020-08-06 | Payer: Medicare Other | Source: Ambulatory Visit

## 2020-10-03 ENCOUNTER — Inpatient Hospital Stay: Payer: Medicare Other

## 2020-10-03 ENCOUNTER — Emergency Department: Payer: Medicare Other

## 2020-10-03 ENCOUNTER — Other Ambulatory Visit: Payer: Self-pay

## 2020-10-03 ENCOUNTER — Inpatient Hospital Stay
Admission: EM | Admit: 2020-10-03 | Discharge: 2020-10-05 | DRG: 871 | Disposition: A | Discharge status: Home Health | Payer: Medicare Other | Source: Ambulatory Visit | Attending: Internal Medicine | Admitting: Internal Medicine

## 2020-10-03 DIAGNOSIS — E1122 Type 2 diabetes mellitus with diabetic chronic kidney disease: Secondary | ICD-10-CM | POA: Diagnosis present

## 2020-10-03 DIAGNOSIS — A419 Sepsis, unspecified organism: Principal | ICD-10-CM | POA: Diagnosis present

## 2020-10-03 DIAGNOSIS — N182 Chronic kidney disease, stage 2 (mild): Secondary | ICD-10-CM | POA: Diagnosis present

## 2020-10-03 DIAGNOSIS — Z888 Allergy status to other drugs, medicaments and biological substances status: Secondary | ICD-10-CM | POA: Diagnosis not present

## 2020-10-03 DIAGNOSIS — Z7982 Long term (current) use of aspirin: Secondary | ICD-10-CM | POA: Diagnosis not present

## 2020-10-03 DIAGNOSIS — I129 Hypertensive chronic kidney disease with stage 1 through stage 4 chronic kidney disease, or unspecified chronic kidney disease: Secondary | ICD-10-CM | POA: Diagnosis present

## 2020-10-03 DIAGNOSIS — Z6831 Body mass index (BMI) 31.0-31.9, adult: Secondary | ICD-10-CM

## 2020-10-03 DIAGNOSIS — R6520 Severe sepsis without septic shock: Secondary | ICD-10-CM | POA: Diagnosis not present

## 2020-10-03 DIAGNOSIS — I10 Essential (primary) hypertension: Secondary | ICD-10-CM | POA: Diagnosis not present

## 2020-10-03 DIAGNOSIS — J690 Pneumonitis due to inhalation of food and vomit: Secondary | ICD-10-CM | POA: Diagnosis present

## 2020-10-03 DIAGNOSIS — Z7984 Long term (current) use of oral hypoglycemic drugs: Secondary | ICD-10-CM

## 2020-10-03 DIAGNOSIS — Z20822 Contact with and (suspected) exposure to covid-19: Secondary | ICD-10-CM | POA: Diagnosis present

## 2020-10-03 DIAGNOSIS — E0822 Diabetes mellitus due to underlying condition with diabetic chronic kidney disease: Secondary | ICD-10-CM

## 2020-10-03 DIAGNOSIS — Z794 Long term (current) use of insulin: Secondary | ICD-10-CM

## 2020-10-03 DIAGNOSIS — R531 Weakness: Secondary | ICD-10-CM

## 2020-10-03 DIAGNOSIS — E669 Obesity, unspecified: Secondary | ICD-10-CM | POA: Diagnosis present

## 2020-10-03 DIAGNOSIS — K5641 Fecal impaction: Secondary | ICD-10-CM | POA: Diagnosis present

## 2020-10-03 DIAGNOSIS — R309 Painful micturition, unspecified: Secondary | ICD-10-CM | POA: Diagnosis present

## 2020-10-03 DIAGNOSIS — R4189 Other symptoms and signs involving cognitive functions and awareness: Secondary | ICD-10-CM | POA: Diagnosis present

## 2020-10-03 DIAGNOSIS — I2510 Atherosclerotic heart disease of native coronary artery without angina pectoris: Secondary | ICD-10-CM | POA: Diagnosis present

## 2020-10-03 DIAGNOSIS — E872 Acidosis: Secondary | ICD-10-CM | POA: Diagnosis present

## 2020-10-03 DIAGNOSIS — G40909 Epilepsy, unspecified, not intractable, without status epilepticus: Secondary | ICD-10-CM | POA: Diagnosis present

## 2020-10-03 DIAGNOSIS — J189 Pneumonia, unspecified organism: Secondary | ICD-10-CM | POA: Diagnosis present

## 2020-10-03 DIAGNOSIS — E785 Hyperlipidemia, unspecified: Secondary | ICD-10-CM | POA: Diagnosis present

## 2020-10-03 DIAGNOSIS — Z79899 Other long term (current) drug therapy: Secondary | ICD-10-CM

## 2020-10-03 DIAGNOSIS — F29 Unspecified psychosis not due to a substance or known physiological condition: Secondary | ICD-10-CM | POA: Diagnosis present

## 2020-10-03 DIAGNOSIS — R0902 Hypoxemia: Secondary | ICD-10-CM | POA: Diagnosis present

## 2020-10-03 DIAGNOSIS — N179 Acute kidney failure, unspecified: Secondary | ICD-10-CM | POA: Diagnosis not present

## 2020-10-03 LAB — URINALYSIS, COMPLETE (UACMP) WITH MICROSCOPIC
Bacteria, UA: NONE SEEN
Bilirubin Urine: NEGATIVE
Glucose, UA: >=500 mg/dL — AB
Ketones, ur: NEGATIVE mg/dL
Leukocytes,Ua: NEGATIVE
Nitrite: NEGATIVE
Protein, ur: 30 mg/dL — AB
Specific Gravity, Urine: 1.013 (ref 1.005–1.030)
pH: 5 (ref 5.0–8.0)

## 2020-10-03 LAB — PROTIME-INR
INR: 1.1 (ref 0.8–1.2)
Prothrombin Time: 14.2 seconds (ref 11.4–15.2)

## 2020-10-03 LAB — RESP PANEL BY RT-PCR (FLU A&B, COVID) ARPGX2
Influenza A by PCR: NEGATIVE
Influenza B by PCR: NEGATIVE
SARS Coronavirus 2 by RT PCR: NEGATIVE

## 2020-10-03 LAB — GLUCOSE, CAPILLARY
Glucose-Capillary: 142 mg/dL — ABNORMAL HIGH (ref 70–99)
Glucose-Capillary: 166 mg/dL — ABNORMAL HIGH (ref 70–99)

## 2020-10-03 LAB — TROPONIN I (HIGH SENSITIVITY)
Troponin I (High Sensitivity): 17 ng/L (ref <18)
Troponin I (High Sensitivity): 18 ng/L — ABNORMAL HIGH (ref <18)

## 2020-10-03 LAB — COMPREHENSIVE METABOLIC PANEL
ALT: 38 U/L (ref 0–44)
AST: 46 U/L — ABNORMAL HIGH (ref 15–41)
Albumin: 4 g/dL (ref 3.5–5.0)
Alkaline Phosphatase: 88 U/L (ref 38–126)
Anion gap: 10 (ref 5–15)
BUN: 28 mg/dL — ABNORMAL HIGH (ref 8–23)
CO2: 25 mmol/L (ref 22–32)
Calcium: 8.7 mg/dL — ABNORMAL LOW (ref 8.9–10.3)
Chloride: 99 mmol/L (ref 98–111)
Creatinine, Ser: 1.72 mg/dL — ABNORMAL HIGH (ref 0.61–1.24)
GFR, Estimated: 44 mL/min — ABNORMAL LOW (ref >60)
Glucose, Bld: 215 mg/dL — ABNORMAL HIGH (ref 70–99)
Potassium: 4.4 mmol/L (ref 3.5–5.1)
Sodium: 134 mmol/L — ABNORMAL LOW (ref 135–145)
Total Bilirubin: 0.7 mg/dL (ref 0.3–1.2)
Total Protein: 7.9 g/dL (ref 6.5–8.1)

## 2020-10-03 LAB — CBC WITH DIFFERENTIAL/PLATELET
Abs Immature Granulocytes: 0.03 10*3/uL (ref 0.00–0.07)
Basophils Absolute: 0 10*3/uL (ref 0.0–0.1)
Basophils Relative: 0 %
Eosinophils Absolute: 0.2 10*3/uL (ref 0.0–0.5)
Eosinophils Relative: 3 %
HCT: 34.8 % — ABNORMAL LOW (ref 39.0–52.0)
Hemoglobin: 11.3 g/dL — ABNORMAL LOW (ref 13.0–17.0)
Immature Granulocytes: 1 %
Lymphocytes Relative: 10 %
Lymphs Abs: 0.6 10*3/uL — ABNORMAL LOW (ref 0.7–4.0)
MCH: 29.6 pg (ref 26.0–34.0)
MCHC: 32.5 g/dL (ref 30.0–36.0)
MCV: 91.1 fL (ref 80.0–100.0)
Monocytes Absolute: 0.6 10*3/uL (ref 0.1–1.0)
Monocytes Relative: 10 %
Neutro Abs: 5 10*3/uL (ref 1.7–7.7)
Neutrophils Relative %: 76 %
Platelets: 117 10*3/uL — ABNORMAL LOW (ref 150–400)
RBC: 3.82 MIL/uL — ABNORMAL LOW (ref 4.22–5.81)
RDW: 12.5 % (ref 11.5–15.5)
WBC: 6.5 10*3/uL (ref 4.0–10.5)
nRBC: 0 % (ref 0.0–0.2)

## 2020-10-03 LAB — LACTIC ACID, PLASMA
Lactic Acid, Venous: 2.8 mmol/L (ref 0.5–1.9)
Lactic Acid, Venous: 1.7 mmol/L (ref 0.5–1.9)

## 2020-10-03 LAB — PROCALCITONIN: Procalcitonin: 1.13 ng/mL

## 2020-10-03 LAB — APTT: aPTT: 25 seconds (ref 24–36)

## 2020-10-03 MED ORDER — LORATADINE 10 MG PO TABS
10.0000 mg | ORAL_TABLET | Freq: Every day | ORAL | Status: DC
  Administered 2020-10-04 – 2020-10-05 (×2): 10 mg via ORAL
  Filled 2020-10-03 (×2): qty 1

## 2020-10-03 MED ORDER — SODIUM CHLORIDE 0.9 % IV SOLN
2.0000 g | Freq: Once | INTRAVENOUS | Status: AC
  Administered 2020-10-03: 2 g via INTRAVENOUS
  Filled 2020-10-03: qty 2

## 2020-10-03 MED ORDER — QUETIAPINE FUMARATE 300 MG PO TABS
800.0000 mg | ORAL_TABLET | Freq: Every day | ORAL | Status: DC
  Administered 2020-10-03 – 2020-10-04 (×2): 800 mg via ORAL
  Filled 2020-10-03 (×3): qty 1

## 2020-10-03 MED ORDER — ONDANSETRON HCL 4 MG/2ML IJ SOLN: 4.0000 mg | Freq: Four times a day (QID) | INTRAMUSCULAR | Status: DC | PRN

## 2020-10-03 MED ORDER — PSYLLIUM 58.6 % PO POWD: 1.0000 | Freq: Every day | ORAL | Status: DC

## 2020-10-03 MED ORDER — LACTATED RINGERS IV SOLN: INTRAVENOUS | Status: DC

## 2020-10-03 MED ORDER — ARIPIPRAZOLE 15 MG PO TABS
15.0000 mg | ORAL_TABLET | Freq: Every day | ORAL | Status: DC
  Administered 2020-10-04 – 2020-10-05 (×2): 15 mg via ORAL
  Filled 2020-10-03 (×2): qty 1

## 2020-10-03 MED ORDER — LACTATED RINGERS IV BOLUS
1000.0000 mL | Freq: Once | INTRAVENOUS | Status: AC
  Administered 2020-10-03: 1000 mL via INTRAVENOUS

## 2020-10-03 MED ORDER — GUAIFENESIN-DM 100-10 MG/5ML PO SYRP: 10.0000 mL | ORAL_SOLUTION | Freq: Four times a day (QID) | ORAL | Status: DC | PRN

## 2020-10-03 MED ORDER — METRONIDAZOLE 500 MG/100ML IV SOLN: 500.0000 mg | Freq: Three times a day (TID) | INTRAVENOUS | Status: DC

## 2020-10-03 MED ORDER — VANCOMYCIN HCL IN DEXTROSE 1-5 GM/200ML-% IV SOLN: 1000.0000 mg | Freq: Once | INTRAVENOUS | Status: DC

## 2020-10-03 MED ORDER — SODIUM CHLORIDE 0.9 % IV SOLN: 2.0000 g | Freq: Once | INTRAVENOUS | Status: DC

## 2020-10-03 MED ORDER — ACETAMINOPHEN 325 MG PO TABS
650.0000 mg | ORAL_TABLET | Freq: Four times a day (QID) | ORAL | Status: DC | PRN
  Administered 2020-10-03: 650 mg via ORAL
  Filled 2020-10-03: qty 2

## 2020-10-03 MED ORDER — POLYETHYLENE GLYCOL 3350 17 G PO PACK
17.0000 g | PACK | Freq: Two times a day (BID) | ORAL | Status: DC
  Administered 2020-10-03 – 2020-10-05 (×4): 17 g via ORAL
  Filled 2020-10-03 (×4): qty 1

## 2020-10-03 MED ORDER — METRONIDAZOLE 500 MG/100ML IV SOLN
500.0000 mg | Freq: Once | INTRAVENOUS | Status: AC
  Administered 2020-10-03: 500 mg via INTRAVENOUS
  Filled 2020-10-03: qty 100

## 2020-10-03 MED ORDER — LACTATED RINGERS IV BOLUS (SEPSIS)
1000.0000 mL | Freq: Once | INTRAVENOUS | Status: AC
  Administered 2020-10-03: 1000 mL via INTRAVENOUS

## 2020-10-03 MED ORDER — SODIUM CHLORIDE 0.9 % IV SOLN
500.0000 mg | INTRAVENOUS | Status: DC
  Administered 2020-10-03: 500 mg via INTRAVENOUS
  Filled 2020-10-03 (×3): qty 500

## 2020-10-03 MED ORDER — GABAPENTIN 300 MG PO CAPS
300.0000 mg | ORAL_CAPSULE | Freq: Three times a day (TID) | ORAL | Status: DC
  Administered 2020-10-03 – 2020-10-05 (×5): 300 mg via ORAL
  Filled 2020-10-03 (×5): qty 1

## 2020-10-03 MED ORDER — MIRTAZAPINE 15 MG PO TABS
15.0000 mg | ORAL_TABLET | Freq: Every day | ORAL | Status: DC
  Administered 2020-10-03 – 2020-10-04 (×2): 15 mg via ORAL
  Filled 2020-10-03 (×2): qty 1

## 2020-10-03 MED ORDER — ENOXAPARIN SODIUM 60 MG/0.6ML IJ SOSY
0.5000 mg/kg | PREFILLED_SYRINGE | INTRAMUSCULAR | Status: DC
  Administered 2020-10-03 – 2020-10-04 (×2): 57.5 mg via SUBCUTANEOUS
  Filled 2020-10-03 (×3): qty 0.57

## 2020-10-03 MED ORDER — SIMVASTATIN 20 MG PO TABS
20.0000 mg | ORAL_TABLET | Freq: Every day | ORAL | Status: DC
  Administered 2020-10-03 – 2020-10-04 (×2): 20 mg via ORAL
  Filled 2020-10-03 (×2): qty 1

## 2020-10-03 MED ORDER — POTASSIUM CHLORIDE CRYS ER 20 MEQ PO TBCR
20.0000 meq | EXTENDED_RELEASE_TABLET | Freq: Every day | ORAL | Status: DC
  Administered 2020-10-04 – 2020-10-05 (×2): 20 meq via ORAL
  Filled 2020-10-03 (×2): qty 1

## 2020-10-03 MED ORDER — INSULIN ASPART 100 UNIT/ML IJ SOLN
0.0000 [IU] | Freq: Three times a day (TID) | INTRAMUSCULAR | Status: DC
  Administered 2020-10-03 – 2020-10-04 (×2): 2 [IU] via SUBCUTANEOUS
  Administered 2020-10-04: 3 [IU] via SUBCUTANEOUS
  Administered 2020-10-05: 5 [IU] via SUBCUTANEOUS
  Administered 2020-10-05: 2 [IU] via SUBCUTANEOUS
  Filled 2020-10-03 (×5): qty 1

## 2020-10-03 MED ORDER — PHENYTOIN SODIUM EXTENDED 100 MG PO CAPS
100.0000 mg | ORAL_CAPSULE | Freq: Every day | ORAL | Status: DC
  Administered 2020-10-04 – 2020-10-05 (×2): 100 mg via ORAL
  Filled 2020-10-03 (×2): qty 1

## 2020-10-03 MED ORDER — TRAZODONE HCL 100 MG PO TABS
100.0000 mg | ORAL_TABLET | Freq: Every day | ORAL | Status: DC
  Administered 2020-10-03 – 2020-10-04 (×2): 100 mg via ORAL
  Filled 2020-10-03 (×2): qty 1

## 2020-10-03 MED ORDER — ASPIRIN EC 81 MG PO TBEC
81.0000 mg | DELAYED_RELEASE_TABLET | Freq: Every day | ORAL | Status: DC
  Administered 2020-10-04 – 2020-10-05 (×2): 81 mg via ORAL
  Filled 2020-10-03 (×2): qty 1

## 2020-10-03 MED ORDER — TAMSULOSIN HCL 0.4 MG PO CAPS
0.4000 mg | ORAL_CAPSULE | Freq: Every day | ORAL | Status: DC
  Administered 2020-10-04 – 2020-10-05 (×2): 0.4 mg via ORAL
  Filled 2020-10-03 (×2): qty 1

## 2020-10-03 MED ORDER — VANCOMYCIN HCL IN DEXTROSE 1-5 GM/200ML-% IV SOLN
1000.0000 mg | Freq: Once | INTRAVENOUS | Status: AC
  Administered 2020-10-03: 1000 mg via INTRAVENOUS
  Filled 2020-10-03: qty 200

## 2020-10-03 MED ORDER — CLONAZEPAM 1 MG PO TABS
1.0000 mg | ORAL_TABLET | Freq: Every day | ORAL | Status: DC
Start: 2020-10-03 — End: 2020-10-05
  Administered 2020-10-03 – 2020-10-04 (×2): 1 mg via ORAL
  Filled 2020-10-03 (×2): qty 1

## 2020-10-03 MED ORDER — SODIUM CHLORIDE 0.9 % IV SOLN
2.0000 g | INTRAVENOUS | Status: DC
  Administered 2020-10-03 – 2020-10-04 (×2): 2 g via INTRAVENOUS
  Filled 2020-10-03 (×3): qty 20

## 2020-10-03 MED ORDER — PHENYTOIN SODIUM EXTENDED 100 MG PO CAPS
300.0000 mg | ORAL_CAPSULE | Freq: Every day | ORAL | Status: DC
  Administered 2020-10-03 – 2020-10-04 (×2): 300 mg via ORAL
  Filled 2020-10-03 (×3): qty 3

## 2020-10-03 MED ORDER — ONDANSETRON HCL 4 MG PO TABS: 4.0000 mg | ORAL_TABLET | Freq: Four times a day (QID) | ORAL | Status: DC | PRN

## 2020-10-03 NOTE — ED Notes (Signed)
CN at bedside attempting blood draw.

## 2020-10-03 NOTE — ED Notes (Signed)
Overdue meds are not yet verified by pharmacy. 

## 2020-10-03 NOTE — ED Triage Notes (Signed)
Pt to ED [facility] Lives at group home, staff brought pt to dr office for weakness and urinary incontinence T 102 oral T, 125 hr, sinus rhythm, 12 lead unremarkable, RR 28 per EMS 975mg  tylenol given 20g L wrist placed by EMS, received 437mL fluid  EDP at bedside  Pt appears to have wet cough. States at times has burning with urination. C/o HA, denies CP

## 2020-10-03 NOTE — ED Notes (Signed)
Attempting to draw repeat trop and lactic. IVs do not draw back.

## 2020-10-03 NOTE — ED Notes (Signed)
Pt back from CT

## 2020-10-03 NOTE — ED Notes (Signed)
Unable to obtain another IV or bloodwork except 1 aerobic  ulture bottle. EDP and CN informed. Will begin to administer IV bolus and abx through EMS IV.

## 2020-10-03 NOTE — H&P (Addendum)
History and Physical    Jason Reed KJZ:[id number] DOB: [birth year] DOA: 10/03/2020  PCP: Marguerita Merles, MD   Patient coming from: Group home  I have personally briefly reviewed patient's old medical records in [place]  Chief Complaint: "I feel bad"  Most of the history was obtained from EMR as well as patient's caregiver Ms Paulene Floor  HPI: Jason Reed is a 60 y.o. male with medical history significant for diabetes mellitus, hypertension, seizure disorder who was sent to the emergency room from his primary care provider's office after he was taken there by his caregiver for evaluation of weakness and urinary incontinence. Patient was noted to be febrile at his doctor's office with a T-max of 102, he was tachycardic with heart rate of 125 and hypoxic with respiratory rate of 28.  EMS administered Tylenol and 433ml of IV fluids. His caregiver states that he has a cough which is dry but she was concerned because he was very weak and sweaty on the day of admission. Patient states that sometimes he has burning with urination but denies having any chest pain, no abdominal pain, no nausea, no vomiting, no palpitations, no blurred vision, no focal deficits. Labs show sodium 134, potassium 4.4, chloride 99, bicarb 25, glucose 215, BUN 28, creatinine 1.72, calcium 8.7, alkaline phosphatase 88, albumin 4.0, AST 46, ALT 38, total protein 7.9, troponin 17, lactic acid 2.8 >> 1.7, procalcitonin 1.13, white count 6.5, hemoglobin 11.3, hematocrit 34.8, MCV 91.1, RDW 12.5, platelet count 117, PT 14.2, INR 1.1 Respiratory viral panel is negative Chest chest x-ray reviewed by me shows bronchitic changes with right basilar atelectasis. CT scan of abdomen and pelvis shows large volume of stool throughout the colon including large rectal stool ball with associated mild rectal wall thickening. Findings are suggestive of constipation and possible fecal impaction. Mild-moderate left hydroureteronephrosis,  similar in appearance to prior study. No renal or ureteral calculus is identified. A component of mechanical obstruction related to rectosigmoid stool burden is a consideration. Coronary artery atherosclerosis.   ED Course: Patient is a 60 year old African-American male who resides in a group home and presents to the ER for evaluation of weakness, fever and nonproductive cough.  He was tachycardic and tachypneic and had elevated lactic acid level as well as procalcitonin level. Patient received empiric IV antibiotics in the ER and was manually disimpacted by the ER physician. He will be admitted to the hospital for further evaluation.    Review of Systems: As per HPI otherwise all other systems reviewed and negative.    Past Medical History:  Diagnosis Date  . Diabetes ([place])   . HTN (hypertension)   . Hyperlipidemia   . Psychosis ([place])   . Seizure [facility])     Past Surgical History:  Procedure Laterality Date  . COLONOSCOPY WITH PROPOFOL N/A 12/22/2018   Procedure: COLONOSCOPY WITH PROPOFOL;  Surgeon: Virgel Manifold, MD;  Location: [facility] ENDOSCOPY;  Service: Endoscopy;  Laterality: N/A;  . COLONOSCOPY WITH PROPOFOL N/A 12/23/2018   Procedure: COLONOSCOPY WITH PROPOFOL;  Surgeon: Virgel Manifold, MD;  Location: [facility] ENDOSCOPY;  Service: Endoscopy;  Laterality: N/A;  . ESOPHAGOGASTRODUODENOSCOPY (EGD) WITH PROPOFOL N/A 12/22/2018   Procedure: ESOPHAGOGASTRODUODENOSCOPY (EGD) WITH PROPOFOL;  Surgeon: Virgel Manifold, MD;  Location: [facility] ENDOSCOPY;  Service: Endoscopy;  Laterality: N/A;     reports that he has never smoked. He has never used smokeless tobacco. He reports previous drug use. He reports that he does not drink alcohol.  Allergies  Allergen  Reactions  . Lisinopril Other (See Comments) and Anaphylaxis    Unknown Unknown    Family History  Family history unknown: Yes      Prior to Admission medications   Medication Sig Start Date End Date Taking?  Authorizing Provider  acetaminophen (TYLENOL) 325 MG tablet Take 650 mg by mouth every 6 (six) hours as needed.    [provider]  ARIPiprazole (ABILIFY) 15 MG tablet Take 15 mg by mouth daily.    [provider]  aspirin EC 81 MG tablet Take 81 mg by mouth daily.    [provider]  clonazePAM (KLONOPIN) 1 MG tablet Take 1 mg by mouth at bedtime.    [provider]  gabapentin (NEURONTIN) 300 MG capsule Take 300 mg by mouth 3 (three) times daily.  10/25/18   [provider]  gabapentin (NEURONTIN) 600 MG tablet Take 600 mg by mouth 3 (three) times daily. 07/05/19   [provider]  guaiFENesin-dextromethorphan (ROBITUSSIN DM) 100-10 MG/5ML syrup Take 10 mLs by mouth every 6 (six) hours as needed for cough.     [provider]  hydrocortisone cream 1 % Apply 1 application topically 2 (two) times daily as needed.     [provider]  loratadine (CLARITIN) 10 MG tablet Take 10 mg by mouth daily.    [provider]  metFORMIN (GLUCOPHAGE) 500 MG tablet Take 500 mg by mouth 2 (two) times daily with a meal.    [provider]  mirtazapine (REMERON) 15 MG tablet Take 15 mg by mouth at bedtime. 03/23/19   [provider]  phenytoin (DILANTIN) 100 MG ER capsule Take 100-300 mg by mouth 2 (two) times daily. Take 100 mg by mouth in the morning and 300 mg by mouth at bedtime.    [provider]  polyethylene glycol (MIRALAX / GLYCOLAX) 17 g packet Take 17 g by mouth 2 (two) times daily. 07/17/19   Elodia Florence., MD  potassium chloride SA (KLOR-CON) 20 MEQ tablet Take 1 tablet (20 mEq total) by mouth daily. 04/11/19   Danford, Suann Larry, MD  psyllium (METAMUCIL) 58.6 % powder Take 1 packet by mouth 3 (three) times daily as needed.     [provider]  QUEtiapine (SEROQUEL) 400 MG tablet Take 800 mg by mouth at bedtime.    [provider]  simvastatin (ZOCOR) 20 MG tablet Take 20  mg by mouth at bedtime.    [provider]  tamsulosin (FLOMAX) 0.4 MG CAPS capsule TAKE 1 CAPSULE BY MOUTH ONCE DAILY 06/28/20   Debroah Loop, PA-C  traZODone (DESYREL) 100 MG tablet Take 100 mg by mouth at bedtime.     [provider]    Physical Exam: Vitals:   10/03/20 1430 10/03/20 1500 10/03/20 1530 10/03/20 1543  BP: 133/79 102/89 134/79   Pulse: 95 96 94   Resp: 19  17   Temp:    99.8 F (37.7 C)  TempSrc:    Oral  SpO2: 98% 100% 99%   Weight:      Height:         Vitals:   10/03/20 1430 10/03/20 1500 10/03/20 1530 10/03/20 1543  BP: 133/79 102/89 134/79   Pulse: 95 96 94   Resp: 19  17   Temp:    99.8 F (37.7 C)  TempSrc:    Oral  SpO2: 98% 100% 99%   Weight:      Height:  Constitutional: Alert and oriented to person. Not in any apparent distress HEENT:      Head: Normocephalic and atraumatic.         Eyes: PERLA, EOMI, Conjunctivae are normal. Sclera is non-icteric.       Mouth/Throat: Mucous membranes are moist.       Neck: Supple with no signs of meningismus. Cardiovascular:  Tachycardic. No murmurs, gallops, or rubs. 2+ symmetrical distal pulses are present . No JVD. No LE edema Respiratory:  Tachypnea.bilateral air entry in both lung fields.  Scattered rhonchi Gastrointestinal: Soft, non tender, and non distended with positive bowel sounds.  Genitourinary: No CVA tenderness. Musculoskeletal: Nontender with normal range of motion in all extremities. No cyanosis, or erythema of extremities. Neurologic:  Face is symmetric. Moving all extremities. No gross focal neurologic deficits.  Generalized weakness Skin: Skin is warm, dry.  No rash or ulcers Psychiatric: Mood and affect are normal   Labs on Admission: I have personally reviewed following labs and imaging studies  CBC: Recent Labs  Lab 10/03/20 1205  WBC 6.5  NEUTROABS 5.0  HGB 11.3*  HCT 34.8*  MCV 91.1  PLT 716*   Basic Metabolic Panel: Recent Labs   Lab 10/03/20 1205  NA 134*  K 4.4  CL 99  CO2 25  GLUCOSE 215*  BUN 28*  CREATININE 1.72*  CALCIUM 8.7*   GFR: Estimated Creatinine Clearance: 60.5 mL/min (A) (by C-G formula based on SCr of 1.72 mg/dL (H)). Liver Function Tests: Recent Labs  Lab 10/03/20 1205  AST 46*  ALT 38  ALKPHOS 88  BILITOT 0.7  PROT 7.9  ALBUMIN 4.0   No results for input(s): LIPASE, AMYLASE in the last 168 hours. No results for input(s): AMMONIA in the last 168 hours. Coagulation Profile: Recent Labs  Lab 10/03/20 1205  INR 1.1   Cardiac Enzymes: No results for input(s): CKTOTAL, CKMB, CKMBINDEX, TROPONINI in the last 168 hours. BNP (last 3 results) No results for input(s): PROBNP in the last 8760 hours. HbA1C: No results for input(s): HGBA1C in the last 72 hours. CBG: No results for input(s): GLUCAP in the last 168 hours. Lipid Profile: No results for input(s): CHOL, HDL, LDLCALC, TRIG, CHOLHDL, LDLDIRECT in the last 72 hours. Thyroid Function Tests: No results for input(s): TSH, T4TOTAL, FREET4, T3FREE, THYROIDAB in the last 72 hours. Anemia Panel: No results for input(s): VITAMINB12, FOLATE, FERRITIN, TIBC, IRON, RETICCTPCT in the last 72 hours. Urine analysis:    Component Value Date/Time   COLORURINE YELLOW (A) 10/03/2020 1245   APPEARANCEUR CLEAR (A) 10/03/2020 1245   APPEARANCEUR Clear 11/30/2019 1059   LABSPEC 1.013 10/03/2020 1245   PHURINE 5.0 10/03/2020 1245   GLUCOSEU >=500 (A) 10/03/2020 1245   HGBUR MODERATE (A) 10/03/2020 1245   BILIRUBINUR NEGATIVE 10/03/2020 1245   BILIRUBINUR Negative 11/30/2019 1059   KETONESUR NEGATIVE 10/03/2020 1245   PROTEINUR 30 (A) 10/03/2020 1245   NITRITE NEGATIVE 10/03/2020 1245   LEUKOCYTESUR NEGATIVE 10/03/2020 1245    Radiological Exams on Admission: CT Abdomen Pelvis Wo Contrast  Result Date: 10/03/2020 CLINICAL DATA:  Weakness, urinary incontinence EXAM: CT ABDOMEN AND PELVIS WITHOUT CONTRAST TECHNIQUE: Multidetector CT  imaging of the abdomen and pelvis was performed following the standard protocol without IV contrast. COMPARISON:  Ultrasound 07/14/2019, CT 07/12/2019 FINDINGS: Lower chest: Bibasilar atelectasis. Heart size is normal. Extensive coronary artery calcification. Hepatobiliary: Stable subcentimeter low-density lesion within the right hepatic dome, likely cyst. No hyperdense gallstone. No biliary dilatation. Pancreas: Grossly unremarkable. Spleen: Normal in  size without focal abnormality. Adrenals/Urinary Tract: Unremarkable adrenal glands. 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 No renal or ureteral calculus is identified. Urinary bladder is within normal limits. Stomach/Bowel: Stomach is unremarkable. No dilated loops of small bowel. Large volume of stool throughout the colon including rectal stool ball measuring 7.5 x 7.5 cm in diameter with associated mild rectal wall thickening. No pneumatosis. Vascular/Lymphatic: No significant vascular findings are evident on noncontrast exam. No enlarged abdominal or pelvic lymph nodes. Reproductive: Mildly prominent prostate gland, similar to prior. Other: No free fluid. No abdominopelvic fluid collection. No pneumoperitoneum. Small fat containing umbilical hernia. Musculoskeletal: Unchanged retrolisthesis of L5 on S1. Multilevel degenerative changes of the thoracic and lumbar spine. No new or acute osseous findings. IMPRESSION: 1. Large volume of stool throughout the colon including large rectal stool ball with associated mild rectal wall thickening. Findings are suggestive of constipation and possible fecal impaction. 2. Mild-moderate left hydroureteronephrosis, similar in appearance to prior study. No renal or ureteral calculus is identified. A component of mechanical obstruction related to rectosigmoid stool burden is a consideration. 3. Coronary artery atherosclerosis.  Electronically Signed   By: Davina Poke D.O.   On: 10/03/2020 14:41   DG Chest Port 1 View  Result Date: 10/03/2020 CLINICAL DATA:  Question sepsis, diabetes mellitus, hypertension EXAM: PORTABLE CHEST 1 VIEW COMPARISON:  Portable exam 1155 hours compared to 07/12/2019 FINDINGS: Upper normal heart size. Mediastinal contours and pulmonary vascularity normal. Peribronchial thickening with mild RIGHT basilar atelectasis unchanged. Remaining lungs clear. No definite pulmonary infiltrate, pleural effusion, or pneumothorax. Scattered endplate spur formation thoracic spine. IMPRESSION: Bronchitic changes with persistent RIGHT basilar atelectasis. Electronically Signed   By: Lavonia Dana M.D.   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  Pneumonia Patient presents for evaluation of a nonproductive cough associated with fever, tachypnea, tachycardia, lactic acidosis and chest x-ray findings suggestive of bronchitis. Will obtain CT chest without contrast for further evaluation Aggressive IV fluid resuscitation Place patient on empiric antibiotic therapy with Rocephin and Zithromax Follow-up results of blood cultures     Diabetes mellitus with stage II chronic kidney disease Maintain consistent carbohydrate diet Glycemic control with sliding scale insulin Hold metformin   History of seizures Patient on seizure precautions Continue Dilantin    Obesity Complicates overall prognosis and care    History of psychosis Continue Seroquel, clonazepam, Remeron trazodone and Abilify     Generalized weakness Place patient on fall  precautions Physical therapy evaluation once acute illness resolves   DVT prophylaxis: Lovenox Code Status: full code Family Communication: Greater than 50% of time was spent discussing patient's condition and plan of care with his caregiver, Ms Paulene Floor over the phone.  All questions and concerns have been addressed.  She verbalizes understanding and agrees with the plan. Disposition Plan: Back to previous home environment Consults called: none Status: At the time of admission, it appears that the appropriate admission status for this patient is inpatient. This is judged to be reasonable and necessary in order to provide the required intensity of service to  ensure the patient's safety given the presenting symptoms, physical exam findings and initial radiographic and laboratory data in the context of their comorbid conditions. Patient requires inpatient status due to high intensity of service, high risk for further deterioration and high frequency of surveillance required.    Collier Bullock MD [facility]     10/03/2020, 3:43 PM

## 2020-10-03 NOTE — ED Notes (Signed)
EDP at bedside for manual disempaction.

## 2020-10-03 NOTE — Sepsis Progress Note (Signed)
Sepsis protocol being followed by [facility] 

## 2020-10-03 NOTE — Progress Notes (Signed)
CODE SEPSIS - PHARMACY COMMUNICATION  **Broad Spectrum Antibiotics should be administered within 1 hour of Sepsis diagnosis**  Time Code Sepsis Called/Page Received: 1127  Antibiotics Ordered: vancomycin/cefepime/metronidazole  Time of 1st antibiotic administration: [place] ,PharmD Clinical Pharmacist  10/03/2020  12:23 PM

## 2020-10-03 NOTE — Progress Notes (Signed)
PHARMACY -  BRIEF ANTIBIOTIC NOTE   Pharmacy has received consult(s) for cefepime and vancomycin from an ED provider. The patient's profile has been reviewed for ht/wt/allergies/indication/available labs.    One time order(s) placed for vancomycin 1 g + cefepime 2 g  Further antibiotics/pharmacy consults should be ordered by admitting physician if indicated.                       Thank you,  Tawnya Crook, PharmD 10/03/2020  11:36 AM

## 2020-10-03 NOTE — Progress Notes (Signed)
Due to cognitive limitations - unable to complete admission questions at this time.

## 2020-10-03 NOTE — ED Provider Notes (Signed)
[facility] Emergency Department Provider Note   ____________________________________________   Event Date/Time   First MD Initiated Contact with Patient 10/03/20 1114     (approximate)  I have reviewed the triage vital signs and the nursing notes.   HISTORY  Chief Complaint Weakness    HPI Rock Sobol is a 60 y.o. male with past medical history of hypertension, hyperlipidemia, diabetes, CKD, seizures, and psychosis who presents to the ED for generalized weakness.  History is limited due to patient's cognitive deficits.  Per EMS, patient resides at a group home and was brought to his doctor's office earlier today due to weakness and recent urinary incontinence.  Patient noted to be febrile and tachycardic at his doctor's office and EMS was called for transport to the ED.  Patient states he feels generally weak but denies any specific complaints, does cough frequently during my evaluation.  He later endorses chest pain and shortness of breath, denies abdominal pain, nausea, vomiting, or diarrhea.  He states that he has been unable to walk for the past couple of days, which is unusual for him.        Past Medical History:  Diagnosis Date  . Diabetes ([place])   . HTN (hypertension)   . Hyperlipidemia   . Psychosis ([place])   . Seizure [facility])     Patient Active Problem List   Diagnosis Date Noted  . Bacteremia due to Escherichia coli   . Generalized weakness   . Lactic acid acidosis 04/10/2019  . Left leg weakness 04/09/2019  . CKD (chronic kidney disease), stage III ([place]) 04/09/2019  . Hypokalemia 04/09/2019  . Anemia due to chronic kidney disease 04/09/2019  . Essential hypertension 04/09/2019  . Hyperlipidemia   . Sepsis ([place])   . Acute lower UTI   . Diabetes mellitus due to underlying condition with stage 2 chronic kidney disease, with long-term current use of insulin ([place])   . AKI (acute kidney injury) ([place])   . Obesity (BMI 30-39.9)   .  Psychosis ([place])   . Seizure disorder ([place])   . Rectal polyp   . Ectopic gastric tissue   . Stomach irritation   . Loss of weight   . Colon cancer screening   . Edema of colon   . Septic shock ([place]) 01/08/2017    Past Surgical History:  Procedure Laterality Date  . COLONOSCOPY WITH PROPOFOL N/A 12/22/2018   Procedure: COLONOSCOPY WITH PROPOFOL;  Surgeon: Virgel Manifold, MD;  Location: [facility] ENDOSCOPY;  Service: Endoscopy;  Laterality: N/A;  . COLONOSCOPY WITH PROPOFOL N/A 12/23/2018   Procedure: COLONOSCOPY WITH PROPOFOL;  Surgeon: Virgel Manifold, MD;  Location: [facility] ENDOSCOPY;  Service: Endoscopy;  Laterality: N/A;  . ESOPHAGOGASTRODUODENOSCOPY (EGD) WITH PROPOFOL N/A 12/22/2018   Procedure: ESOPHAGOGASTRODUODENOSCOPY (EGD) WITH PROPOFOL;  Surgeon: Virgel Manifold, MD;  Location: [facility] ENDOSCOPY;  Service: Endoscopy;  Laterality: N/A;    Prior to Admission medications   Medication Sig Start Date End Date Taking? Authorizing Provider  acetaminophen (TYLENOL) 325 MG tablet Take 650 mg by mouth every 6 (six) hours as needed.    [provider]  ARIPiprazole (ABILIFY) 15 MG tablet Take 15 mg by mouth daily.    [provider]  aspirin EC 81 MG tablet Take 81 mg by mouth daily.    [provider]  clonazePAM (KLONOPIN) 1 MG tablet Take 1 mg by mouth at bedtime.    [provider]  gabapentin (NEURONTIN) 300 MG capsule Take 300  mg by mouth 3 (three) times daily.  10/25/18   [provider]  gabapentin (NEURONTIN) 600 MG tablet Take 600 mg by mouth 3 (three) times daily. 07/05/19   [provider]  guaiFENesin-dextromethorphan (ROBITUSSIN DM) 100-10 MG/5ML syrup Take 10 mLs by mouth every 6 (six) hours as needed for cough.     [provider]  hydrocortisone cream 1 % Apply 1 application topically 2 (two) times daily as needed.     [provider]  loratadine (CLARITIN) 10 MG tablet Take 10 mg by mouth daily.     [provider]  metFORMIN (GLUCOPHAGE) 500 MG tablet Take 500 mg by mouth 2 (two) times daily with a meal.    [provider]  mirtazapine (REMERON) 15 MG tablet Take 15 mg by mouth at bedtime. 03/23/19   [provider]  phenytoin (DILANTIN) 100 MG ER capsule Take 100-300 mg by mouth 2 (two) times daily. Take 100 mg by mouth in the morning and 300 mg by mouth at bedtime.    [provider]  polyethylene glycol (MIRALAX / GLYCOLAX) 17 g packet Take 17 g by mouth 2 (two) times daily. 07/17/19   Elodia Florence., MD  potassium chloride SA (KLOR-CON) 20 MEQ tablet Take 1 tablet (20 mEq total) by mouth daily. 04/11/19   Danford, Suann Larry, MD  psyllium (METAMUCIL) 58.6 % powder Take 1 packet by mouth 3 (three) times daily as needed.     [provider]  QUEtiapine (SEROQUEL) 400 MG tablet Take 800 mg by mouth at bedtime.    [provider]  simvastatin (ZOCOR) 20 MG tablet Take 20 mg by mouth at bedtime.    [provider]  tamsulosin (FLOMAX) 0.4 MG CAPS capsule TAKE 1 CAPSULE BY MOUTH ONCE DAILY 06/28/20   Debroah Loop, PA-C  traZODone (DESYREL) 100 MG tablet Take 100 mg by mouth at bedtime.     [provider]    Allergies Lisinopril  Family History  Family history unknown: Yes    Social History Social History   Tobacco Use  . Smoking status: Never Smoker  . Smokeless tobacco: Never Used  Vaping Use  . Vaping Use: Never used  Substance Use Topics  . Alcohol use: No  . Drug use: Not Currently    Review of Systems  Constitutional: Positive for fever/chills.  Positive for generalized weakness. Eyes: No visual changes. ENT: No sore throat. Cardiovascular: Positive for chest pain. Respiratory: Positive for cough and shortness of breath. Gastrointestinal: No abdominal pain.  No nausea, no vomiting.  No diarrhea.  No constipation. Genitourinary: Negative for dysuria.  Positive for urinary  incontinence. Musculoskeletal: Negative for back pain. Skin: Negative for rash. Neurological: Negative for headaches, focal weakness or numbness.  ____________________________________________   PHYSICAL EXAM:  VITAL SIGNS: ED Triage Vitals  Enc Vitals Group     BP 10/03/20 1117 (!) 164/81     Pulse Rate 10/03/20 1117 (!) 122     Resp 10/03/20 1117 (!) 24     Temp 10/03/20 1117 (!) 103.4 F (39.7 C)     Temp Source 10/03/20 1117 Oral     SpO2 10/03/20 1117 93 %     Weight 10/03/20 1120 250 lb (113.4 kg)     Height 10/03/20 1120 6\' 3"  (1.905 m)     Head Circumference --      Peak Flow --      Pain Score 10/03/20 1119 4     Pain  Loc --      Pain Edu? --      Excl. in [place]? --     Constitutional: Alert and oriented to person and place, but not time. Eyes: Conjunctivae are normal.  Pupils equal round and reactive to light bilaterally. Head: Atraumatic. Nose: No congestion/rhinnorhea. Mouth/Throat: Mucous membranes are moist. Neck: Normal ROM Cardiovascular: Tachycardic, regular rhythm. Grossly normal heart sounds. Respiratory: Mildly tachypneic with normal respiratory effort.  No retractions. Lungs CTAB. Gastrointestinal: Soft and nontender. No distention. Genitourinary: deferred Musculoskeletal: No lower extremity tenderness nor edema. Neurologic:  Normal speech and language.  Globally weak with no gross focal neurologic deficits appreciated. Skin:  Skin is warm, dry and intact. No rash noted. Psychiatric: Mood and affect are normal. Speech and behavior are normal.  ____________________________________________   LABS (all labs ordered are listed, but only abnormal results are displayed)  Labs Reviewed  LACTIC ACID, PLASMA - Abnormal; Notable for the following components:      Result Value   Lactic Acid, Venous 2.8 (*)    All other components within normal limits  COMPREHENSIVE METABOLIC PANEL - Abnormal; Notable for the following components:   Sodium 134 (*)     Glucose, Bld 215 (*)    BUN 28 (*)    Creatinine, Ser 1.72 (*)    Calcium 8.7 (*)    AST 46 (*)    GFR, Estimated 44 (*)    All other components within normal limits  CBC WITH DIFFERENTIAL/PLATELET - Abnormal; Notable for the following components:   RBC 3.82 (*)    Hemoglobin 11.3 (*)    HCT 34.8 (*)    Platelets 117 (*)    Lymphs Abs 0.6 (*)    All other components within normal limits  URINALYSIS, COMPLETE (UACMP) WITH MICROSCOPIC - Abnormal; Notable for the following components:   Color, Urine YELLOW (*)    APPearance CLEAR (*)    Glucose, UA >=500 (*)    Hgb urine dipstick MODERATE (*)    Protein, ur 30 (*)    All other components within normal limits  TROPONIN I (HIGH SENSITIVITY) - Abnormal; Notable for the following components:   Troponin I (High Sensitivity) 18 (*)    All other components within normal limits  RESP PANEL BY RT-PCR (FLU A&B, COVID) ARPGX2  CULTURE, BLOOD (ROUTINE X 2)  CULTURE, BLOOD (ROUTINE X 2)  URINE CULTURE  LACTIC ACID, PLASMA  PROTIME-INR  APTT  PROCALCITONIN  HIV ANTIBODY (ROUTINE TESTING W REFLEX)  HEMOGLOBIN A1C  TROPONIN I (HIGH SENSITIVITY)   ____________________________________________  EKG  ED ECG REPORT I, Blake Divine, the attending physician, personally viewed and interpreted this ECG.   Date: 10/03/2020  EKG Time: 11:27  Rate: 114  Rhythm: sinus tachycardia  Axis: Normal  Intervals:right bundle branch block  ST&T Change: None   PROCEDURES  Procedure(s) performed (including Critical Care):  .Critical Care Performed by: Blake Divine, MD Authorized by: Blake Divine, MD   Critical care provider statement:    Critical care time (minutes):  45   Critical care time was exclusive of:  Separately billable procedures and treating other patients and teaching time   Critical care was necessary to treat or prevent imminent or life-threatening deterioration of the following conditions:  Sepsis   Critical care was time  spent personally by me on the following activities:  Discussions with consultants, evaluation of patient's response to treatment, examination of patient, ordering and performing treatments and interventions, ordering and review of laboratory studies, ordering  and review of radiographic studies, pulse oximetry, re-evaluation of patient's condition, obtaining history from patient or surrogate and review of old charts   I assumed direction of critical care for this patient from another provider in my specialty: no     Care discussed with: admitting provider     ------------------------------------------------------------------------------------------------------------------- Fecal Disimpaction Procedure Note:  Performed by me:  Patient placed in the lateral recumbent position with knees drawn towards chest. Nurse present for patient support. Large amount of hard brown stool removed. No complications during procedure.   ------------------------------------------------------------------------------------------------------------------   ____________________________________________   INITIAL IMPRESSION / ASSESSMENT AND PLAN / ED COURSE       60 year old male with past medical history of hypertension, hyperlipidemia, diabetes, CKD, seizures, and psychosis who presents to the ED for generalized weakness and recent urinary incontinence, was noted to be febrile and tachycardic at his doctor's office.  Given his fever, tachycardia, and mild tachypnea, presentation is concerning for sepsis.  We will initiate IV fluid resuscitation and broad-spectrum antibiotics, would consider pneumonia or UTI given his symptomatology, however no abdominal tenderness noted on exam.  Patient is slightly disoriented but does have cognitive difficulties at baseline and his altered mentation is likely due to sepsis, no focal neurologic deficits noted.  Chest x-ray reviewed by me and shows no focal infiltrate, edema, or  effusion.  UA also shows no signs of infection.  Labs show mild AKI as well as elevated procalcitonin, consistent with sepsis.  No obvious source at this time and CT abdomen/pelvis was performed.  This showed significant constipation but no evidence of infectious process.  Fecal disimpaction was performed, case discussed with hospitalist for admission for further management of sepsis.      ____________________________________________   FINAL CLINICAL IMPRESSION(S) / ED DIAGNOSES  Final diagnoses:  Sepsis without acute organ dysfunction, due to unspecified organism [facility])     ED Discharge Orders    None       Note:  This document was prepared using Dragon voice recognition software and may include unintentional dictation errors.   Blake Divine, MD 10/03/20 [id number]

## 2020-10-03 NOTE — ED Notes (Signed)
Pt attempted to void 2 times. Not able to yet.

## 2020-10-04 DIAGNOSIS — E0822 Diabetes mellitus due to underlying condition with diabetic chronic kidney disease: Secondary | ICD-10-CM

## 2020-10-04 DIAGNOSIS — J189 Pneumonia, unspecified organism: Secondary | ICD-10-CM

## 2020-10-04 DIAGNOSIS — E669 Obesity, unspecified: Secondary | ICD-10-CM | POA: Diagnosis not present

## 2020-10-04 DIAGNOSIS — Z794 Long term (current) use of insulin: Secondary | ICD-10-CM

## 2020-10-04 DIAGNOSIS — A419 Sepsis, unspecified organism: Secondary | ICD-10-CM | POA: Diagnosis not present

## 2020-10-04 DIAGNOSIS — N182 Chronic kidney disease, stage 2 (mild): Secondary | ICD-10-CM

## 2020-10-04 DIAGNOSIS — I10 Essential (primary) hypertension: Secondary | ICD-10-CM | POA: Diagnosis not present

## 2020-10-04 LAB — CBC
HCT: 30.6 % — ABNORMAL LOW (ref 39.0–52.0)
Hemoglobin: 9.7 g/dL — ABNORMAL LOW (ref 13.0–17.0)
MCH: 29.3 pg (ref 26.0–34.0)
MCHC: 31.7 g/dL (ref 30.0–36.0)
MCV: 92.4 fL (ref 80.0–100.0)
Platelets: 109 10*3/uL — ABNORMAL LOW (ref 150–400)
RBC: 3.31 MIL/uL — ABNORMAL LOW (ref 4.22–5.81)
RDW: 12.8 % (ref 11.5–15.5)
WBC: 5 10*3/uL (ref 4.0–10.5)
nRBC: 0 % (ref 0.0–0.2)

## 2020-10-04 LAB — URINE CULTURE: Culture: <10000 — AB

## 2020-10-04 LAB — HIV ANTIBODY (ROUTINE TESTING W REFLEX): HIV Screen 4th Generation wRfx: NONREACTIVE

## 2020-10-04 LAB — BASIC METABOLIC PANEL
Anion gap: 8 (ref 5–15)
BUN: 25 mg/dL — ABNORMAL HIGH (ref 8–23)
CO2: 26 mmol/L (ref 22–32)
Calcium: 8.1 mg/dL — ABNORMAL LOW (ref 8.9–10.3)
Chloride: 104 mmol/L (ref 98–111)
Creatinine, Ser: 1.75 mg/dL — ABNORMAL HIGH (ref 0.61–1.24)
GFR, Estimated: 43 mL/min — ABNORMAL LOW (ref >60)
Glucose, Bld: 120 mg/dL — ABNORMAL HIGH (ref 70–99)
Potassium: 4 mmol/L (ref 3.5–5.1)
Sodium: 138 mmol/L (ref 135–145)

## 2020-10-04 LAB — PROCALCITONIN: Procalcitonin: 1.69 ng/mL

## 2020-10-04 LAB — PROTIME-INR
INR: 1.1 (ref 0.8–1.2)
Prothrombin Time: 14.6 seconds (ref 11.4–15.2)

## 2020-10-04 LAB — GLUCOSE, CAPILLARY
Glucose-Capillary: 119 mg/dL — ABNORMAL HIGH (ref 70–99)
Glucose-Capillary: 184 mg/dL — ABNORMAL HIGH (ref 70–99)
Glucose-Capillary: 129 mg/dL — ABNORMAL HIGH (ref 70–99)
Glucose-Capillary: 132 mg/dL — ABNORMAL HIGH (ref 70–99)

## 2020-10-04 LAB — CORTISOL-AM, BLOOD: Cortisol - AM: 10.6 ug/dL (ref 6.7–22.6)

## 2020-10-04 MED ORDER — DOCUSATE SODIUM 100 MG PO CAPS
100.0000 mg | ORAL_CAPSULE | Freq: Two times a day (BID) | ORAL | Status: DC
  Administered 2020-10-04 – 2020-10-05 (×3): 100 mg via ORAL
  Filled 2020-10-04 (×3): qty 1

## 2020-10-04 MED ORDER — BISACODYL 10 MG RE SUPP: 10.0000 mg | Freq: Every day | RECTAL | Status: DC | PRN

## 2020-10-04 MED ORDER — AZITHROMYCIN 250 MG PO TABS
500.0000 mg | ORAL_TABLET | Freq: Every day | ORAL | Status: AC
  Administered 2020-10-04 – 2020-10-05 (×2): 500 mg via ORAL
  Filled 2020-10-04 (×2): qty 2

## 2020-10-04 NOTE — Evaluation (Addendum)
Clinical/Bedside Swallow Evaluation Patient Details  Name: Jason Reed MRN: [id number] Date of Birth: 12-01-1960  Today's Date: 10/04/2020 Time: SLP Start Time (ACUTE ONLY): 0850 SLP Stop Time (ACUTE ONLY): 0940 SLP Time Calculation (min) (ACUTE ONLY): 50 min  Past Medical History:  Past Medical History:  Diagnosis Date  . Diabetes ([place])   . HTN (hypertension)   . Hyperlipidemia   . Psychosis ([place])   . Seizure [facility])    Past Surgical History:  Past Surgical History:  Procedure Laterality Date  . COLONOSCOPY WITH PROPOFOL N/A 12/22/2018   Procedure: COLONOSCOPY WITH PROPOFOL;  Surgeon: Virgel Manifold, MD;  Location: [facility] ENDOSCOPY;  Service: Endoscopy;  Laterality: N/A;  . COLONOSCOPY WITH PROPOFOL N/A 12/23/2018   Procedure: COLONOSCOPY WITH PROPOFOL;  Surgeon: Virgel Manifold, MD;  Location: [facility] ENDOSCOPY;  Service: Endoscopy;  Laterality: N/A;  . ESOPHAGOGASTRODUODENOSCOPY (EGD) WITH PROPOFOL N/A 12/22/2018   Procedure: ESOPHAGOGASTRODUODENOSCOPY (EGD) WITH PROPOFOL;  Surgeon: Virgel Manifold, MD;  Location: [facility] ENDOSCOPY;  Service: Endoscopy;  Laterality: N/A;   HPI:  Pt is a 61 y.o. male with past medical history of Seizure dis., Psychosis, hypertension, hyperlipidemia, diabetes, CKD, and Cognitive Decline baseline who presents to the ED for generalized weakness.  History is limited due to patient's cognitive deficits.  Per EMS, patient resides at a [facility] and was brought to his doctor's office earlier today due to weakness and recent urinary incontinence.  Patient noted to be febrile and tachycardic at his doctor's office and EMS was called for transport to the ED.  Patient states he feels generally weak but denies any specific complaints, does cough frequently during my evaluation.  He later endorses chest pain and shortness of breath, denies abdominal pain, nausea, vomiting, or diarrhea.  He states that he has been unable to walk for the past couple of days,  which is unusual for him.  Findings are suggestive of constipation and possible fecal impaction which was addressed in the ED.  CT of Chest: "Ill-defined right upper lobe and left lower lobe pulmonary  opacities, suspect infectious/inflammatory etiology.".   Assessment / Plan / Recommendation Clinical Impression  Pt appears to present w/ grossly adequate oropharyngeal phase swallow w/ No overt oropharyngeal phase dysphagia noted, No neuromuscular deficits noted. Pt consumed po trials w/ No overt, clinical s/s of aspiration during po trials. Pt has min presentation of Prognathism (lower mandible protrusion), Missing Dentition, and Baseline Cognitive decline. However, pt appears at reduced risk for aspiration following general aspiration precautions. During po trials, pt consumed all consistencies w/ no overt coughing, decline in vocal quality, or change in respiratory presentation during/post trials. Oral phase appeared adequate w/ timely bolus management, mastication, and control of bolus propulsion for A-P transfer for swallowing. Oral clearing achieved w/ all trial consistencies. He tended to masticate soft solids quickly then swallow -- this was consistent w/ all soft solid trials so suspect Baseline behavior. OM Exam appeared grossly [facility] w/ no unilateral lingual weakness noted. Speech Clear though muttered and quick. Pt fed self w/ setup support.   Recommend continue a Regular consistency diet w/ well-Cut meats, moistened foods; Thin liquids. Recommend general aspiration precautions, Pills w/ water but 1-2 at a time. (of note, pt consumed PIlls during this session w/ NSG present w/ water -- a several at one time.) Education given on 1-2 Pills swallowed at a time for safety, use of Puree if needed; food consistencies and easy to eat options d/t missing Dentition; general aspiration precautions. NSG to reconsult  if any new needs arise. NSG agreed. SLP Visit Diagnosis: Dysphagia, unspecified (R13.10)     Aspiration Risk   (reduced following precautions)    Diet Recommendation   Regular consistency diet w/ well-Cut meats, moistened foods dt/ Missing Dentition; Thin liquids. Recommend general aspiration precautions. Tray setup as needed.  Medication Administration: Whole meds with liquid (monitored by NSG)    Other  Recommendations Recommended Consults:  (n/a) Oral Care Recommendations: Oral care BID;Staff/trained caregiver to provide oral care Other Recommendations:  (n/a)   Follow up Recommendations None      Frequency and Duration  (n/a)   (n/a)       Prognosis Prognosis for Safe Diet Advancement: Good Barriers to Reach Goals: Cognitive deficits;Language deficits;Time post onset (Baseline)      Swallow Study   General Date of Onset: 10/03/20 HPI: Pt is a 60 y.o. male with past medical history of Seizure dis., Psychosis, hypertension, hyperlipidemia, diabetes, CKD, and Cognitive Decline baseline who presents to the ED for generalized weakness.  History is limited due to patient's cognitive deficits.  Per EMS, patient resides at a [facility] and was brought to his doctor's office earlier today due to weakness and recent urinary incontinence.  Patient noted to be febrile and tachycardic at his doctor's office and EMS was called for transport to the ED.  Patient states he feels generally weak but denies any specific complaints, does cough frequently during my evaluation.  He later endorses chest pain and shortness of breath, denies abdominal pain, nausea, vomiting, or diarrhea.  He states that he has been unable to walk for the past couple of days, which is unusual for him.  Findings are suggestive of constipation and possible fecal impaction which was addressed in the ED.  CT of Chest: "Ill-defined right upper lobe and left lower lobe pulmonary  opacities, suspect infectious/inflammatory etiology.". Type of Study: Bedside Swallow Evaluation Previous Swallow Assessment: none Diet Prior to  this Study: Regular;Thin liquids Temperature Spikes Noted: No (wbc 5.0) Respiratory Status: Room air History of Recent Intubation: No Behavior/Cognition: Alert;Cooperative;Pleasant mood;Confused;Distractible;Requires cueing (A/O to name, place -- suspect baseline) Oral Cavity Assessment: Dry (min) Oral Care Completed by SLP: Yes Oral Cavity - Dentition: Missing dentition (several lower teeth present) Vision: Functional for self-feeding Self-Feeding Abilities: Able to feed self;Needs set up Patient Positioning: Upright in bed (needed min positioning support/correction) Baseline Vocal Quality: Normal (muttered speech) Volitional Cough: Strong;Congested Volitional Swallow: Able to elicit    Oral/Motor/Sensory Function Overall Oral Motor/Sensory Function: Within functional limits   Ice Chips Ice chips: Within functional limits Presentation: Spoon (fed 2 trials)   Thin Liquid Thin Liquid: Within functional limits Presentation: Cup;Self Fed;Straw (~8 ozs)    Nectar Thick Nectar Thick Liquid: Not tested   Honey Thick Honey Thick Liquid: Not tested   Puree Puree: Within functional limits Presentation: Self Fed;Spoon (3+ ozs)   Solid     Solid: Within functional limits (soft solids, moistened) Presentation: Self Fed;Spoon (12+ trials of eggs, bacon, grits) Other Comments: missing Dentition - brief mastication noted at times        Orinda Kenner, [place], [place] [phone] Catilyn Boggus 10/04/2020,9:40 AM

## 2020-10-04 NOTE — TOC Initial Note (Addendum)
Transition of Care [facility]) - Initial/Assessment Note    Patient Details  Name: Jason Reed MRN: [id number] Date of Birth: [birth year]  [facility] CM/SW Contact:    Alberteen Sam, LCSW Phone Number: 10/04/2020, 3:07 PM  Clinical Narrative:                  CSW spoke with group home coordinator and patient's guardian Clara. She confirms patient is resident at group home called [facility], states at time of discharge patient will be transported by group home and to call [phone].   Clara agreeable to home health services for patient to include RN and PT. Set up with [facility] agreeable to accept.   Expected Discharge Plan: Group Home Barriers to Discharge: Continued Medical Work up   Patient Goals and CMS Choice        Expected Discharge Plan and Services Expected Discharge Plan: Group Home       Living arrangements for the past 2 months: Group Home                                      Prior Living Arrangements/Services Living arrangements for the past 2 months: Group Home Lives with:: Self Patient language and need for interpreter reviewed:: Yes Do you feel safe going back to the place where you live?: Yes      Need for Family Participation in Patient Care: Yes (Comment) Care giver support system in place?: Yes (comment)   Criminal Activity/Legal Involvement Pertinent to Current Situation/Hospitalization: No - Comment as needed  Activities of Daily Living   ADL Screening (condition at time of admission) Patient's cognitive ability adequate to safely complete daily activities?: No Is the patient deaf or have difficulty hearing?: No Does the patient have difficulty seeing, even when wearing glasses/contacts?: No Does the patient have difficulty concentrating, remembering, or making decisions?: Yes Patient able to express need for assistance with ADLs?: Yes Does the patient have difficulty dressing or  bathing?: No Independently performs ADLs?: Yes (appropriate for developmental age) Does the patient have difficulty walking or climbing stairs?: Yes Weakness of Legs: None Weakness of Arms/Hands: None  Permission Sought/Granted         Permission granted to share info w AGENCY: home health  Permission granted to share info w Relationship: group home coordinator and guardian  Permission granted to share info w Contact Information: Estill Batten [phone]  Emotional Assessment       Orientation: : Oriented to Self Alcohol / Substance Use: Not Applicable Psych Involvement: No (comment)  Admission diagnosis:  Sepsis ([place]) [A41.9] Sepsis without acute organ dysfunction, due to unspecified organism [facility]) [A41.9] Patient Active Problem List   Diagnosis Date Noted  . Pneumonia due to infectious organism   . Bacteremia due to Escherichia coli   . Generalized weakness   . Lactic acid acidosis 04/10/2019  . Left leg weakness 04/09/2019  . CKD (chronic kidney disease), stage III ([place]) 04/09/2019  . Hypokalemia 04/09/2019  . Anemia due to chronic kidney disease 04/09/2019  . Essential hypertension 04/09/2019  . Hyperlipidemia   . Sepsis ([place])   . Acute lower UTI   . 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 AKI (acute kidney injury) ([place])   . Obesity (BMI 30-39.9)   . Psychosis ([place])   .  Seizure disorder ([place])   . Rectal polyp   . Ectopic gastric tissue   . Stomach irritation   . Loss of weight   . Colon cancer screening   . Edema of colon   . Septic shock ([place]) 01/08/2017   PCP:  Marguerita Merles, MD Pharmacy:   [place], [place] [place] [address] Phone: [phone] Fax: [place], [place] [address] [place] [address] Phone: [phone] Fax: [phone]     Social Determinants of Health  (SDOH) Interventions    Readmission Risk Interventions No flowsheet data found.

## 2020-10-04 NOTE — Progress Notes (Signed)
[facility] NAME: Jason Reed    MR#:  [id number]  DATE OF BIRTH:  [birth year]  SUBJECTIVE:   Came in with fever shortness of breath. Found to have pneumonia. Patient worked with speech therapy earlier did very well. Ate good breakfast. Remains afebrile. Overall clinically appears stable. No respiratory distress REVIEW OF SYSTEMS:   Review of Systems  Unable to perform ROS: Mental acuity   Tolerating Diet:yes Tolerating PT:   DRUG ALLERGIES:   Allergies  Allergen Reactions  . Lisinopril Other (See Comments) and Anaphylaxis    Unknown Unknown    VITALS:  Blood pressure 133/76, pulse 90, temperature 99.1 F (37.3 C), resp. rate 16, height 6\' 3"  (1.905 m), weight 104.7 kg, SpO2 100 %.  PHYSICAL EXAMINATION:   Physical Exam  GENERAL:  60 y.o.-year-old patient lying in the bed with no acute distress.  LUNGS: decreased breath sounds bilaterally, no wheezing, rales, rhonchi. No use of accessory muscles of respiration.  CARDIOVASCULAR: S1, S2 normal. No murmurs, rubs, or gallops.  ABDOMEN: Soft, nontender, nondistended. Bowel sounds present. No organomegaly or mass.  EXTREMITIES: No cyanosis, clubbing or edema b/l.    NEUROLOGIC: Cranial nerves II through XII are intact. No focal Motor or sensory deficits b/l.   PSYCHIATRIC:  patient is alert and oriented x 3.  SKIN: No obvious rash, lesion, or ulcer.   LABORATORY PANEL:  CBC Recent Labs  Lab 10/04/20 0521  WBC 5.0  HGB 9.7*  HCT 30.6*  PLT 109*    Chemistries  Recent Labs  Lab 10/03/20 1205 10/04/20 0521  NA 134* 138  K 4.4 4.0  CL 99 104  CO2 25 26  GLUCOSE 215* 120*  BUN 28* 25*  CREATININE 1.72* 1.75*  CALCIUM 8.7* 8.1*  AST 46*  --   ALT 38  --   ALKPHOS 88  --   BILITOT 0.7  --    Cardiac Enzymes No results for input(s): TROPONINI in the last 168 hours. RADIOLOGY:  CT Abdomen Pelvis Wo Contrast  Result Date: 10/03/2020 CLINICAL DATA:   Weakness, urinary incontinence EXAM: CT ABDOMEN AND PELVIS WITHOUT CONTRAST TECHNIQUE: Multidetector CT imaging of the abdomen and pelvis was performed following the standard protocol without IV contrast. COMPARISON:  Ultrasound 07/14/2019, CT 07/12/2019 FINDINGS: Lower chest: Bibasilar atelectasis. Heart size is normal. Extensive coronary artery calcification. Hepatobiliary: Stable subcentimeter low-density lesion within the right hepatic dome, likely cyst. No hyperdense gallstone. No biliary dilatation. Pancreas: Grossly unremarkable. Spleen: Normal in size without focal abnormality. Adrenals/Urinary Tract: Unremarkable adrenal glands. 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 No renal or ureteral calculus is identified. Urinary bladder is within normal limits. Stomach/Bowel: Stomach is unremarkable. No dilated loops of small bowel. Large volume of stool throughout the colon including rectal stool ball measuring 7.5 x 7.5 cm in diameter with associated mild rectal wall thickening. No pneumatosis. Vascular/Lymphatic: No significant vascular findings are evident on noncontrast exam. No enlarged abdominal or pelvic lymph nodes. Reproductive: Mildly prominent prostate gland, similar to prior. Other: No free fluid. No abdominopelvic fluid collection. No pneumoperitoneum. Small fat containing umbilical hernia. Musculoskeletal: Unchanged retrolisthesis of L5 on S1. Multilevel degenerative changes of the thoracic and lumbar spine. No new or acute osseous findings. IMPRESSION: 1. Large volume of stool throughout the colon including large rectal stool ball with associated mild rectal wall thickening. Findings are suggestive of constipation and possible fecal impaction.  2. Mild-moderate left hydroureteronephrosis, similar in appearance to prior study. No renal or ureteral calculus is identified. A component of mechanical  obstruction related to rectosigmoid stool burden is a consideration. 3. Coronary artery atherosclerosis. Electronically Signed   By: Davina Poke D.O.   On: 10/03/2020 14:41   CT CHEST WO CONTRAST  Result Date: 10/03/2020 CLINICAL DATA:  Weakness EXAM: CT CHEST WITHOUT CONTRAST TECHNIQUE: Multidetector CT imaging of the chest was performed following the standard protocol without IV contrast. COMPARISON:  CT 07/12/2019 and previous FINDINGS: Cardiovascular: Aberrant retroesophageal right subclavian artery, an anatomic variant. Extensive coronary calcifications. Heart size normal. No pericardial effusion. Mediastinum/Nodes: No mass or adenopathy. Lungs/Pleura: No pleural effusion. No pneumothorax. Poorly marginated airspace and ground-glass opacities in the apical and posterior segments right upper lobe, and in the anterior basal segment left lower lobe. Musculoskeletal: Anterior vertebral endplate spurring at multiple levels in the mid and lower thoracic spine. IMPRESSION: 1. Ill-defined right upper lobe and left lower lobe pulmonary opacities, suspect infectious/inflammatory etiology. 2. Coronary calcifications. Electronically Signed   By: Lucrezia Europe M.D.   On: 10/03/2020 16:11   DG Chest Port 1 View  Result Date: 10/03/2020 CLINICAL DATA:  Question sepsis, diabetes mellitus, hypertension EXAM: PORTABLE CHEST 1 VIEW COMPARISON:  Portable exam 1155 hours compared to 07/12/2019 FINDINGS: Upper normal heart size. Mediastinal contours and pulmonary vascularity normal. Peribronchial thickening with mild RIGHT basilar atelectasis unchanged. Remaining lungs clear. No definite pulmonary infiltrate, pleural effusion, or pneumothorax. Scattered endplate spur formation thoracic spine. IMPRESSION: Bronchitic changes with persistent RIGHT basilar atelectasis. Electronically Signed   By: Lavonia Dana M.D.   On: 10/03/2020 12:17   ASSESSMENT AND PLAN:  Jason Reed is a 60 y.o. male with medical history significant  for diabetes mellitus, hypertension, seizure disorder who was sent to the emergency room from his primary care provider's office after he was taken there by his caregiver for evaluation of weakness and urinary incontinence. Patient was noted to be febrile at his doctor's office with a T-max of 102, he was tachycardic with heart rate of 125 and hypoxic with respiratory rate of 28.    Sepsis (POA) --Most likely from a pulmonary source Aspiration Pneumonia (ct positive for PNA) --Patient presents for evaluation of a nonproductive cough associated with fever, tachypnea, tachycardia, lactic acidosis and chest x-ray findings suggestive of bronchitis. --Aggressive IV fluid resuscitation --cont IV with Rocephin and Zithromax --Follow-up results of blood cultures so far neg -- patient seen by speech therapy follow recommendations. 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 --Maintain consistent carbohydrate diet --Glycemic control with sliding scale insulin --Hold metformin  History of seizures Patient on seizure precautions Continue Dilantin  Severe constipation -- CT abdomen showed severe constipation with fecal impaction -- ER physician was able to disimpact stool -- will give daily bowel regimen  Obesity Complicates overall prognosis and care  History of psychosis Continue Seroquel, clonazepam, Remeron trazodone and Abilify patient resides at group home    DVT prophylaxis: Lovenox Code Status: full code Family Communication:  awaiting to speak with group home staff Disposition Plan: Back to previous home environment Level of care: Progressive Cardiac Status is: Inpatient  Continues to show improvement patient can discharge back to group home tomorrow      [place] THIS PATIENT: *25 minutes.  >50% time spent on counselling and coordination of care  Note: This dictation was prepared with Dragon dictation  along with smaller phrase  technology. Any transcriptional errors that result from this process are unintentional.  Fritzi Mandes M.D    [facility]   CC: Primary care physician; Marguerita Merles, MDPatient ID: [name], male   DOB: Jan 02, 1961, 60 y.o.   MRN: [id number]

## 2020-10-04 NOTE — Discharge Instructions (Signed)
Diet recommended at D/C:  Regular consistency diet w/ well-Cut meats, moistened foods d/t missing Dentition; Thin liquids slowly. Recommend general aspiration precautions, Pills w/ water but 1-2 at a time.

## 2020-10-05 DIAGNOSIS — I10 Essential (primary) hypertension: Secondary | ICD-10-CM | POA: Diagnosis not present

## 2020-10-05 DIAGNOSIS — A419 Sepsis, unspecified organism: Secondary | ICD-10-CM | POA: Diagnosis not present

## 2020-10-05 DIAGNOSIS — N179 Acute kidney failure, unspecified: Secondary | ICD-10-CM

## 2020-10-05 DIAGNOSIS — R6520 Severe sepsis without septic shock: Secondary | ICD-10-CM

## 2020-10-05 DIAGNOSIS — E0822 Diabetes mellitus due to underlying condition with diabetic chronic kidney disease: Secondary | ICD-10-CM | POA: Diagnosis not present

## 2020-10-05 DIAGNOSIS — J189 Pneumonia, unspecified organism: Secondary | ICD-10-CM | POA: Diagnosis not present

## 2020-10-05 LAB — GLUCOSE, CAPILLARY
Glucose-Capillary: 136 mg/dL — ABNORMAL HIGH (ref 70–99)
Glucose-Capillary: 207 mg/dL — ABNORMAL HIGH (ref 70–99)

## 2020-10-05 LAB — PROCALCITONIN: Procalcitonin: 1 ng/mL

## 2020-10-05 LAB — HEMOGLOBIN A1C
Hgb A1c MFr Bld: 5.3 % (ref 4.8–5.6)
Mean Plasma Glucose: 105 mg/dL

## 2020-10-05 MED ORDER — DOCUSATE SODIUM 100 MG PO CAPS: 100.0000 mg | ORAL_CAPSULE | Freq: Two times a day (BID) | ORAL | 0 refills | Status: DC

## 2020-10-05 MED ORDER — GUAIFENESIN-DM 100-10 MG/5ML PO SYRP: 10.0000 mL | ORAL_SOLUTION | Freq: Four times a day (QID) | ORAL | 0 refills | Status: AC | PRN

## 2020-10-05 MED ORDER — CEFDINIR 300 MG PO CAPS
300.0000 mg | ORAL_CAPSULE | Freq: Two times a day (BID) | ORAL | Status: DC
  Administered 2020-10-05: 300 mg via ORAL
  Filled 2020-10-05 (×2): qty 1

## 2020-10-05 MED ORDER — PSYLLIUM 58.6 % PO POWD: 1.0000 | Freq: Three times a day (TID) | ORAL | 12 refills | Status: DC

## 2020-10-05 MED ORDER — CEFDINIR 300 MG PO CAPS: 300.0000 mg | ORAL_CAPSULE | Freq: Two times a day (BID) | ORAL | 0 refills | Status: AC

## 2020-10-05 NOTE — Progress Notes (Addendum)
Discharge instructions reviewed with Clara from [facility] no questions at this time. Patient being discharged back to group home.

## 2020-10-05 NOTE — Discharge Summary (Signed)
[place] [facility] [place] NAME: Jason Reed    MR#:  [id number]  DATE OF BIRTH:  [birth year]  DATE OF ADMISSION:  10/03/2020 ADMITTING PHYSICIAN: Collier Bullock, MD  DATE OF DISCHARGE: 10/05/2020  PRIMARY CARE PHYSICIAN: Marguerita Merles, MD    ADMISSION DIAGNOSIS:  Sepsis ([place]) [A41.9] Sepsis without acute organ dysfunction, due to unspecified organism ([place]) [A41.9]  DISCHARGE DIAGNOSIS:  Sepsis due to Pneumonia Severe constipation SECONDARY DIAGNOSIS:   Past Medical History:  Diagnosis Date  . Diabetes ([place])   . HTN (hypertension)   . Hyperlipidemia   . Psychosis ([place])   . Seizure [facility])     HOSPITAL COURSE:   Jason Robinsonis a 60 y.o.malewith medical history significant fordiabetes mellitus, hypertension, seizure disorder who was sent to the emergency room from his primary care provider's office after he was taken there by his caregiver for evaluation of weakness and urinary incontinence. Patient was noted to be febrile at his doctor's office with a T-max of 102, he was tachycardic with heart rate of 125 and hypoxic with respiratory rate of 28.   Sepsis(POA) --Most likely from a pulmonary sourceAspirationPneumonia (ct positive for PNA) --Patient presents for evaluation of a nonproductive cough associated with fever, tachypnea, tachycardia, lactic acidosis and chest x-ray findings suggestive of bronchitis. --Aggressive IV fluid resuscitation --cont IV with Rocephin and Zithromax--change to po abxs -- blood cultures so far neg -- patient seen by speech therapy follow recommendations. 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 --Maintain consistent carbohydrate diet --Glycemic control with sliding scale insulin --resume metformin  History of seizures Patient on seizure precautions Continue Dilantin  Severe constipation -- CT abdomen  showed severe constipation with fecal impaction -- ER physician was able to disimpact stool -- will give daily bowel regimen  Obesity Complicates overall prognosis and care  History of psychosis Continue Seroquel, clonazepam, Remeron trazodone and Abilify patient resides at group home    DVT prophylaxis:Lovenox Code Status:full code Family Communication: spoke with clara yancey--group home staff Disposition Plan:Back to previous home environment Level of care: Progressive Cardiac Status is: Inpatient  D/c to group home today  CONSULTS OBTAINED:    DRUG ALLERGIES:   Allergies  Allergen Reactions  . Lisinopril Other (See Comments) and Anaphylaxis    Unknown Unknown    DISCHARGE MEDICATIONS:   Allergies as of 10/05/2020      Reactions   Lisinopril Other (See Comments), Anaphylaxis   Unknown Unknown      Medication List    STOP taking these medications   potassium chloride SA 20 MEQ tablet Commonly known as: KLOR-CON     TAKE these medications   acetaminophen 325 MG tablet Commonly known as: TYLENOL Take 650 mg by mouth every 6 (six) hours as needed.   ARIPiprazole 15 MG tablet Commonly known as: ABILIFY Take 15 mg by mouth daily.   aspirin EC 81 MG tablet Take 81 mg by mouth daily.   cefdinir 300 MG capsule Commonly known as: OMNICEF Take 1 capsule (300 mg total) by mouth every 12 (twelve) hours for 5 days.   cholecalciferol 25 MCG (1000 UNIT) tablet Commonly known as: VITAMIN D Take 1,000 Units by mouth daily.   clonazePAM 1 MG tablet Commonly known as: KLONOPIN Take 1 mg by mouth at bedtime.   docusate sodium 100 MG capsule Commonly known as: COLACE Take 1 capsule (100 mg total) by mouth 2 (  two) times daily.   gabapentin 300 MG capsule Commonly known as: NEURONTIN Take 300 mg by mouth 3 (three) times daily.   guaiFENesin-dextromethorphan 100-10 MG/5ML syrup Commonly known as: ROBITUSSIN DM Take 10 mLs by mouth every 6 (six)  hours as needed for cough.   hydrocortisone cream 1 % Apply 1 application topically 2 (two) times daily as needed.   loratadine 10 MG tablet Commonly known as: CLARITIN Take 10 mg by mouth daily.   losartan 25 MG tablet Commonly known as: COZAAR Take 25 mg by mouth daily.   metFORMIN 500 MG tablet Commonly known as: GLUCOPHAGE Take 500 mg by mouth 2 (two) times daily with a meal.   mirtazapine 15 MG tablet Commonly known as: REMERON Take 15 mg by mouth at bedtime.   phenytoin 100 MG ER capsule Commonly known as: DILANTIN Take 100-300 mg by mouth 2 (two) times daily. Take 100 mg by mouth in the morning and 300 mg by mouth at bedtime.   polyethylene glycol 17 g packet Commonly known as: MIRALAX / GLYCOLAX Take 17 g by mouth 2 (two) times daily.   psyllium 58.6 % powder Commonly known as: METAMUCIL Take 1 packet by mouth 3 (three) times daily. What changed:   when to take this  reasons to take this   QUEtiapine 400 MG tablet Commonly known as: SEROQUEL Take 800 mg by mouth at bedtime.   simvastatin 20 MG tablet Commonly known as: ZOCOR Take 20 mg by mouth at bedtime.   tamsulosin 0.4 MG Caps capsule Commonly known as: FLOMAX TAKE 1 CAPSULE BY MOUTH ONCE DAILY   traZODone 100 MG tablet Commonly known as: DESYREL Take 100 mg by mouth at bedtime.       If you experience worsening of your admission symptoms, develop shortness of breath, life threatening emergency, suicidal or homicidal thoughts you must seek medical attention immediately by calling 911 or calling your MD immediately  if symptoms less severe.  You Must read complete instructions/literature along with all the possible adverse reactions/side effects for all the Medicines you take and that have been prescribed to you. Take any new Medicines after you have completely understood and accept all the possible adverse reactions/side effects.   Please note  You were cared for by a hospitalist during your  hospital stay. If you have any questions about your discharge medications or the care you received while you were in the hospital after you are discharged, you can call the unit and asked to speak with the hospitalist on call if the hospitalist that took care of you is not available. Once you are discharged, your primary care physician will handle any further medical issues. Please note that NO REFILLS for any discharge medications will be authorized once you are discharged, as it is imperative that you return to your primary care physician (or establish a relationship with a primary care physician if you do not have one) for your aftercare needs so that they can reassess your need for medications and monitor your lab values. Today   SUBJECTIVE   Doing well. No new issues per RN Eating well No resp distress  VITAL SIGNS:  Blood pressure 140/90, pulse 82, temperature 98.8 F (37.1 C), temperature source Oral, resp. rate 18, height 6\' 3"  (1.905 m), weight 105.6 kg, SpO2 100 %.  I/O:    Intake/Output Summary (Last 24 hours) at 10/05/2020 0953 Last data filed at 10/05/2020 0950 Gross per 24 hour  Intake 600 ml  Output 3100  ml  Net -2500 ml    PHYSICAL EXAMINATION:  GENERAL:  60 y.o.-year-old patient lying in the bed with no acute distress.  LUNGS: Normal breath sounds bilaterally, no wheezing, rales,rhonchi or crepitation. No use of accessory muscles of respiration.  CARDIOVASCULAR: S1, S2 normal. No murmurs, rubs, or gallops.  ABDOMEN: Soft, non-tender, non-distended. Bowel sounds present. No organomegaly or mass.  EXTREMITIES: No pedal edema, cyanosis, or clubbing.  NEUROLOGIC: non focal PSYCHIATRIC: The patient is alert and awake  SKIN: No obvious rash, lesion, or ulcer.   DATA REVIEW:   CBC  Recent Labs  Lab 10/04/20 0521  WBC 5.0  HGB 9.7*  HCT 30.6*  PLT 109*    Chemistries  Recent Labs  Lab 10/03/20 1205 10/04/20 0521  NA 134* 138  K 4.4 4.0  CL 99 104  CO2 25 26   GLUCOSE 215* 120*  BUN 28* 25*  CREATININE 1.72* 1.75*  CALCIUM 8.7* 8.1*  AST 46*  --   ALT 38  --   ALKPHOS 88  --   BILITOT 0.7  --     Microbiology Results   Recent Results (from the past 240 hour(s))  Resp Panel by RT-PCR (Flu A&B, Covid) Nasopharyngeal Swab     Status: None   Collection Time: 10/03/20 11:37 AM   Specimen: Nasopharyngeal Swab; Nasopharyngeal(NP) swabs in vial transport medium  Result Value Ref Range Status   SARS Coronavirus 2 by RT PCR NEGATIVE NEGATIVE Final    Comment: (NOTE) SARS-CoV-2 target nucleic acids are NOT DETECTED.  The SARS-CoV-2 RNA is generally detectable in upper respiratory specimens during the acute phase of infection. The lowest concentration of SARS-CoV-2 viral copies this assay can detect is 138 copies/mL. A negative result does not preclude SARS-Cov-2 infection and should not be used as the sole basis for treatment or other patient management decisions. A negative result may occur with  improper specimen collection/handling, submission of specimen other than nasopharyngeal swab, presence of viral mutation(s) within the areas targeted by this assay, and inadequate number of viral copies(<138 copies/mL). A negative result must be combined with clinical observations, patient history, and epidemiological information. The expected result is Negative.  Fact Sheet for Patients:  EntrepreneurPulse.com.au  Fact Sheet for Healthcare Providers:  IncredibleEmployment.be  This test is no t yet approved or cleared by the [place] FDA and  has been authorized for detection and/or diagnosis of SARS-CoV-2 by FDA under an Emergency Use Authorization (EUA). This EUA will remain  in effect (meaning this test can be used) for the duration of the COVID-19 declaration under Section 564(b)(1) of the Act, 21 U.S.C.section 360bbb-3(b)(1), unless the authorization is terminated  or revoked sooner.        Influenza A by PCR NEGATIVE NEGATIVE Final   Influenza B by PCR NEGATIVE NEGATIVE Final    Comment: (NOTE) The Xpert Xpress SARS-CoV-2/FLU/RSV plus assay is intended as an aid in the diagnosis of influenza from Nasopharyngeal swab specimens and should not be used as a sole basis for treatment. Nasal washings and aspirates are unacceptable for Xpert Xpress SARS-CoV-2/FLU/RSV testing.  Fact Sheet for Patients: EntrepreneurPulse.com.au  Fact Sheet for Healthcare Providers: IncredibleEmployment.be  This test is not yet approved or cleared by the [place] FDA and has been authorized for detection and/or diagnosis of SARS-CoV-2 by FDA under an Emergency Use Authorization (EUA). This EUA will remain in effect (meaning this test can be used) for the duration of the COVID-19 declaration under Section 564(b)(1) of the Act,  21 U.S.C. section 360bbb-3(b)(1), unless the authorization is terminated or revoked.  Performed at [facility], [address] [place], [place] [address]   Blood Culture (routine x 2)     Status: None (Preliminary result)   Collection Time: 10/03/20 12:05 PM   Specimen: BLOOD LEFT ARM  Result Value Ref Range Status   Specimen Description BLOOD LEFT ARM  Final   Special Requests   Final    BOTTLES DRAWN AEROBIC AND ANAEROBIC Blood Culture results may not be optimal due to an inadequate volume of blood received in culture bottles   Culture   Final    NO GROWTH 2 DAYS Performed at [facility], [address], [place], [place] [address]    Report Status PENDING  Incomplete  Blood Culture (routine x 2)     Status: None (Preliminary result)   Collection Time: 10/03/20 12:05 PM   Specimen: BLOOD  Result Value Ref Range Status   Specimen Description BLOOD RIGHT WRIST  Final   Special Requests   Final    BOTTLES DRAWN AEROBIC AND ANAEROBIC Blood Culture adequate volume   Culture   Final    NO GROWTH 2  DAYS Performed at [facility], [address] [place], [place] [address]    Report Status PENDING  Incomplete  Urine culture     Status: Abnormal   Collection Time: 10/03/20 12:45 PM   Specimen: In/Out Cath Urine  Result Value Ref Range Status   Specimen Description   Final    IN/OUT CATH URINE Performed at [facility], [address], [place], [place] [address]    Special Requests   Final    NONE Performed at [facility], [address], [place], [place] [address]    Culture (A)  Final    <10,000 COLONIES/mL INSIGNIFICANT GROWTH Performed at [facility], [place] [address], [place], [place] [address]    Report Status 10/04/2020 FINAL  Final    RADIOLOGY:  CT Abdomen Pelvis Wo Contrast  Result Date: 10/03/2020 CLINICAL DATA:  Weakness, urinary incontinence EXAM: CT ABDOMEN AND PELVIS WITHOUT CONTRAST TECHNIQUE: Multidetector CT imaging of the abdomen and pelvis was performed following the standard protocol without IV contrast. COMPARISON:  Ultrasound 07/14/2019, CT 07/12/2019 FINDINGS: Lower chest: Bibasilar atelectasis. Heart size is normal. Extensive coronary artery calcification. Hepatobiliary: Stable subcentimeter low-density lesion within the right hepatic dome, likely cyst. No hyperdense gallstone. No biliary dilatation. Pancreas: Grossly unremarkable. Spleen: Normal in size without focal abnormality. Adrenals/Urinary Tract: Unremarkable adrenal glands. 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 No renal or ureteral calculus is identified. Urinary bladder is within normal limits. Stomach/Bowel: Stomach is unremarkable. No dilated loops of small bowel. Large volume of stool throughout the colon including rectal stool ball measuring 7.5 x 7.5 cm in diameter with associated mild rectal wall thickening. No pneumatosis. Vascular/Lymphatic: No significant  vascular findings are evident on noncontrast exam. No enlarged abdominal or pelvic lymph nodes. Reproductive: Mildly prominent prostate gland, similar to prior. Other: No free fluid. No abdominopelvic fluid collection. No pneumoperitoneum. Small fat containing umbilical hernia. Musculoskeletal: Unchanged retrolisthesis of L5 on S1. Multilevel degenerative changes of the thoracic and lumbar spine. No new or acute osseous findings. IMPRESSION: 1. Large volume of stool throughout the colon including large rectal stool ball with associated mild rectal wall thickening. Findings are suggestive of constipation and possible fecal impaction. 2. Mild-moderate left hydroureteronephrosis, similar in  appearance to prior study. No renal or ureteral calculus is identified. A component of mechanical obstruction related to rectosigmoid stool burden is a consideration. 3. Coronary artery atherosclerosis. Electronically Signed   By: Davina Poke D.O.   On: 10/03/2020 14:41   CT CHEST WO CONTRAST  Result Date: 10/03/2020 CLINICAL DATA:  Weakness EXAM: CT CHEST WITHOUT CONTRAST TECHNIQUE: Multidetector CT imaging of the chest was performed following the standard protocol without IV contrast. COMPARISON:  CT 07/12/2019 and previous FINDINGS: Cardiovascular: Aberrant retroesophageal right subclavian artery, an anatomic variant. Extensive coronary calcifications. Heart size normal. No pericardial effusion. Mediastinum/Nodes: No mass or adenopathy. Lungs/Pleura: No pleural effusion. No pneumothorax. Poorly marginated airspace and ground-glass opacities in the apical and posterior segments right upper lobe, and in the anterior basal segment left lower lobe. Musculoskeletal: Anterior vertebral endplate spurring at multiple levels in the mid and lower thoracic spine. IMPRESSION: 1. Ill-defined right upper lobe and left lower lobe pulmonary opacities, suspect infectious/inflammatory etiology. 2. Coronary calcifications. Electronically  Signed   By: Lucrezia Europe M.D.   On: 10/03/2020 16:11   DG Chest Port 1 View  Result Date: 10/03/2020 CLINICAL DATA:  Question sepsis, diabetes mellitus, hypertension EXAM: PORTABLE CHEST 1 VIEW COMPARISON:  Portable exam 1155 hours compared to 07/12/2019 FINDINGS: Upper normal heart size. Mediastinal contours and pulmonary vascularity normal. Peribronchial thickening with mild RIGHT basilar atelectasis unchanged. Remaining lungs clear. No definite pulmonary infiltrate, pleural effusion, or pneumothorax. Scattered endplate spur formation thoracic spine. IMPRESSION: Bronchitic changes with persistent RIGHT basilar atelectasis. Electronically Signed   By: Lavonia Dana M.D.   On: 10/03/2020 12:17     CODE STATUS:     Code Status Orders  (From admission, onward)         Start     Ordered   10/03/20 1510  Full code  Continuous        10/03/20 1510        Code Status History    Date Active Date Inactive Code Status Order ID Comments User Context   07/12/2019 2216 07/17/2019 2114 Full Code [id number]  Sidney Ace Arvella Merles, MD ED   04/09/2019 1800 04/11/2019 1927 Full Code [id number]  Edwin Dada, MD ED   03/06/2019 2324 03/15/2019 1626 Full Code [id number]  Annita Brod, MD ED   01/08/2017 2010 01/12/2017 2137 Full Code [id number]  Bettey Costa, MD Inpatient   Advance Care Planning Activity       TOTAL TIME TAKING CARE OF THIS PATIENT: *35* minutes.    Fritzi Mandes M.D  [facility]    CC: Primary care physician; Marguerita Merles, MD

## 2020-10-05 NOTE — TOC Transition Note (Signed)
Transition of Care [facility]) - CM/SW Discharge Note   Patient Details  Name: Jason Reed MRN: [id number] Date of Birth: Sep 02, 1960  [facility] CM/SW Contact:  Izola Price, RN Phone Number: 10/05/2020, 10:20 AM   Clinical Narrative: Patient ready for discharge today back to group home. [facility] [address] [place] [address]. Contact person is Clara at [phone]. Confirmed discharge with Clara and they will pick patient up for transport back to group home. [facility] for RN/PT accepted on 10/04/20, provider putting orders in. No DME orders. Clara from group home requested DC Summary to go in AVS packet for transport pick up. Relayed this information to Unit RN as well. Simmie Davies RN CM [phone].     Final next level of care: [place] ([place] [phone] Ask for [place].) Barriers to Discharge: Barriers Resolved   Patient Goals and CMS Choice        Discharge Placement                       Discharge Plan and Services                  DME Agency: NA       HH Arranged: RN,PT [place] Agency: [place] ([place]) Date [place]: 10/04/20   Representative spoke with at [place]: Prior CM spoke with Floydene Flock [facility] [phone] which accepted on 10/04/20  Social Determinants of Health (SDOH) Interventions     Readmission Risk Interventions No flowsheet data found.

## 2020-10-08 LAB — CULTURE, BLOOD (ROUTINE X 2)
Culture: NO GROWTH
Culture: NO GROWTH
Special Requests: ADEQUATE

## 2020-10-28 ENCOUNTER — Encounter: Payer: Self-pay | Admitting: Cardiology

## 2020-10-28 ENCOUNTER — Other Ambulatory Visit: Payer: Self-pay

## 2020-10-28 ENCOUNTER — Ambulatory Visit (INDEPENDENT_AMBULATORY_CARE_PROVIDER_SITE_OTHER): Payer: Medicare Other | Admitting: Cardiology

## 2020-10-28 VITALS — BP 124/76 | HR 91 | Ht 72.0 in | Wt 229.0 lb

## 2020-10-28 DIAGNOSIS — I10 Essential (primary) hypertension: Secondary | ICD-10-CM

## 2020-10-28 DIAGNOSIS — I2510 Atherosclerotic heart disease of native coronary artery without angina pectoris: Secondary | ICD-10-CM | POA: Diagnosis not present

## 2020-10-28 DIAGNOSIS — E7800 Pure hypercholesterolemia, unspecified: Secondary | ICD-10-CM

## 2020-10-28 NOTE — Patient Instructions (Signed)

## 2020-10-28 NOTE — Progress Notes (Signed)
Cardiology Office Note:    Date:  10/28/2020   ID:  Jason Reed, DOB 1960-12-27, MRN [id number]  PCP:  Marguerita Merles, MD   [facility] Providers Cardiologist:  Kate Sable, MD     Referring MD: Bunnie Pion, FNP   No chief complaint on file.   History of Present Illness:    Jason Reed is a 60 y.o. male with a hx of hypertension, hyperlipidemia, diabetes, psychosis who presents due to coronary artery calcifications.  He resides at a group home.  Presents today with nursing aide from group home/facility.  He was recently admitted to the hospital 10/03/2020 for 2 days due to weakness, urinary incontinence, diagnosed with pneumonia after chest CT showed right upper and left lower lobe opacities.  He was treated with antibiotics and eventually discharged.  Chest CT also showed extensive coronary calcifications.  He denies chest pain, shortness of breath, edema.  Denies smoking.  Denies any history of heart disease.  Past Medical History:  Diagnosis Date   Diabetes ([place])    HTN (hypertension)    Hyperlipidemia    Psychosis ([place])    Seizure ([place])     Past Surgical History:  Procedure Laterality Date   COLONOSCOPY WITH PROPOFOL N/A 12/22/2018   Procedure: COLONOSCOPY WITH PROPOFOL;  Surgeon: Virgel Manifold, MD;  Location: [facility] ENDOSCOPY;  Service: Endoscopy;  Laterality: N/A;   COLONOSCOPY WITH PROPOFOL N/A 12/23/2018   Procedure: COLONOSCOPY WITH PROPOFOL;  Surgeon: Virgel Manifold, MD;  Location: [facility] ENDOSCOPY;  Service: Endoscopy;  Laterality: N/A;   ESOPHAGOGASTRODUODENOSCOPY (EGD) WITH PROPOFOL N/A 12/22/2018   Procedure: ESOPHAGOGASTRODUODENOSCOPY (EGD) WITH PROPOFOL;  Surgeon: Virgel Manifold, MD;  Location: [facility] ENDOSCOPY;  Service: Endoscopy;  Laterality: N/A;    Current Medications: Current Meds  Medication Sig   acetaminophen (TYLENOL) 325 MG tablet Take 650 mg by mouth every 6 (six) hours as needed.   ARIPiprazole (ABILIFY) 15 MG  tablet Take 15 mg by mouth daily.   aspirin EC 81 MG tablet Take 81 mg by mouth daily.   cholecalciferol (VITAMIN D) 25 MCG (1000 UNIT) tablet Take 1,000 Units by mouth daily.   clonazePAM (KLONOPIN) 1 MG tablet Take 1 mg by mouth at bedtime.   docusate sodium (COLACE) 100 MG capsule Take 1 capsule (100 mg total) by mouth 2 (two) times daily.   gabapentin (NEURONTIN) 300 MG capsule Take 300 mg by mouth 3 (three) times daily.    guaiFENesin-dextromethorphan (ROBITUSSIN DM) 100-10 MG/5ML syrup Take 10 mLs by mouth every 6 (six) hours as needed for cough.   hydrocortisone cream 1 % Apply 1 application topically 2 (two) times daily as needed.    loratadine (CLARITIN) 10 MG tablet Take 10 mg by mouth daily.   losartan (COZAAR) 25 MG tablet Take 25 mg by mouth daily.   metFORMIN (GLUCOPHAGE) 500 MG tablet Take 500 mg by mouth 2 (two) times daily with a meal.   mirtazapine (REMERON) 15 MG tablet Take 15 mg by mouth at bedtime.   phenytoin (DILANTIN) 100 MG ER capsule Take 100-300 mg by mouth 2 (two) times daily. Take 100 mg by mouth in the morning and 300 mg by mouth at bedtime.   polyethylene glycol (MIRALAX / GLYCOLAX) 17 g packet Take 17 g by mouth 2 (two) times daily.   psyllium (METAMUCIL) 58.6 % powder Take 1 packet by mouth 3 (three) times daily.   QUEtiapine (SEROQUEL) 400 MG tablet Take 800 mg by mouth at bedtime.   simvastatin (  ZOCOR) 20 MG tablet Take 20 mg by mouth at bedtime.   tamsulosin (FLOMAX) 0.4 MG CAPS capsule TAKE 1 CAPSULE BY MOUTH ONCE DAILY   traZODone (DESYREL) 100 MG tablet Take 100 mg by mouth at bedtime.      Allergies:   Lisinopril   Social History   Socioeconomic History   Marital status: Single    Spouse name: Not on file   Number of children: Not on file   Years of education: Not on file   Highest education level: Not on file  Occupational History   Not on file  Tobacco Use   Smoking status: Never   Smokeless tobacco: Never  Vaping Use   Vaping Use: Never  used  Substance and Sexual Activity   Alcohol use: No   Drug use: Not Currently   Sexual activity: Not Currently  Other Topics Concern   Not on file  Social History Narrative   Not on file   Social Determinants of Health   Financial Resource Strain: Not on file  Food Insecurity: Not on file  Transportation Needs: Not on file  Physical Activity: Not on file  Stress: Not on file  Social Connections: Not on file     Family History: The patient's Family history is unknown by patient.  ROS:   Please see the history of present illness.     All other systems reviewed and are negative.  EKGs/Labs/Other Studies Reviewed:    The following studies were reviewed today:   EKG:  EKG is  ordered today.  The ekg ordered today demonstrates normal sinus rhythm, incomplete right bundle branch block  Recent Labs: 10/03/2020: ALT 38 10/04/2020: BUN 25; Creatinine, Ser 1.75; Hemoglobin 9.7; Platelets 109; Potassium 4.0; Sodium 138  Recent Lipid Panel    Component Value Date/Time   CHOL 146 04/10/2019 0648   TRIG 138 04/10/2019 0648   HDL 49 04/10/2019 0648   CHOLHDL 3.0 04/10/2019 0648   VLDL 28 04/10/2019 0648   LDLCALC 69 04/10/2019 0648     Risk Assessment/Calculations:          Physical Exam:    VS:  BP 124/76 (BP Location: Left Arm, Patient Position: Sitting, Cuff Size: Large)   Pulse 91   Ht 6' (1.829 m)   Wt 229 lb (103.9 kg)   SpO2 93%   BMI 31.06 kg/m     Wt Readings from Last 3 Encounters:  10/28/20 229 lb (103.9 kg)  10/05/20 232 lb 12.9 oz (105.6 kg)  06/11/20 227 lb (103 kg)     GEN:  Well nourished, well developed in no acute distress, appears somnolent HEENT: Normal NECK: No JVD; No carotid bruits LYMPHATICS: No lymphadenopathy CARDIAC: RRR, no murmurs, rubs, gallops RESPIRATORY: Poor inspiratory effort, otherwise clear ABDOMEN: Soft, non-tender, non-distended MUSCULOSKELETAL:  No edema; No deformity  SKIN: Warm and dry NEUROLOGIC:  Alert and  oriented to person PSYCHIATRIC:  Normal affect   ASSESSMENT:    1. Coronary artery disease involving native coronary artery of native heart without angina pectoris   2. Primary hypertension   3. Pure hypercholesterolemia    PLAN:    In order of problems listed above:  CAD/coronary calcifications on chest CT.  Denies chest pain.  Last LDL controlled.  Continue aspirin 81 mg, simvastatin as prescribed.  Get echocardiogram to evaluate systolic function, wall motion abnormalities. Hypertension, BP controlled.  Continue losartan. Hyperlipidemia, cholesterol controlled.  Continue simvastatin.  Follow-up after echocardiogram.     Medication Adjustments/Labs and  Tests Ordered: Current medicines are reviewed at length with the patient today.  Concerns regarding medicines are outlined above.  Orders Placed This Encounter  Procedures   EKG 12-Lead   ECHOCARDIOGRAM COMPLETE    No orders of the defined types were placed in this encounter.   Patient Instructions  Medication Instructions:   Your physician recommends that you continue on your current medications as directed. Please refer to the Current Medication list given to you today.  *If you need a refill on your cardiac medications before your next appointment, please call your pharmacy*   Lab Work: None ordered If you have labs (blood work) drawn today and your tests are completely normal, you will receive your results only by: [place] (if you have MyChart) OR A paper copy in the mail If you have any lab test that is abnormal or we need to change your treatment, we will call you to review the results.   Testing/Procedures:   Your physician has requested that you have an echocardiogram. Echocardiography is a painless test that uses sound waves to create images of your heart. It provides your doctor with information about the size and shape of your heart and how well your heart's chambers and valves are working. This  procedure takes approximately one hour. There are no restrictions for this procedure.    Follow-Up: At [facility], you and your health needs are our priority.  As part of our continuing mission to provide you with exceptional heart care, we have created designated Provider Care Teams.  These Care Teams include your primary Cardiologist (physician) and Advanced Practice Providers (APPs -  Physician Assistants and Nurse Practitioners) who all work together to provide you with the care you need, when you need it.  We recommend signing up for the patient portal called "MyChart".  Sign up information is provided on this After Visit Summary.  MyChart is used to connect with patients for Virtual Visits (Telemedicine).  Patients are able to view lab/test results, encounter notes, upcoming appointments, etc.  Non-urgent messages can be sent to your provider as well.   To learn more about what you can do with MyChart, go to [email or web address].    Your next appointment:   Follow up after Echo   The format for your next appointment:   In Person  Provider:   Kate Sable, MD   Other Instructions    Signed, Kate Sable, MD  10/28/2020 3:48 PM    [employer]

## 2020-12-20 ENCOUNTER — Other Ambulatory Visit: Payer: Medicare Other

## 2020-12-24 ENCOUNTER — Ambulatory Visit: Payer: Medicare Other | Admitting: Cardiology

## 2021-02-04 ENCOUNTER — Other Ambulatory Visit: Payer: Medicare Other

## 2021-02-10 ENCOUNTER — Ambulatory Visit: Payer: Medicare Other | Admitting: Cardiology

## 2021-04-25 IMAGING — US US CAROTID DUPLEX BILAT
1 series · 13 of 24 positions shown · non-contrast
Comparison: None.

CLINICAL DATA: TIA symptoms

EXAM:
BILATERAL CAROTID DUPLEX ULTRASOUND
TECHNIQUE: Gray scale imaging, color Doppler and duplex ultrasound were
performed of bilateral carotid and vertebral arteries in the neck.

[Series 1: us carotid duplex bilat · 13 of 58 slices shown]
[im 1/58]
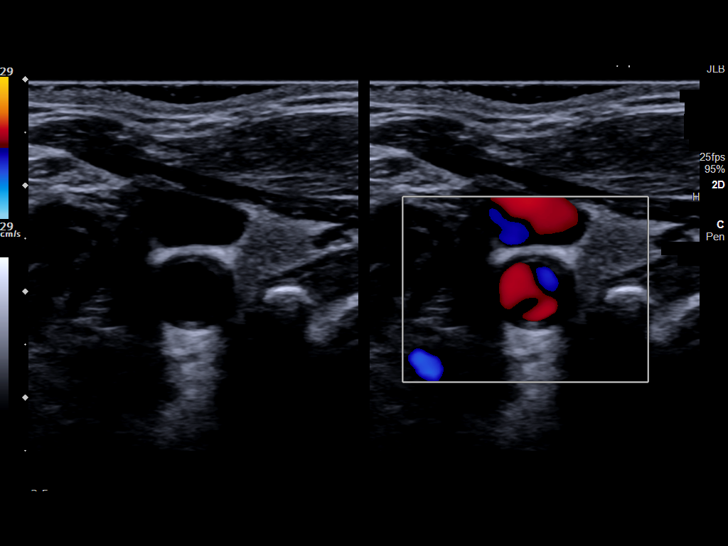
[im 5/58]
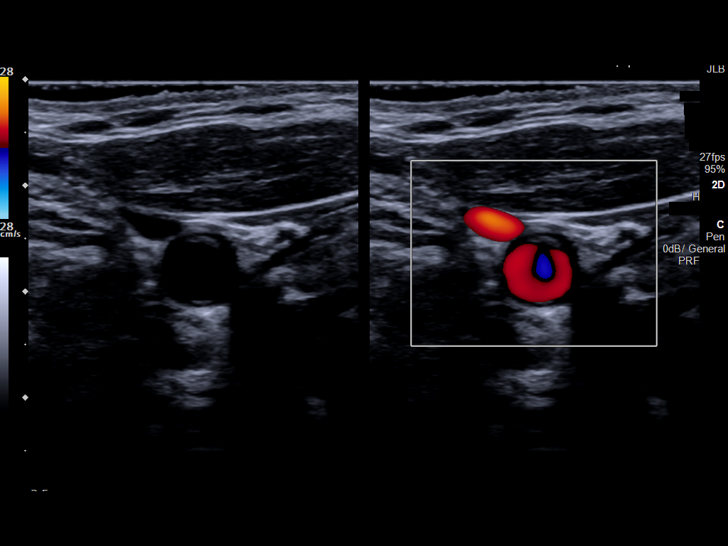
[im 10/58]
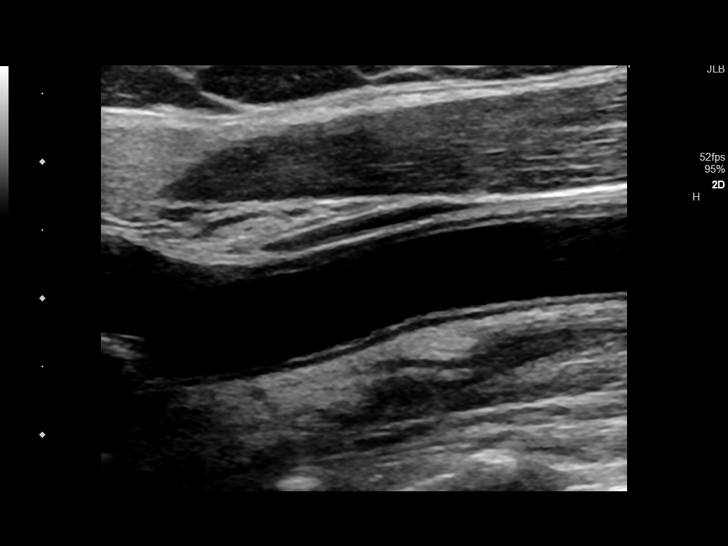
[im 15/58]
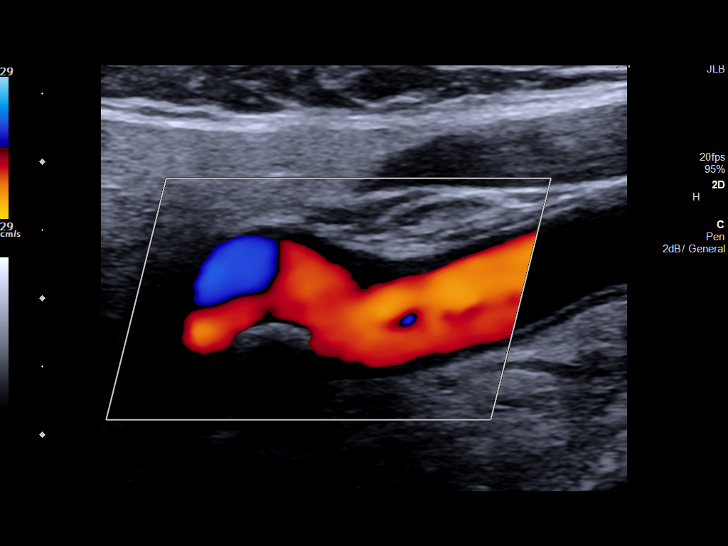
[im 20/58]
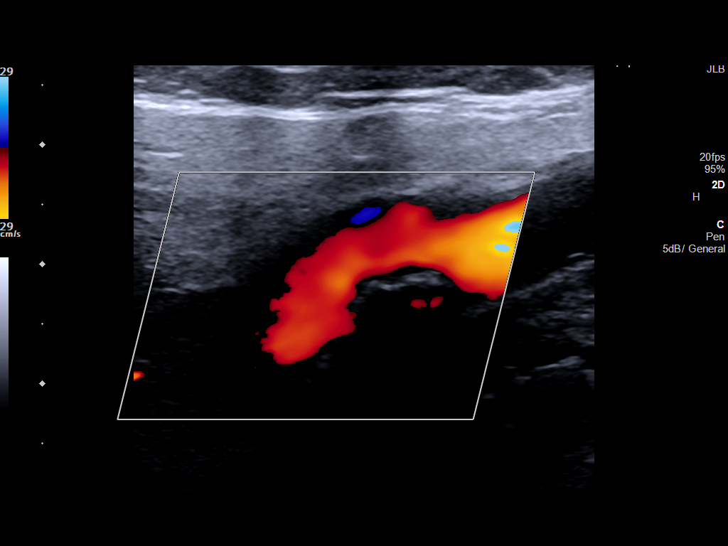
[im 25/58]
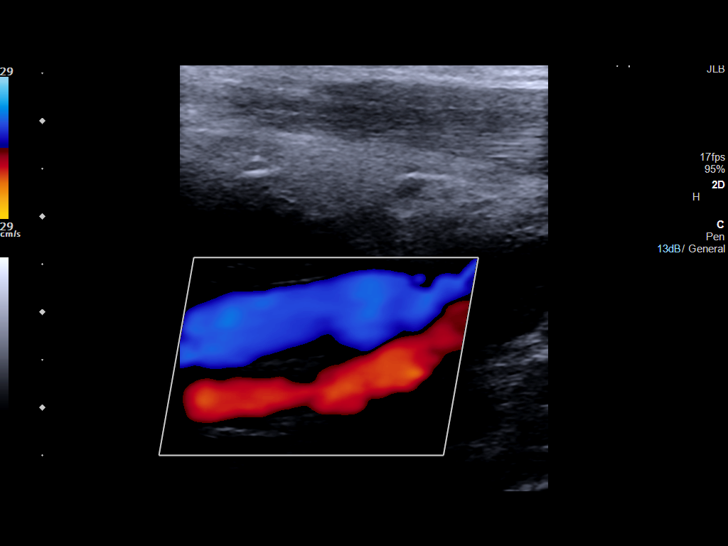
[im 30/58]
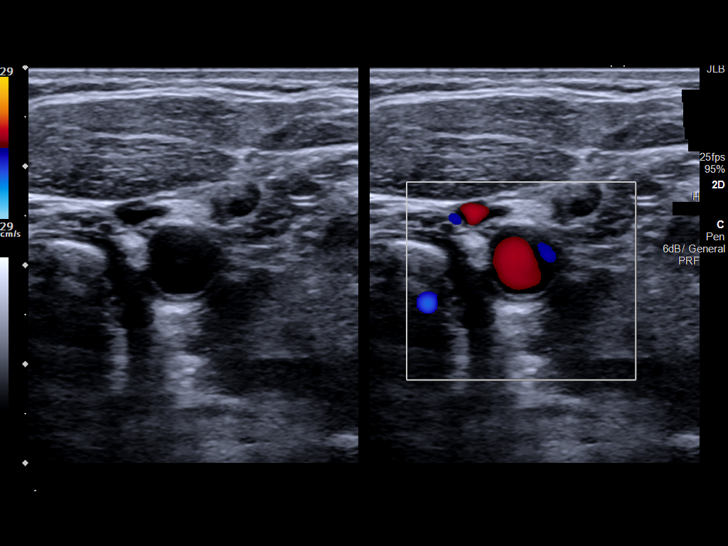
[im 33/58]
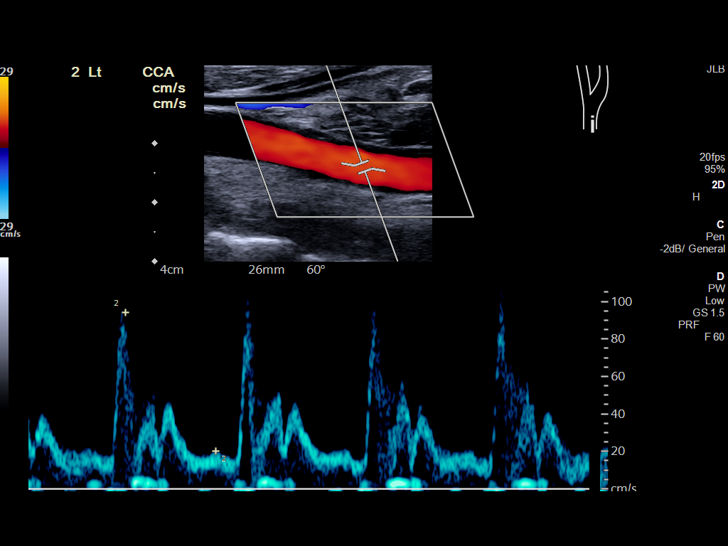
[im 38/58]
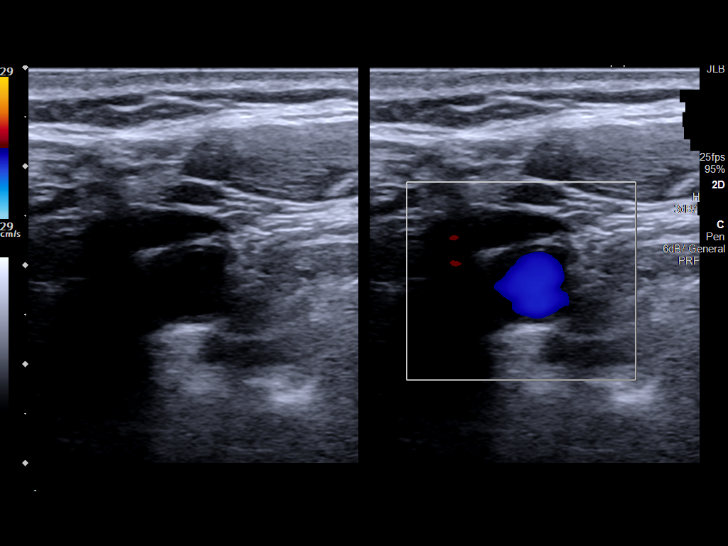
[im 43/58]
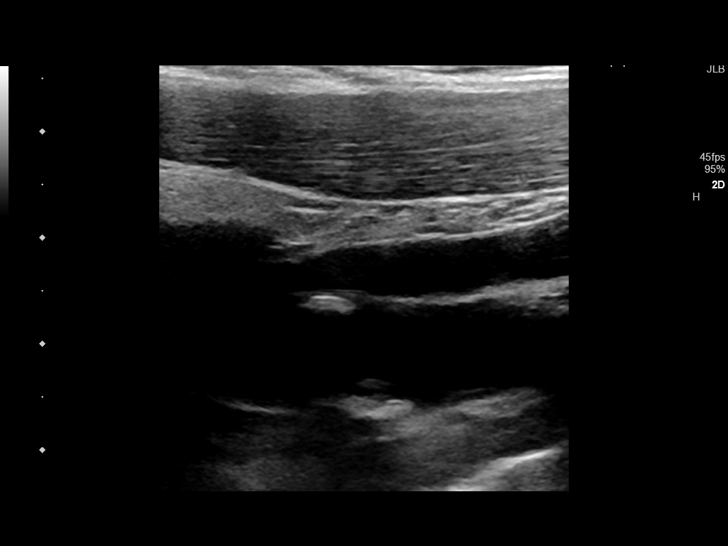
[im 48/58]
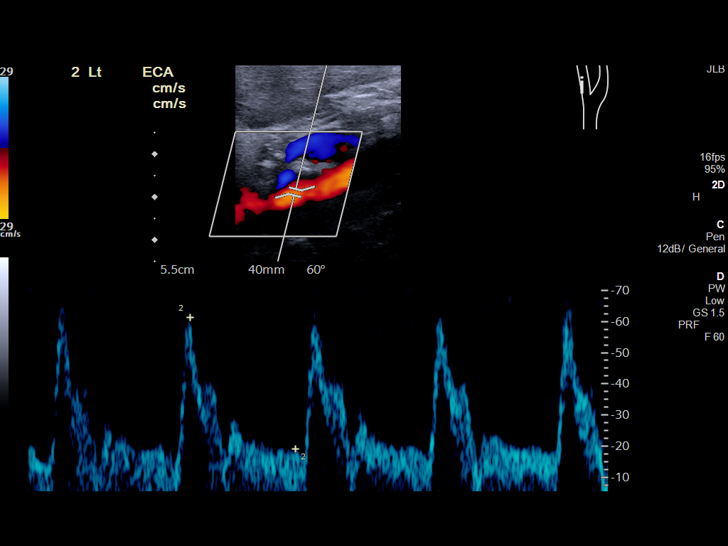
[im 53/58]
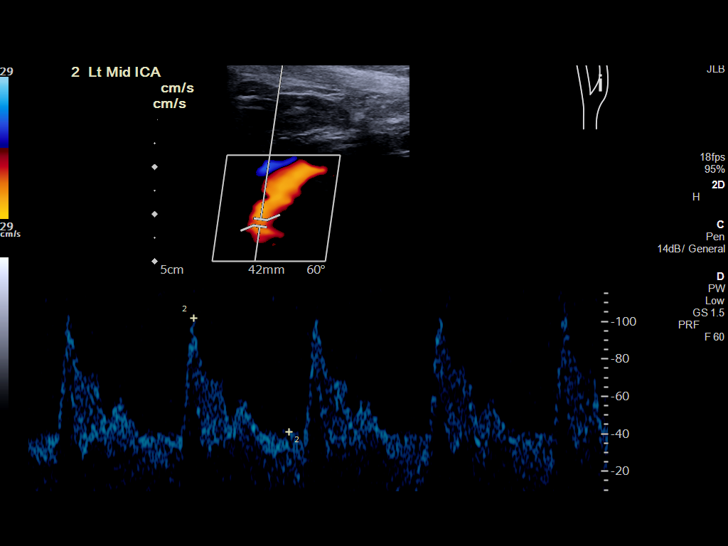
[im 58/58]
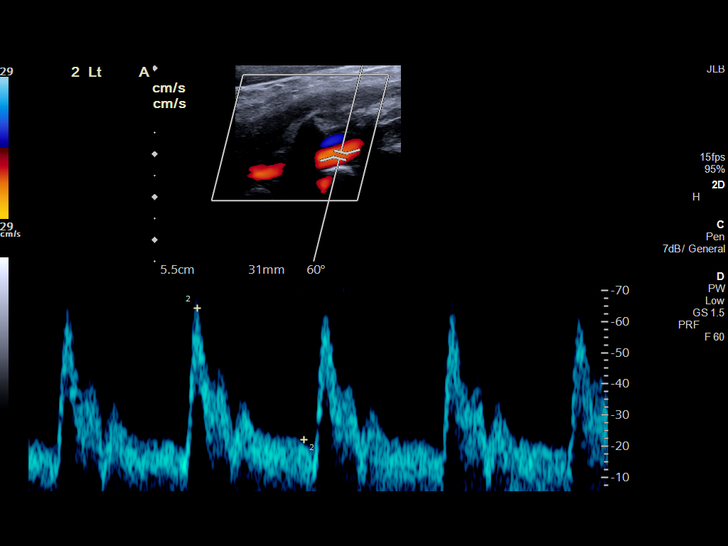

[13 of 24 positions shown; findings below may reference images not displayed]

FINDINGS: Criteria: Quantification of carotid stenosis is based on velocity
parameters that correlate the residual internal carotid diameter
with NASCET-based stenosis levels, using the diameter of the distal
internal carotid lumen as the denominator for stenosis measurement.

The following velocity measurements were obtained:

RIGHT

ICA: 57/14 cm/sec

CCA: 99/22 cm/sec

SYSTOLIC ICA/CCA RATIO:

ECA: 79 cm/sec

LEFT

ICA: 102/41 cm/sec

CCA: 86/21 cm/sec

SYSTOLIC ICA/CCA RATIO:

ECA: 61 cm/sec

RIGHT CAROTID ARTERY: Minor echogenic shadowing plaque formation. No
hemodynamically significant right ICA stenosis, velocity elevation,
or turbulent flow. Degree of narrowing less than 50%.

RIGHT VERTEBRAL ARTERY:  Antegrade

LEFT CAROTID ARTERY: Similar scattered minor echogenic plaque
formation. No hemodynamically significant left ICA stenosis,
velocity elevation, or turbulent flow.

LEFT VERTEBRAL ARTERY:  Antegrade
IMPRESSION: Minor carotid atherosclerosis. No hemodynamically significant ICA
stenosis. Degree of narrowing less than 50% bilaterally by
ultrasound criteria.

Patent antegrade vertebral flow bilaterally

## 2021-05-24 ENCOUNTER — Other Ambulatory Visit: Payer: Self-pay | Admitting: Physician Assistant

## 2021-06-09 ENCOUNTER — Other Ambulatory Visit: Payer: Self-pay

## 2021-06-09 DIAGNOSIS — R972 Elevated prostate specific antigen [PSA]: Secondary | ICD-10-CM

## 2021-06-09 DIAGNOSIS — N1831 Chronic kidney disease, stage 3a: Secondary | ICD-10-CM

## 2021-06-10 NOTE — Progress Notes (Incomplete)
06/10/21 4:06 PM   Jason Reed Jun 29, 1960 [id number]  Referring provider:  Marguerita Reed, Jason Reed,  Jason Reed [id number] No chief complaint on file.    HPI: Jason Reed is a 61 y.o.male with solitary left kidney, chronic left hydroureteronephrosis, BPH with incomplete bladder emptying, and rUTI, who returns today for 6 month follow-up with IPSS, PSA, PVR, DRE, and BMP prior.   Cystoscopy on 09/01/2019 was notable for prostatic enlargement without prostatic coaptation and a fairly open, funnel-shaped prostatic urethra suggestive of a previous prostate procedure.   History has been very challenging, with indeterminate timing and rationale for prior right nephrectomy.   PSA***  BMP***  PMH: Past Medical History:  Diagnosis Date   Diabetes ([place])    HTN (hypertension)    Hyperlipidemia    Psychosis ([place])    Seizure ([place])     Surgical History: Past Surgical History:  Procedure Laterality Date   COLONOSCOPY WITH PROPOFOL N/A 12/22/2018   Procedure: COLONOSCOPY WITH PROPOFOL;  Surgeon: Jason Manifold, MD;  Location: [facility] ENDOSCOPY;  Service: Endoscopy;  Laterality: N/A;   COLONOSCOPY WITH PROPOFOL N/A 12/23/2018   Procedure: COLONOSCOPY WITH PROPOFOL;  Surgeon: Jason Manifold, MD;  Location: [facility] ENDOSCOPY;  Service: Endoscopy;  Laterality: N/A;   ESOPHAGOGASTRODUODENOSCOPY (EGD) WITH PROPOFOL N/A 12/22/2018   Procedure: ESOPHAGOGASTRODUODENOSCOPY (EGD) WITH PROPOFOL;  Surgeon: Jason Manifold, MD;  Location: [facility] ENDOSCOPY;  Service: Endoscopy;  Laterality: N/A;    Home Medications:  Allergies as of 06/11/2021       Reactions   Lisinopril Other (See Comments), Anaphylaxis   Unknown Unknown        Medication List        Accurate as of June 10, 2021  4:06 PM. If you have any questions, ask your nurse or doctor.          acetaminophen 325 MG tablet Commonly known as: TYLENOL Take 650 mg by mouth every 6 (six) hours as  needed.   ARIPiprazole 15 MG tablet Commonly known as: ABILIFY Take 15 mg by mouth daily.   aspirin EC 81 MG tablet Take 81 mg by mouth daily.   cholecalciferol 25 MCG (1000 UNIT) tablet Commonly known as: VITAMIN D Take 1,000 Units by mouth daily.   clonazePAM 1 MG tablet Commonly known as: KLONOPIN Take 1 mg by mouth at bedtime.   docusate sodium 100 MG capsule Commonly known as: COLACE Take 1 capsule (100 mg total) by mouth 2 (two) times daily.   gabapentin 300 MG capsule Commonly known as: NEURONTIN Take 300 mg by mouth 3 (three) times daily.   guaiFENesin-dextromethorphan 100-10 MG/5ML syrup Commonly known as: ROBITUSSIN DM Take 10 mLs by mouth every 6 (six) hours as needed for cough.   hydrocortisone cream 1 % Apply 1 application topically 2 (two) times daily as needed.   loratadine 10 MG tablet Commonly known as: CLARITIN Take 10 mg by mouth daily.   losartan 25 MG tablet Commonly known as: COZAAR Take 25 mg by mouth daily.   metFORMIN 500 MG tablet Commonly known as: GLUCOPHAGE Take 500 mg by mouth 2 (two) times daily with a meal.   mirtazapine 15 MG tablet Commonly known as: REMERON Take 15 mg by mouth at bedtime.   phenytoin 100 MG ER capsule Commonly known as: DILANTIN Take 100-300 mg by mouth 2 (two) times daily. Take 100 mg by mouth in the morning and 300 mg by mouth at bedtime.   polyethylene glycol 17  g packet Commonly known as: MIRALAX / GLYCOLAX Take 17 g by mouth 2 (two) times daily.   psyllium 58.6 % powder Commonly known as: METAMUCIL Take 1 packet by mouth 3 (three) times daily.   QUEtiapine 400 MG tablet Commonly known as: SEROQUEL Take 800 mg by mouth at bedtime.   simvastatin 20 MG tablet Commonly known as: ZOCOR Take 20 mg by mouth at bedtime.   tamsulosin 0.4 MG Caps capsule Commonly known as: FLOMAX Take 1 capsule (0.4 mg total) by mouth daily.   traZODone 100 MG tablet Commonly known as: DESYREL Take 100 mg by mouth  at bedtime.        Allergies:  Allergies  Allergen Reactions   Lisinopril Other (See Comments) and Anaphylaxis    Unknown Unknown    Family History: Family History  Family history unknown: Yes    Social History:  reports that he has never smoked. He has never used smokeless tobacco. He reports that he does not currently use drugs. He reports that he does not drink alcohol.   Physical Exam: There were no vitals taken for this visit.  Constitutional:  Alert and oriented, No acute distress. HEENT: Jason Reed AT, moist mucus membranes.  Trachea midline, no masses. Cardiovascular: No clubbing, cyanosis, or edema. Respiratory: Normal respiratory effort, no increased work of breathing. Rectal: Normal sphincter tone,  ***  CC prostate, smooth no nodules Skin: No rashes, bruises or suspicious lesions. Neurologic: Grossly intact, no focal deficits, moving all 4 extremities. Psychiatric: Normal mood and affect.  Laboratory Data:  Lab Results  Component Value Date   CREATININE 1.75 (H) 10/04/2020   Lab Results  Component Value Date   HGBA1C 5.3 10/03/2020    Urinalysis   Pertinent Imaging:   Assessment & Plan:     No follow-ups on file.  I,Jason Reed,acting as a Education administrator for Jason Espy, MD.,have documented all relevant documentation on the behalf of Jason Espy, MD,as directed by  Jason Espy, MD while in the presence of Jason Reed, [place] [address], [place], [place] [address] [phone]

## 2021-06-11 ENCOUNTER — Encounter: Payer: Self-pay | Admitting: Urology

## 2021-06-11 ENCOUNTER — Ambulatory Visit: Payer: Medicare Other | Admitting: Urology

## 2021-06-23 ENCOUNTER — Other Ambulatory Visit: Payer: Self-pay | Admitting: Urology

## 2021-07-08 NOTE — Progress Notes (Incomplete)
? ?07/08/21 ?7:28 AM  ? ?Jason Reed ?[birth year] ?[id number] ? ?Referring provider:  ?Jason Merles, MD ?[address] ?[place],  [place] [address] ?No chief complaint on file. ? ? ? ?HPI: ?Jason Reed is a 61 y.o.male with a personal history of solitary left kidney with chronic hydroureteronephrosis, recurrent UTI, and incomplete bladder emptying on Flomax , who presents today for annual follow-up with PVR, IPSS, DRE, PSA and BMP.  ? ? ? ? ? ? ? ?PMH: ?Past Medical History:  ?Diagnosis Date  ? Diabetes ([place])   ? HTN (hypertension)   ? Hyperlipidemia   ? Psychosis ([name])   ? Seizure ([place])   ? ? ?Surgical History: ?Past Surgical History:  ?Procedure Laterality Date  ? COLONOSCOPY WITH PROPOFOL N/A 12/22/2018  ? Procedure: COLONOSCOPY WITH PROPOFOL;  Surgeon: Virgel Manifold, MD;  Location: [facility];  Service: Endoscopy;  Laterality: N/A;  ? COLONOSCOPY WITH PROPOFOL N/A 12/23/2018  ? Procedure: COLONOSCOPY WITH PROPOFOL;  Surgeon: Virgel Manifold, MD;  Location: [facility];  Service: Endoscopy;  Laterality: N/A;  ? ESOPHAGOGASTRODUODENOSCOPY (EGD) WITH PROPOFOL N/A 12/22/2018  ? Procedure: ESOPHAGOGASTRODUODENOSCOPY (EGD) WITH PROPOFOL;  Surgeon: Virgel Manifold, MD;  Location: [facility];  Service: Endoscopy;  Laterality: N/A;  ? ? ?Home Medications:  ?Allergies as of 07/09/2021   ? ?   Reactions  ? Lisinopril Other (See Comments), Anaphylaxis  ? Unknown ?Unknown  ? ?  ? ?  ?Medication List  ?  ? ?  ? Accurate as of July 08, 2021  7:28 AM. If you have any questions, ask your nurse or doctor.  ?  ?  ? ?  ? ?acetaminophen 325 MG tablet ?Commonly known as: TYLENOL ?Take 650 mg by mouth every 6 (six) hours as needed. ?  ?ARIPiprazole 15 MG tablet ?Commonly known as: ABILIFY ?Take 15 mg by mouth daily. ?  ?aspirin EC 81 MG tablet ?Take 81 mg by mouth daily. ?  ?cholecalciferol 25 MCG (1000 UNIT) tablet ?Commonly known as: VITAMIN D ?Take 1,000 Units by mouth daily. ?  ?clonazePAM 1 MG  tablet ?Commonly known as: KLONOPIN ?Take 1 mg by mouth at bedtime. ?  ?docusate sodium 100 MG capsule ?Commonly known as: COLACE ?Take 1 capsule (100 mg total) by mouth 2 (two) times daily. ?  ?gabapentin 300 MG capsule ?Commonly known as: NEURONTIN ?Take 300 mg by mouth 3 (three) times daily. ?  ?guaiFENesin-dextromethorphan 100-10 MG/5ML syrup ?Commonly known as: ROBITUSSIN DM ?Take 10 mLs by mouth every 6 (six) hours as needed for cough. ?  ?hydrocortisone cream 1 % ?Apply 1 application topically 2 (two) times daily as needed. ?  ?loratadine 10 MG tablet ?Commonly known as: CLARITIN ?Take 10 mg by mouth daily. ?  ?losartan 25 MG tablet ?Commonly known as: COZAAR ?Take 25 mg by mouth daily. ?  ?metFORMIN 500 MG tablet ?Commonly known as: GLUCOPHAGE ?Take 500 mg by mouth 2 (two) times daily with a meal. ?  ?mirtazapine 15 MG tablet ?Commonly known as: REMERON ?Take 15 mg by mouth at bedtime. ?  ?phenytoin 100 MG ER capsule ?Commonly known as: DILANTIN ?Take 100-300 mg by mouth 2 (two) times daily. Take 100 mg by mouth in the morning and 300 mg by mouth at bedtime. ?  ?polyethylene glycol 17 g packet ?Commonly known as: MIRALAX / GLYCOLAX ?Take 17 g by mouth 2 (two) times daily. ?  ?psyllium 58.6 % powder ?Commonly known as: METAMUCIL ?Take 1 packet by mouth 3 (three) times daily. ?  ?  QUEtiapine 400 MG tablet ?Commonly known as: SEROQUEL ?Take 800 mg by mouth at bedtime. ?  ?simvastatin 20 MG tablet ?Commonly known as: ZOCOR ?Take 20 mg by mouth at bedtime. ?  ?tamsulosin 0.4 MG Caps capsule ?Commonly known as: FLOMAX ?TAKE 1 CAPSULE BY MOUTH ONCE DAILY ?  ?traZODone 100 MG tablet ?Commonly known as: DESYREL ?Take 100 mg by mouth at bedtime. ?  ? ?  ? ? ?Allergies:  ?Allergies  ?Allergen Reactions  ? Lisinopril Other (See Comments) and Anaphylaxis  ?  Unknown ?Unknown  ? ? ?Family History: ?Family History  ?Family history unknown: Yes  ? ? ?Social History:  reports that he has never smoked. He has never used  smokeless tobacco. He reports that he does not currently use drugs. He reports that he does not drink alcohol. ? ? ?Physical Exam: ?There were no vitals taken for this visit.  ?Constitutional:  Alert and oriented, No acute distress. ?HEENT: [place] AT, moist mucus membranes.  Trachea midline, no masses. ?Cardiovascular: No clubbing, cyanosis, or edema. ?Respiratory: Normal respiratory effort, no increased work of breathing. ?Rectal: Normal sphincter tone,  ***  CC prostate, smooth no nodules ?Skin: No rashes, bruises or suspicious lesions. ?Neurologic: Grossly intact, no focal deficits, moving all 4 extremities. ?Psychiatric: Normal mood and affect. ? ?Laboratory Data: ? ?Lab Results  ?Component Value Date  ? CREATININE 1.75 (H) 10/04/2020  ? ? ?Lab Results  ?Component Value Date  ? HGBA1C 5.3 10/03/2020  ? ? ?Urinalysis ? ? ?Pertinent Imaging: ?Pvr*** ? ?Assessment & Plan:   ? ? ?No follow-ups on file. ? ?I,Kailey Littlejohn,acting as a scribe for Hollice Espy, MD.,have documented all relevant documentation on the behalf of Hollice Espy, MD,as directed by  Hollice Espy, MD while in the presence of Hollice Espy, MD. ? ? ? ?M[facility] ?[address], [address] ?[place], [place] [address] ?[phone] ? ?

## 2021-07-09 ENCOUNTER — Ambulatory Visit: Payer: Medicare Other | Admitting: Urology

## 2021-07-21 ENCOUNTER — Other Ambulatory Visit: Payer: Self-pay | Admitting: Urology

## 2021-07-27 IMAGING — DX DG CHEST 1V PORT
1 series · 1 of 1 positions shown · non-contrast
Comparison: 04/09/2019

CLINICAL DATA: Fever, tachycardia and weakness.

EXAM:
PORTABLE CHEST 1 VIEW

[chest ap]
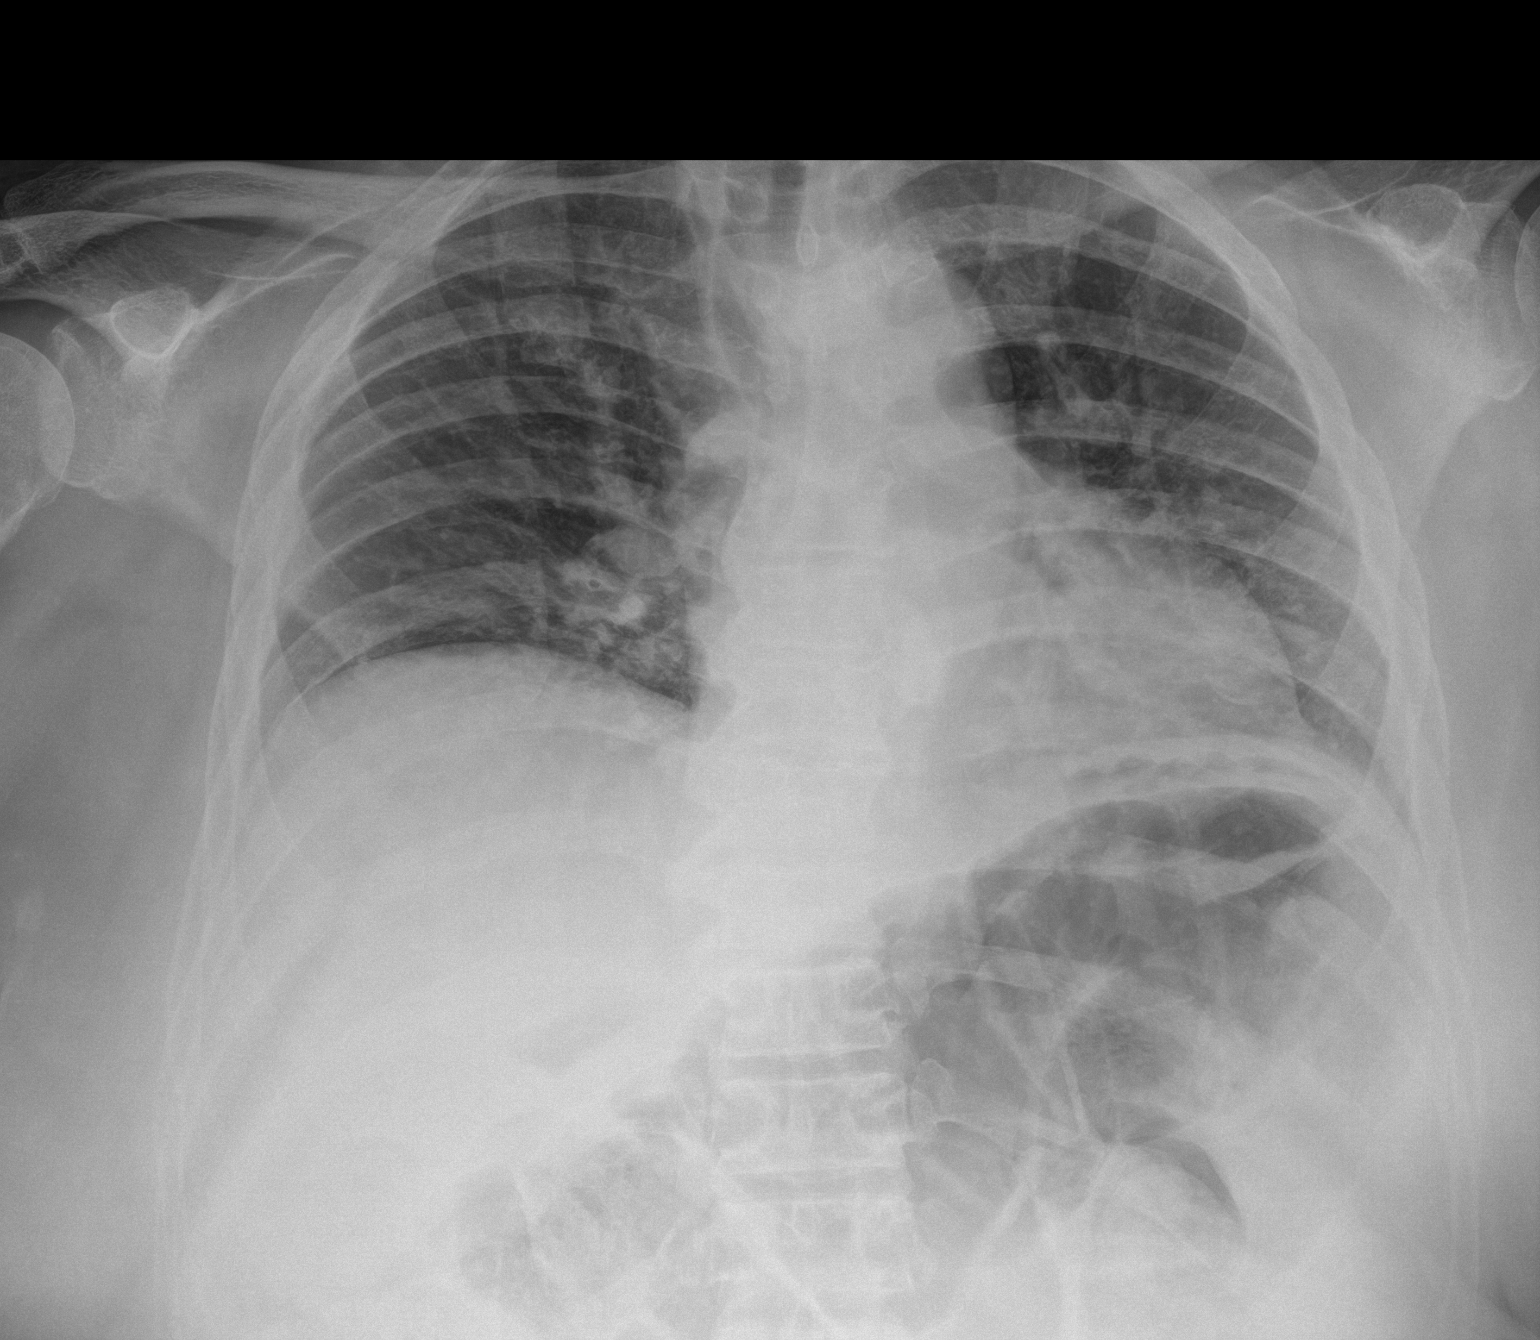

[1 of 1 positions shown; findings below may reference images not displayed]

FINDINGS: Poor inspiration. Borderline cardiomegaly. Aortic tortuosity. Patchy
bilateral lung densities that could relate to the poor inspiration
or could indicate bilateral pneumonia. No dense consolidation, lobar
collapse or effusion.
IMPRESSION: Poor inspiration. Patchy bilateral lung densities that could relate
to poor inspiration or could indicate pneumonia. No dense
consolidation or lobar collapse.

## 2021-08-20 ENCOUNTER — Encounter: Payer: Self-pay | Admitting: Cardiology

## 2021-08-20 NOTE — Progress Notes (Signed)
Unable to contact patient to schedule echocardiogram, letter sent, order cancelled. 

## 2021-09-22 ENCOUNTER — Ambulatory Visit: Payer: Medicare Other | Admitting: Cardiology

## 2021-09-23 ENCOUNTER — Encounter: Payer: Self-pay | Admitting: Cardiology

## 2022-03-19 ENCOUNTER — Ambulatory Visit: Payer: Medicare Other | Admitting: Physician Assistant

## 2022-03-31 ENCOUNTER — Ambulatory Visit (INDEPENDENT_AMBULATORY_CARE_PROVIDER_SITE_OTHER): Payer: Medicare Other | Admitting: Physician Assistant

## 2022-03-31 ENCOUNTER — Encounter: Payer: Self-pay | Admitting: Physician Assistant

## 2022-03-31 VITALS — BP 132/82 | HR 92 | Ht 72.0 in | Wt 234.0 lb

## 2022-03-31 DIAGNOSIS — N401 Enlarged prostate with lower urinary tract symptoms: Secondary | ICD-10-CM | POA: Diagnosis not present

## 2022-03-31 DIAGNOSIS — Z87440 Personal history of urinary (tract) infections: Secondary | ICD-10-CM

## 2022-03-31 DIAGNOSIS — R3914 Feeling of incomplete bladder emptying: Secondary | ICD-10-CM | POA: Diagnosis not present

## 2022-03-31 DIAGNOSIS — N390 Urinary tract infection, site not specified: Secondary | ICD-10-CM

## 2022-03-31 DIAGNOSIS — R972 Elevated prostate specific antigen [PSA]: Secondary | ICD-10-CM | POA: Diagnosis not present

## 2022-03-31 LAB — URINALYSIS, COMPLETE
Bilirubin, UA: NEGATIVE
Ketones, UA: NEGATIVE
Leukocytes,UA: NEGATIVE
Nitrite, UA: NEGATIVE
RBC, UA: NEGATIVE
Specific Gravity, UA: 1.02 (ref 1.005–1.030)
Urobilinogen, Ur: 0.2 mg/dL (ref 0.2–1.0)
pH, UA: 5.5 (ref 5.0–7.5)

## 2022-03-31 LAB — MICROSCOPIC EXAMINATION

## 2022-03-31 LAB — BLADDER SCAN AMB NON-IMAGING: Scan Result: 204

## 2022-03-31 NOTE — Progress Notes (Signed)
03/31/2022 10:12 AM   Jason Reed 10-10-1960 [id number]  CC: Chief Complaint  Patient presents with   Elevated PSA    HPI: Jason Reed is a 61 y.o. male with PMH seizures, psychosis, solitary left kidney with chronic hydroureteronephrosis, recurrent UTI, incomplete bladder emptying, and rising PSA who no showed a prostate MRI last year who has been referred back to our clinic for further evaluation of his rising PSA.  History on this patient remains very challenging, as we have been unable to determine the timing or reason for his prior right nephrectomy.  Additionally, he had cystoscopy findings consistent with a possible prior prostate procedure, but we are unable to find any medical records to support this.  Notably, he has a history of chronic severe constipation and his caregivers report that he is unable to tolerate any sort of bowel prep or enema, which has made further evaluation of his rising PSA extremely challenging.  Today he reports ***  PSA history as below: 11/30/2019 5.0 06/11/2020 6.6 03/02/2022 7.0  In-office UA today positive for 2+ glucose and trace protein; urine microscopy with moderate bacteria. PVR 249m.  PMH: Past Medical History:  Diagnosis Date   Diabetes ([place]    HTN (hypertension)    Hyperlipidemia    Psychosis ([place]    Seizure ([place]     Surgical History: Past Surgical History:  Procedure Laterality Date   COLONOSCOPY WITH PROPOFOL N/A 12/22/2018   Procedure: COLONOSCOPY WITH PROPOFOL;  Surgeon: TVirgel Manifold MD;  Location: [facility] END[facility]COPY;  Service: Endoscopy;  Laterality: N/A;   COLONOSCOPY WITH PROPOFOL N/A 12/23/2018   Procedure: COLONOSCOPY WITH PROPOFOL;  Surgeon: TVirgel Manifold MD;  Location: [facility]COPY;  Service: Endoscopy;  Laterality: N/A;   ESOPHAGOGASTRODUODENOSCOPY (EGD) WITH PROPOFOL N/A 12/22/2018   Procedure: ESOPHAGOGASTRODUODENOSCOPY (EGD) WITH PROPOFOL;  Surgeon: TVirgel Manifold MD;  Location:  [facility] END[facility]COPY;  Service: Endoscopy;  Laterality: N/A;    Home Medications:  Allergies as of 03/31/2022       Reactions   Lisinopril Other (See Comments), Anaphylaxis   Unknown Unknown        Medication List        Accurate as of March 31, 2022 10:12 AM. If you have any questions, ask your nurse or doctor.          acetaminophen 325 MG tablet Commonly known as: TYLENOL Take 650 mg by mouth every 6 (six) hours as needed.   ARIPiprazole 15 MG tablet Commonly known as: ABILIFY Take 15 mg by mouth daily.   aspirin EC 81 MG tablet Take 81 mg by mouth daily.   cholecalciferol 25 MCG (1000 UNIT) tablet Commonly known as: VITAMIN D3 Take 1,000 Units by mouth daily.   clonazePAM 1 MG tablet Commonly known as: KLONOPIN Take 1 mg by mouth at bedtime.   docusate sodium 100 MG capsule Commonly known as: COLACE Take 1 capsule (100 mg total) by mouth 2 (two) times daily.   gabapentin 300 MG capsule Commonly known as: NEURONTIN Take 300 mg by mouth 3 (three) times daily.   guaiFENesin-dextromethorphan 100-10 MG/5ML syrup Commonly known as: ROBITUSSIN DM Take 10 mLs by mouth every 6 (six) hours as needed for cough.   hydrocortisone cream 1 % Apply 1 application topically 2 (two) times daily as needed.   loratadine 10 MG tablet Commonly known as: CLARITIN Take 10 mg by mouth daily.   losartan 25 MG tablet Commonly known as: COZAAR Take 25 mg by mouth daily.  metFORMIN 500 MG tablet Commonly known as: GLUCOPHAGE Take 500 mg by mouth 2 (two) times daily with a meal.   mirtazapine 15 MG tablet Commonly known as: REMERON Take 15 mg by mouth at bedtime.   phenytoin 100 MG ER capsule Commonly known as: DILANTIN Take 100-300 mg by mouth 2 (two) times daily. Take 100 mg by mouth in the morning and 300 mg by mouth at bedtime.   polyethylene glycol 17 g packet Commonly known as: MIRALAX / GLYCOLAX Take 17 g by mouth 2 (two) times daily.   psyllium 58.6 %  powder Commonly known as: METAMUCIL Take 1 packet by mouth 3 (three) times daily.   QUEtiapine 400 MG tablet Commonly known as: SEROQUEL Take 800 mg by mouth at bedtime.   simvastatin 20 MG tablet Commonly known as: ZOCOR Take 20 mg by mouth at bedtime.   tamsulosin 0.4 MG Caps capsule Commonly known as: FLOMAX TAKE 1 CAPSULE BY MOUTH ONCE DAILY   traZODone 100 MG tablet Commonly known as: DESYREL Take 100 mg by mouth at bedtime.        Allergies:  Allergies  Allergen Reactions   Lisinopril Other (See Comments) and Anaphylaxis    Unknown Unknown    Family History: Family History  Family history unknown: Yes    Social History:   reports that he has never smoked. He has never used smokeless tobacco. He reports that he does not currently use drugs. He reports that he does not drink alcohol.  Physical Exam: There were no vitals taken for this visit.  Constitutional:  Alert and oriented, no acute distress, nontoxic appearing HEENT: Axis, AT Cardiovascular: No clubbing, cyanosis, or edema Respiratory: Normal respiratory effort, no increased work of breathing GI: Abdomen is soft, nontender, nondistended, no abdominal masses GU: No CVA tenderness Lymph: No cervical or inguinal lymphadenopathy Skin: No rashes, bruises or suspicious lesions Neurologic: Grossly intact, no focal deficits, moving all 4 extremities Psychiatric: Normal mood and affect  Laboratory Data: Lab Results  Component Value Date   WBC 5.0 10/04/2020   HGB 9.7 (L) 10/04/2020   HCT 30.6 (L) 10/04/2020   MCV 92.4 10/04/2020   PLT 109 (L) 10/04/2020    Lab Results  Component Value Date   CREATININE 1.75 (H) 10/04/2020    CrCl cannot be calculated (Patient's most recent lab result is older than the maximum 21 days allowed.).  Results for orders placed or performed during the hospital encounter of 10/03/20  Resp Panel by RT-PCR (Flu A&B, Covid) Nasopharyngeal Swab   Specimen: Nasopharyngeal  Swab; Nasopharyngeal(NP) swabs in vial transport medium  Result Value Ref Range   SARS Coronavirus 2 by RT PCR NEGATIVE NEGATIVE   Influenza A by PCR NEGATIVE NEGATIVE   Influenza B by PCR NEGATIVE NEGATIVE  Blood Culture (routine x 2)   Specimen: BLOOD LEFT ARM  Result Value Ref Range   Specimen Description BLOOD LEFT ARM    Special Requests      BOTTLES DRAWN AEROBIC AND ANAEROBIC Blood Culture results may not be optimal due to an inadequate volume of blood received in culture bottles   Culture      NO GROWTH 5 DAYS Performed at [facility], [address] [place], [place] [address]    Report Status 10/08/2020 FINAL   Blood Culture (routine x 2)   Specimen: BLOOD  Result Value Ref Range   Specimen Description BLOOD RIGHT WRIST    Special Requests      BOTTLES DRAWN AEROBIC  AND ANAEROBIC Blood Culture adequate volume   Culture      NO GROWTH 5 DAYS Performed at [facility], [address] [place], [place] [address]    Report Status 10/08/2020 FINAL   Urine culture   Specimen: In/Out Cath Urine  Result Value Ref Range   Specimen Description      IN/OUT CATH URINE Performed at [facility], [address], [place], [place] [address]    Special Requests      NONE Performed at [facility], [address] [place], [place] [address]    Culture (A)     <10,000 COLONIES/mL INSIGNIFICANT GROWTH Performed at [facility] [address], [place], [place] [address]    Report Status 10/04/2020 FINAL   Lactic acid, plasma  Result Value Ref Range   Lactic Acid, Venous 2.8 (HH) 0.5 - 1.9 mmol/L  Lactic acid, plasma  Result Value Ref Range   Lactic Acid, Venous 1.7 0.5 - 1.9 mmol/L  Comprehensive metabolic panel  Result Value Ref Range   Sodium 134 (L) 135 - 145 mmol/L   Potassium 4.4 3.5 - 5.1 mmol/L   Chloride 99 98 - 111 mmol/L   CO2 25 22 - 32 mmol/L   Glucose, Bld 215 (H) 70 - 99 mg/dL   BUN 28 (H) 8 - 23 mg/dL    Creatinine, Ser 1.72 (H) 0.61 - 1.24 mg/dL   Calcium 8.7 (L) 8.9 - 10.3 mg/dL   Total Protein 7.9 6.5 - 8.1 g/dL   Albumin 4.0 3.5 - 5.0 g/dL   AST 46 (H) 15 - 41 U/L   ALT 38 0 - 44 U/L   Alkaline Phosphatase 88 38 - 126 U/L   Total Bilirubin 0.7 0.3 - 1.2 mg/dL   GFR, Estimated 44 (L) >60 mL/min   Anion gap 10 5 - 15  CBC WITH DIFFERENTIAL  Result Value Ref Range   WBC 6.5 4.0 - 10.5 K/uL   RBC 3.82 (L) 4.22 - 5.81 MIL/uL   Hemoglobin 11.3 (L) 13.0 - 17.0 g/dL   HCT 34.8 (L) 39.0 - 52.0 %   MCV 91.1 80.0 - 100.0 fL   MCH 29.6 26.0 - 34.0 pg   MCHC 32.5 30.0 - 36.0 g/dL   RDW 12.5 11.5 - 15.5 %   Platelets 117 (L) 150 - 400 K/uL   nRBC 0.0 0.0 - 0.2 %   Neutrophils Relative % 76 %   Neutro Abs 5.0 1.7 - 7.7 K/uL   Lymphocytes Relative 10 %   Lymphs Abs 0.6 (L) 0.7 - 4.0 K/uL   Monocytes Relative 10 %   Monocytes Absolute 0.6 0.1 - 1.0 K/uL   Eosinophils Relative 3 %   Eosinophils Absolute 0.2 0.0 - 0.5 K/uL   Basophils Relative 0 %   Basophils Absolute 0.0 0.0 - 0.1 K/uL   Immature Granulocytes 1 %   Abs Immature Granulocytes 0.03 0.00 - 0.07 K/uL  Protime-INR  Result Value Ref Range   Prothrombin Time 14.2 11.4 - 15.2 seconds   INR 1.1 0.8 - 1.2  APTT  Result Value Ref Range   aPTT 25 24 - 36 seconds  Urinalysis, Complete w Microscopic Urine, Clean Catch  Result Value Ref Range   Color, Urine YELLOW (A) YELLOW   APPearance CLEAR (A) CLEAR   Specific Gravity, Urine 1.013 1.005 - 1.030   pH 5.0 5.0 - 8.0   Glucose, UA >=500 (A) NEGATIVE mg/dL   Hgb urine dipstick  MODERATE (A) NEGATIVE   Bilirubin Urine NEGATIVE NEGATIVE   Ketones, ur NEGATIVE NEGATIVE mg/dL   Protein, ur 30 (A) NEGATIVE mg/dL   Nitrite NEGATIVE NEGATIVE   Leukocytes,Ua NEGATIVE NEGATIVE   RBC / HPF 0-5 0 - 5 RBC/hpf   WBC, UA 0-5 0 - 5 WBC/hpf   Bacteria, UA NONE SEEN NONE SEEN   Squamous Epithelial / LPF 0-5 0 - 5   Mucus PRESENT   Procalcitonin - Baseline  Result Value Ref Range    Procalcitonin 1.13 ng/mL  Hemoglobin A1c  Result Value Ref Range   Hgb A1c MFr Bld 5.3 4.8 - 5.6 %   Mean Plasma Glucose 105 mg/dL  Procalcitonin  Result Value Ref Range   Procalcitonin 1.69 ng/mL  Protime-INR  Result Value Ref Range   Prothrombin Time 14.6 11.4 - 15.2 seconds   INR 1.1 0.8 - 1.2  Cortisol-am, blood  Result Value Ref Range   Cortisol - AM 10.6 6.7 - 22.6 ug/dL  Basic metabolic panel  Result Value Ref Range   Sodium 138 135 - 145 mmol/L   Potassium 4.0 3.5 - 5.1 mmol/L   Chloride 104 98 - 111 mmol/L   CO2 26 22 - 32 mmol/L   Glucose, Bld 120 (H) 70 - 99 mg/dL   BUN 25 (H) 8 - 23 mg/dL   Creatinine, Ser 1.75 (H) 0.61 - 1.24 mg/dL   Calcium 8.1 (L) 8.9 - 10.3 mg/dL   GFR, Estimated 43 (L) >60 mL/min   Anion gap 8 5 - 15  CBC  Result Value Ref Range   WBC 5.0 4.0 - 10.5 K/uL   RBC 3.31 (L) 4.22 - 5.81 MIL/uL   Hemoglobin 9.7 (L) 13.0 - 17.0 g/dL   HCT 30.6 (L) 39.0 - 52.0 %   MCV 92.4 80.0 - 100.0 fL   MCH 29.3 26.0 - 34.0 pg   MCHC 31.7 30.0 - 36.0 g/dL   RDW 12.8 11.5 - 15.5 %   Platelets 109 (L) 150 - 400 K/uL   nRBC 0.0 0.0 - 0.2 %  HIV Antibody (routine testing w rflx)  Result Value Ref Range   HIV Screen 4th Generation wRfx Non Reactive Non Reactive  Glucose, capillary  Result Value Ref Range   Glucose-Capillary 142 (H) 70 - 99 mg/dL  Glucose, capillary  Result Value Ref Range   Glucose-Capillary 166 (H) 70 - 99 mg/dL  Glucose, capillary  Result Value Ref Range   Glucose-Capillary 119 (H) 70 - 99 mg/dL  Glucose, capillary  Result Value Ref Range   Glucose-Capillary 184 (H) 70 - 99 mg/dL  Glucose, capillary  Result Value Ref Range   Glucose-Capillary 129 (H) 70 - 99 mg/dL  Procalcitonin  Result Value Ref Range   Procalcitonin 1.00 ng/mL  Glucose, capillary  Result Value Ref Range   Glucose-Capillary 132 (H) 70 - 99 mg/dL  Glucose, capillary  Result Value Ref Range   Glucose-Capillary 136 (H) 70 - 99 mg/dL  Glucose, capillary  Result  Value Ref Range   Glucose-Capillary 207 (H) 70 - 99 mg/dL  Troponin I (High Sensitivity)  Result Value Ref Range   Troponin I (High Sensitivity) 17 <18 ng/L  Troponin I (High Sensitivity)  Result Value Ref Range   Troponin I (High Sensitivity) 18 (H) <18 ng/L    ***  Pertinent Imaging: *** Results for orders placed during the hospital encounter of 04/09/19  DG Abd 1 View  Narrative CLINICAL DATA:  61 year old male with confusion. Query  metallic foreign body, contraindication to MRI.  EXAM: ABDOMEN - 1 VIEW  COMPARISON:  CT Abdomen and Pelvis 03/09/2019.  FINDINGS: Stable bowel gas pattern from last month. As on the prior CT. No radiopaque foreign body identified. Stable visualized osseous structures. Negative visible left lung base.  IMPRESSION: No acute finding. No contraindication to MRI on these images.   Electronically Signed By: Genevie Ann M.D. On: 04/09/2019 19:35  No results found for this or any previous visit.  No results found for this or any previous visit.  No results found for this or any previous visit.  Results for orders placed during the hospital encounter of 07/12/19  US RENAL  Narrative CLINICAL DATA:  Hydronephrosis  EXAM: RENAL / URINARY TRACT ULTRASOUND COMPLETE  COMPARISON:  CT 07/12/2019  FINDINGS: Right Kidney:  Absent  Left Kidney:  Renal measurements: 13.2 x 7.1 x 6 cm = volume: 294.6 mL. Cortical echogenicity normal. Moderate left hydronephrosis. No mass.  Bladder:  Foley catheter within the empty urinary bladder  Other:  None.  IMPRESSION: 1. Absent right kidney 2. Moderate left hydronephrosis   Electronically Signed By: Donavan Foil M.D. On: 07/14/2019 16:57  No valid procedures specified. No results found for this or any previous visit.  Results for orders placed during the hospital encounter of 03/06/19  CT RENAL STONE STUDY  Narrative CLINICAL DATA:  Hydronephrosis.  EXAM: CT ABDOMEN AND  PELVIS WITHOUT CONTRAST  TECHNIQUE: Multidetector CT imaging of the abdomen and pelvis was performed following the standard protocol without IV contrast.  COMPARISON:  01/08/2017  FINDINGS: Lower chest: Basilar atelectasis bilaterally.  Hepatobiliary: The liver shows diffusely decreased attenuation suggesting fat deposition. Tiny hypodensity anterior liver on 21/2 is similar to prior. Gallbladder not visualized and may be surgically absent. No intrahepatic or extrahepatic biliary dilation.  Pancreas: No focal mass lesion. No dilatation of the main duct. No intraparenchymal cyst. No peripancreatic edema.  Spleen: No splenomegaly. No focal mass lesion.  Adrenals/Urinary Tract: No adrenal nodule or mass. Absent right kidney. Mild to moderate left hydroureteronephrosis. Left kidney demonstrates duplicated collecting system with atrophy of the upper pole moiety. High attenuation material in the upper pole collecting system may represent stones/stone debris. This is not well evaluated on noncontrast imaging. There is perinephric and proximal periureteric edema/stranding. Posterior bladder wall irregularity evident.  Stomach/Bowel: Stomach is decompressed. Duodenum is normally positioned as is the ligament of Treitz. No small bowel wall thickening. No small bowel dilatation. Colon is diffusely dilated with air and stool, similar to prior.  Vascular/Lymphatic: No abdominal aortic aneurysm. No abdominal lymphadenopathy No pelvic sidewall lymphadenopathy.  Reproductive: The prostate gland and seminal vesicles are unremarkable.  Other: No intraperitoneal free fluid.  Musculoskeletal: No worrisome lytic or sclerotic osseous abnormality.  IMPRESSION: 1. Mild to moderate left hydroureteronephrosis with perinephric and periureteric edema/stranding. 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 High attenuation material in the left upper pole collecting system  may represent stones/stone debris and is stable in the interval since prior study. 2. Absent right kidney. 3. Diffuse colonic distention with air and stool, similar to prior. Colonic dysmotility would be a consideration. 4. Hepatic steatosis.   Electronically Signed By: Misty Stanley M.D. On: 03/09/2019 12:12   I personally reviewed the images referenced above and ***.  Assessment & Plan:   1. Rising PSA level *** - Urinalysis, Complete - Bladder Scan (Post Void Residual) in office  2. Benign prostatic hyperplasia with incomplete bladder emptying *** - Urinalysis, Complete - Bladder  Scan (Post Void Residual) in office  3. Recurrent UTI *** - Urinalysis, Complete - Bladder Scan (Post Void Residual) in office   No follow-ups on file.  Debroah Loop, PA-C  [facility] [address], [place], [place] [address] [phone]

## 2022-04-01 ENCOUNTER — Telehealth: Payer: Self-pay | Admitting: Physician Assistant

## 2022-04-01 NOTE — Telephone Encounter (Signed)
Patient scheduled.

## 2022-04-01 NOTE — Telephone Encounter (Signed)
Can you please call this patient and let him and his caregiver know that I spoke with Dr. Erlene Quan about him and she feels that she will be able to do a traditional transrectal prostate biopsy on him at our practice even if he is unable to tolerate bowel prep. Can you please get him scheduled for this and discuss prep (ok if unable to tolerate) as well as risks for infection, hematuria, hematochezia, and hematospermia?

## 2022-04-16 ENCOUNTER — Other Ambulatory Visit: Payer: Medicare Other | Admitting: Urology

## 2022-04-16 ENCOUNTER — Telehealth: Payer: Self-pay | Admitting: Urology

## 2022-04-16 NOTE — Telephone Encounter (Signed)
Pt was a no show for biopsy

## 2022-04-17 ENCOUNTER — Other Ambulatory Visit: Payer: Self-pay | Admitting: Urology

## 2022-04-17 ENCOUNTER — Encounter: Payer: Self-pay | Admitting: Urology

## 2022-05-06 ENCOUNTER — Other Ambulatory Visit: Payer: Medicare Other | Admitting: Urology

## 2022-05-08 ENCOUNTER — Encounter: Payer: Self-pay | Admitting: Urology

## 2022-05-18 ENCOUNTER — Other Ambulatory Visit: Payer: Self-pay | Admitting: Urology

## 2022-05-20 ENCOUNTER — Ambulatory Visit: Payer: Medicare Other | Admitting: Urology

## 2022-06-23 ENCOUNTER — Encounter: Payer: Self-pay | Admitting: Urology

## 2022-06-23 ENCOUNTER — Ambulatory Visit (INDEPENDENT_AMBULATORY_CARE_PROVIDER_SITE_OTHER): Payer: Medicare Other | Admitting: Urology

## 2022-06-23 VITALS — BP 143/79 | HR 103 | Ht 75.0 in | Wt 239.0 lb

## 2022-06-23 DIAGNOSIS — C61 Malignant neoplasm of prostate: Secondary | ICD-10-CM | POA: Diagnosis not present

## 2022-06-23 DIAGNOSIS — R972 Elevated prostate specific antigen [PSA]: Secondary | ICD-10-CM

## 2022-06-23 MED ORDER — LEVOFLOXACIN 500 MG PO TABS
500.0000 mg | ORAL_TABLET | Freq: Once | ORAL | Status: AC
  Administered 2022-06-23: 500 mg via ORAL

## 2022-06-23 MED ORDER — GENTAMICIN SULFATE 40 MG/ML IJ SOLN
80.0000 mg | Freq: Once | INTRAMUSCULAR | Status: AC
  Administered 2022-06-23: 80 mg via INTRAMUSCULAR

## 2022-06-23 NOTE — Progress Notes (Signed)
   06/23/22  CC:  Chief Complaint  Patient presents with   Prostate Biopsy    HPI: 62 year old male who presents today for prostate biopsy.  Please see previous notes for details.  Notably, he had a steadily rising PSA up to 7.0 as of 03/02/2022.  In the past, he is counseled to undergo prostate MRI but no showed to this several times.  He is also no-showed to prostate biopsies and caregivers had concerns that he would not be able to tolerate prep.  He does have a legal guardian who helps with his healthcare decision making.  Verbal consent was obtained from his legal guardian, Clara who also stopped Aaron Edelman drop off his guardianship paperwork.  NED. A&Ox3.   No respiratory distress   Abd soft, NT, ND Normal sphincter tone  Prostate Biopsy Procedure   Informed consent was obtained after discussing risks/benefits of the procedure.  A time out was performed to ensure correct patient identity.  Pre-Procedure: - Gentamicin given prophylactically - Levaquin 500 mg administered PO -Transrectal Ultrasound performed revealing a  gm prostate -No significant hypoechoic or median lobe noted -Funnel shape opening near bladder neck  Procedure: - Prostate block performed using 10 cc 1% lidocaine and biopsies taken from sextant areas, a total of 12 under ultrasound guidance.  Post-Procedure: - Patient tolerated the procedure well - He was counseled to seek immediate medical attention if experiences any severe pain, significant bleeding, or fevers - Return in one week to discuss biopsy results   Hollice Espy, MD

## 2022-06-23 NOTE — Addendum Note (Signed)
Addended by: Kris Mouton on: 06/23/2022 10:22 AM   Modules accepted: Orders

## 2022-06-24 LAB — SURGICAL PATHOLOGY

## 2022-06-30 ENCOUNTER — Ambulatory Visit (INDEPENDENT_AMBULATORY_CARE_PROVIDER_SITE_OTHER): Payer: Medicare Other | Admitting: Urology

## 2022-06-30 ENCOUNTER — Encounter: Payer: Self-pay | Admitting: Urology

## 2022-06-30 ENCOUNTER — Ambulatory Visit: Payer: Medicare Other | Admitting: Urology

## 2022-06-30 VITALS — BP 137/78 | HR 108 | Ht 75.0 in | Wt 230.0 lb

## 2022-06-30 DIAGNOSIS — N390 Urinary tract infection, site not specified: Secondary | ICD-10-CM

## 2022-06-30 DIAGNOSIS — R972 Elevated prostate specific antigen [PSA]: Secondary | ICD-10-CM | POA: Diagnosis not present

## 2022-06-30 DIAGNOSIS — N1330 Unspecified hydronephrosis: Secondary | ICD-10-CM | POA: Diagnosis not present

## 2022-06-30 DIAGNOSIS — C61 Malignant neoplasm of prostate: Secondary | ICD-10-CM | POA: Diagnosis not present

## 2022-06-30 NOTE — Progress Notes (Signed)
I,Amy L Pierron,acting as a scribe for Hollice Espy, MD.,have documented all relevant documentation on the behalf of Hollice Espy, MD,as directed by  Hollice Espy, MD while in the presence of Hollice Espy, MD.  06/30/2022 2:11 PM   Jason Reed 01-Jan-1961 [id number]  Referring provider: Marguerita Merles, [place],  [place] [address]  Chief Complaint  Patient presents with   Follow-up    HPI: 62 year-old male who presents today for a follow up of prostate biopsy.   He has a complicated past medical history including personal history of solitary left kidney with chronic hydroureteronephrosis, recurrent UTI's, incomplete bladder emptying, and rising PSA.   Ultimately it was a difficult time getting him to biopsy for multiple issues. He did have a biopsy on 06/23/2022. TRUS volume was 54.2 grams. He also had a funnel shaped opening consistent with probable previous intervention.   He is accompanied by his wife and she reports he is doing well overall with no new complaints since the biopsy.    PMH: Past Medical History:  Diagnosis Date   Diabetes ([place])    HTN (hypertension)    Hyperlipidemia    Psychosis ([place])    Seizure ([place])     Surgical History: Past Surgical History:  Procedure Laterality Date   COLONOSCOPY WITH PROPOFOL N/A 12/22/2018   Procedure: COLONOSCOPY WITH PROPOFOL;  Surgeon: Virgel Manifold, MD;  Location: [facility];  Service: Endoscopy;  Laterality: N/A;   COLONOSCOPY WITH PROPOFOL N/A 12/23/2018   Procedure: COLONOSCOPY WITH PROPOFOL;  Surgeon: Virgel Manifold, MD;  Location: [facility];  Service: Endoscopy;  Laterality: N/A;   ESOPHAGOGASTRODUODENOSCOPY (EGD) WITH PROPOFOL N/A 12/22/2018   Procedure: ESOPHAGOGASTRODUODENOSCOPY (EGD) WITH PROPOFOL;  Surgeon: Virgel Manifold, MD;  Location: [facility];  Service: Endoscopy;  Laterality: N/A;    Home Medications:  Allergies as of 06/30/2022       Reactions    Lisinopril Other (See Comments), Anaphylaxis   Unknown Unknown        Medication List        Accurate as of June 30, 2022  2:11 PM. If you have any questions, ask your nurse or doctor.          acetaminophen 325 MG tablet Commonly known as: TYLENOL Take 650 mg by mouth every 6 (six) hours as needed.   ARIPiprazole 15 MG tablet Commonly known as: ABILIFY Take 15 mg by mouth daily.   aspirin EC 81 MG tablet Take 81 mg by mouth daily.   cholecalciferol 25 MCG (1000 UT) tablet Generic drug: Cholecalciferol Take 1,000 Units by mouth daily.   clonazePAM 1 MG tablet Commonly known as: KLONOPIN Take 1 mg by mouth at bedtime.   docusate sodium 100 MG capsule Commonly known as: COLACE Take 1 capsule (100 mg total) by mouth 2 (two) times daily.   gabapentin 300 MG capsule Commonly known as: NEURONTIN Take 300 mg by mouth 3 (three) times daily.   guaiFENesin-dextromethorphan 100-10 MG/5ML syrup Commonly known as: ROBITUSSIN DM Take 10 mLs by mouth every 6 (six) hours as needed for cough.   hydrocortisone cream 1 % Apply 1 application topically 2 (two) times daily as needed.   loratadine 10 MG tablet Commonly known as: CLARITIN Take 10 mg by mouth daily.   losartan 25 MG tablet Commonly known as: COZAAR Take 25 mg by mouth daily.   metFORMIN 500 MG tablet Commonly known as: GLUCOPHAGE Take 500 mg by mouth 2 (two) times daily  with a meal.   mirtazapine 15 MG tablet Commonly known as: REMERON Take 15 mg by mouth at bedtime.   phenytoin 100 MG ER capsule Commonly known as: DILANTIN Take 100-300 mg by mouth 2 (two) times daily. Take 100 mg by mouth in the morning and 300 mg by mouth at bedtime.   polyethylene glycol 17 g packet Commonly known as: MIRALAX / GLYCOLAX Take 17 g by mouth 2 (two) times daily.   psyllium 58.6 % powder Commonly known as: METAMUCIL Take 1 packet by mouth 3 (three) times daily.   QUEtiapine 400 MG tablet Commonly known as:  SEROQUEL Take 800 mg by mouth at bedtime.   simvastatin 20 MG tablet Commonly known as: ZOCOR Take 20 mg by mouth at bedtime.   tamsulosin 0.4 MG Caps capsule Commonly known as: FLOMAX TAKE 1 CAPSULE BY MOUTH ONCE DAILY   traZODone 100 MG tablet Commonly known as: DESYREL Take 100 mg by mouth at bedtime.        Allergies:  Allergies  Allergen Reactions   Lisinopril Other (See Comments) and Anaphylaxis    Unknown Unknown    Family History: Family History  Family history unknown: Yes    Social History:  reports that he has never smoked. He has never used smokeless tobacco. He reports that he does not currently use drugs. He reports that he does not drink alcohol.   Physical Exam: BP 137/78   Pulse (!) 108   Ht '6\' 3"'$  (1.905 m)   Wt 230 lb (104.3 kg)   BMI 28.75 kg/m   Constitutional:  Alert and oriented, No acute distress. HEENT: [place] AT, moist mucus membranes.  Trachea midline, no masses. Neurologic: Grossly intact, no focal deficits, moving all 4 extremities. Psychiatric: Normal mood and affect.   Assessment & Plan:  Prostate Cancer; newly diagnosed, low risk.     - Based on his overall PSA and prostate volume, he falls in the very lowest category.   - Would recommend active surveillance.  - PSA on a q6 month basis and rectal exam annually.  - Consider prostate MRI for additional intervention.   2. Recurrent UTI/ chronic left hydronephrosis  - Presumably related to reflux and has been stable many years.   - His most recent creatinine was at his baseline around 1.5 in February. We'll continue to manage this conservatively.   - He has not had any infections in the recent past.   - Continue chronic Flomax.    Return in about 6 months (around 12/29/2022) for PSA and PVR.   [place] [address], [place],  [address] [phone]

## 2022-07-17 ENCOUNTER — Other Ambulatory Visit: Payer: Self-pay | Admitting: Urology

## 2022-09-30 ENCOUNTER — Other Ambulatory Visit: Payer: Medicare Other

## 2022-10-06 ENCOUNTER — Ambulatory Visit: Payer: Medicare Other | Admitting: Urology

## 2022-10-19 IMAGING — CT CT ABD-PELV W/O CM
2 of 4 series · 16 of 46 positions shown, 18 images · non-contrast
Comparison: Ultrasound 07/14/2019, CT 07/12/2019

CLINICAL DATA: Weakness, urinary incontinence

EXAM:
CT ABDOMEN AND PELVIS WITHOUT CONTRAST
TECHNIQUE: Multidetector CT imaging of the abdomen and pelvis was performed
following the standard protocol without IV contrast.

[Series 2: routine abd/pel wo · axial · 0.95mm/px · z∈[-789,-294]mm · 13 of 109 slices shown, 15 images]
[im 5/109  soft-tissue]
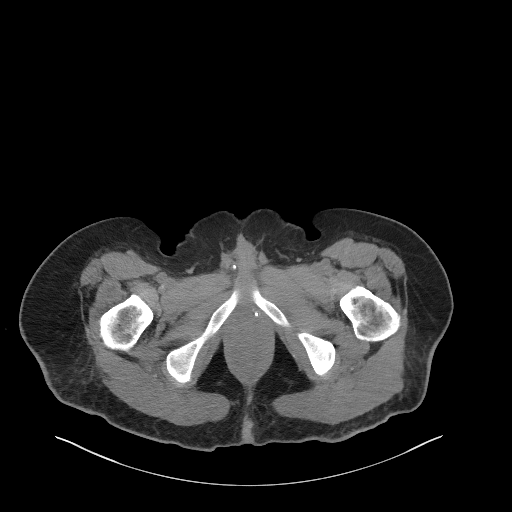
[im 5/109  bone]
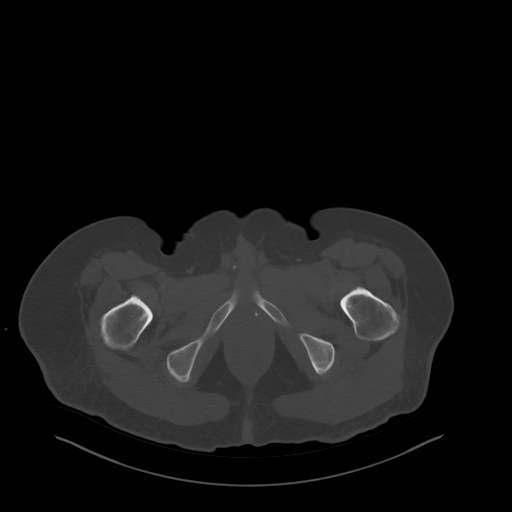
[im 15/109  soft-tissue]
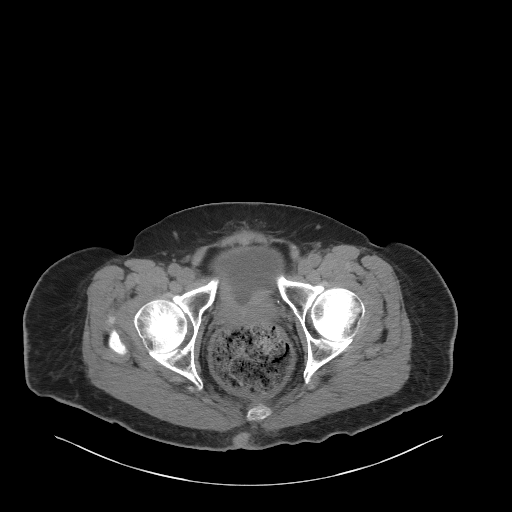
[im 24/109  soft-tissue]
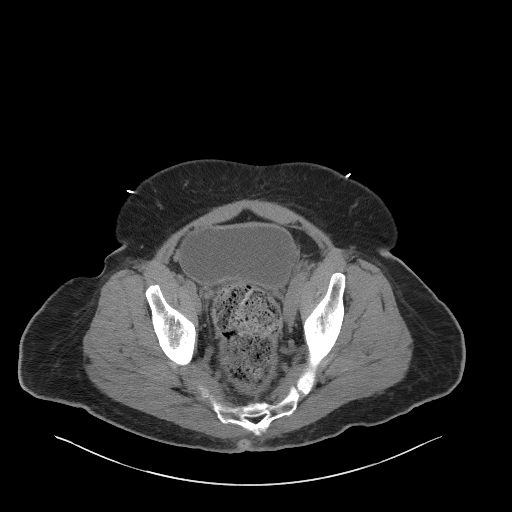
[im 29/109  soft-tissue]
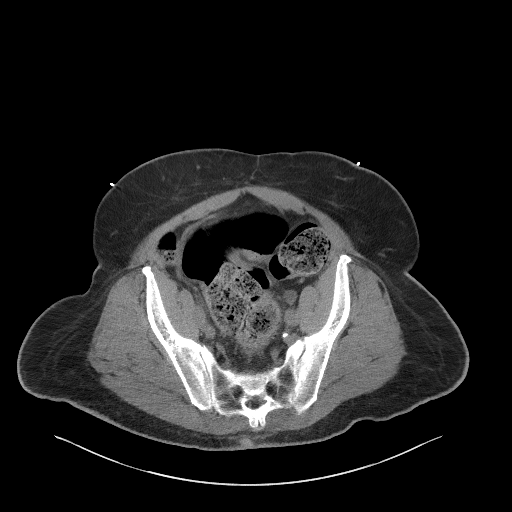
[im 38/109  soft-tissue]
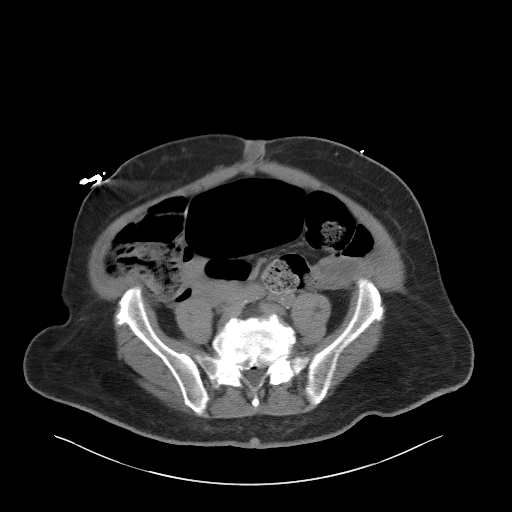
[im 47/109  soft-tissue]
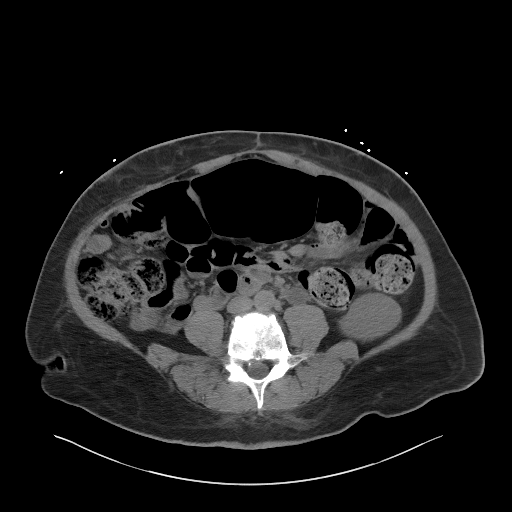
[im 57/109  soft-tissue]
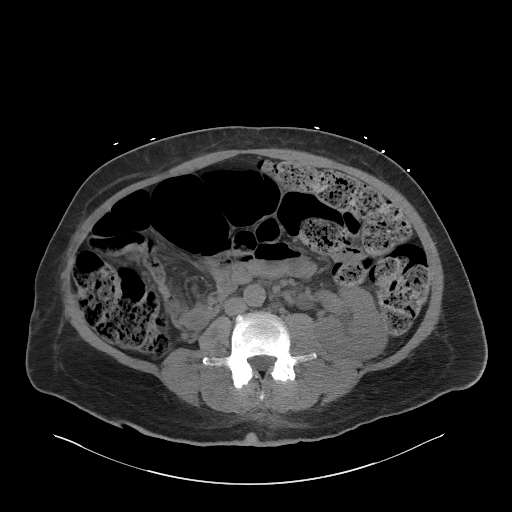
[im 62/109  soft-tissue]
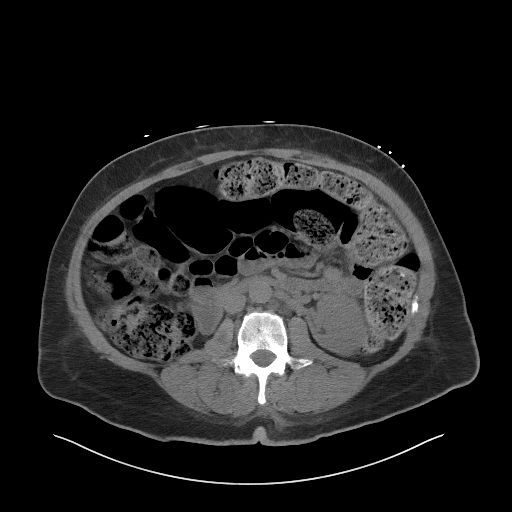
[im 71/109  soft-tissue]
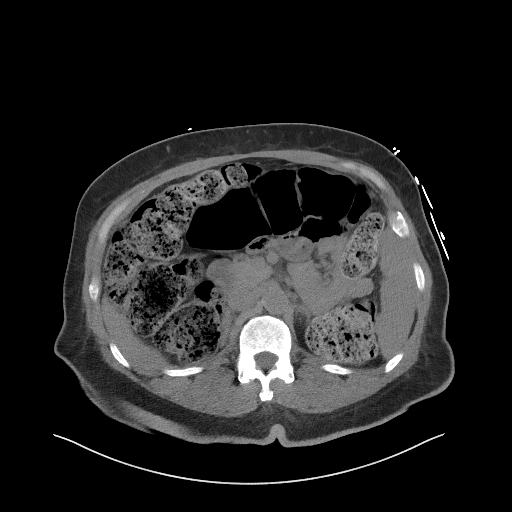
[im 71/109  bone]
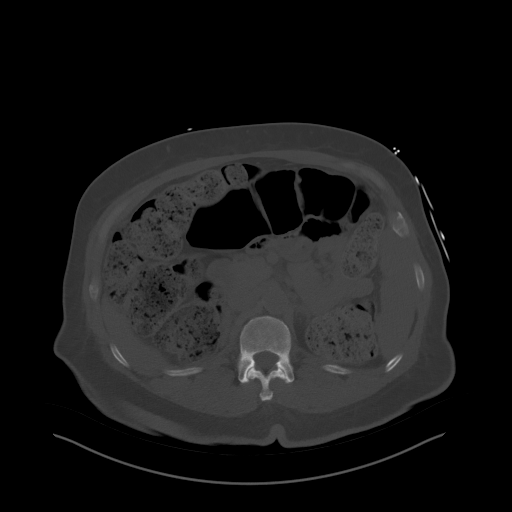
[im 80/109  soft-tissue]
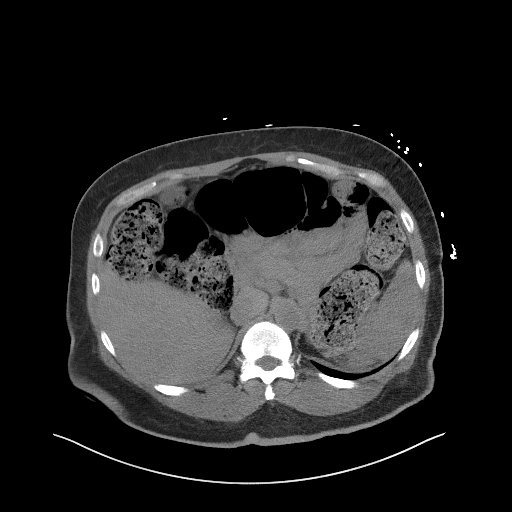
[im 85/109  soft-tissue]
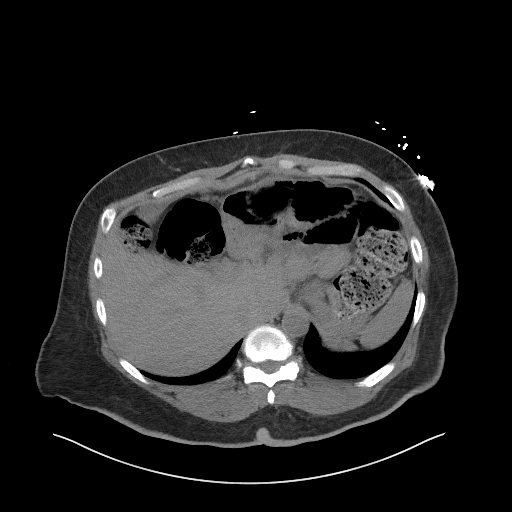
[im 94/109  soft-tissue]
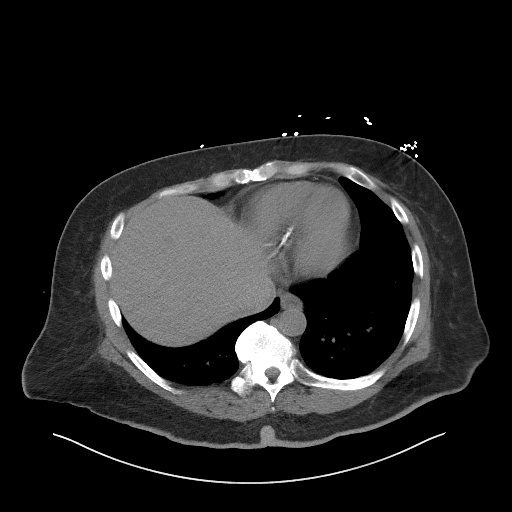
[im 104/109  soft-tissue]
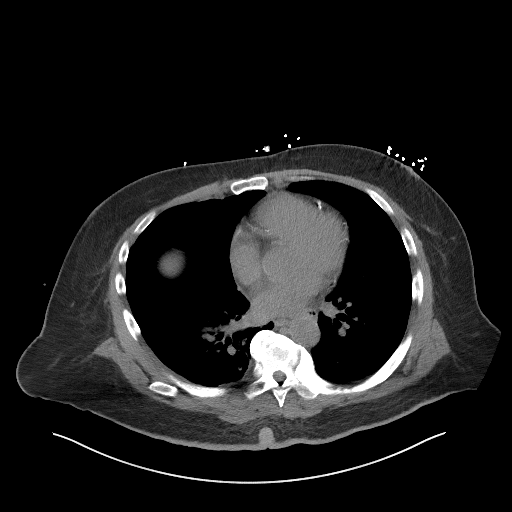

[Series 5: coronal st · coronal · 0.90mm/px · 3 of 104 slices shown]
[im 35/104  soft-tissue]
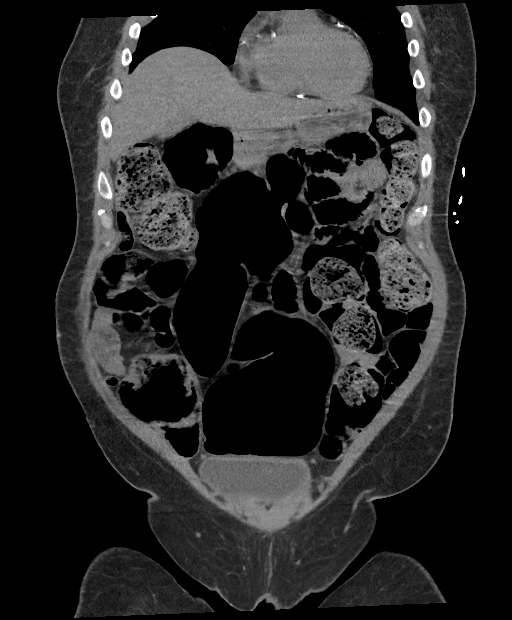
[im 46/104  soft-tissue]
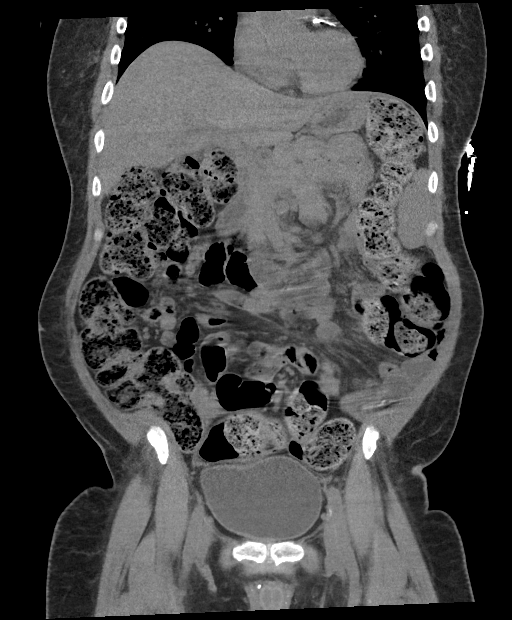
[im 58/104  soft-tissue]
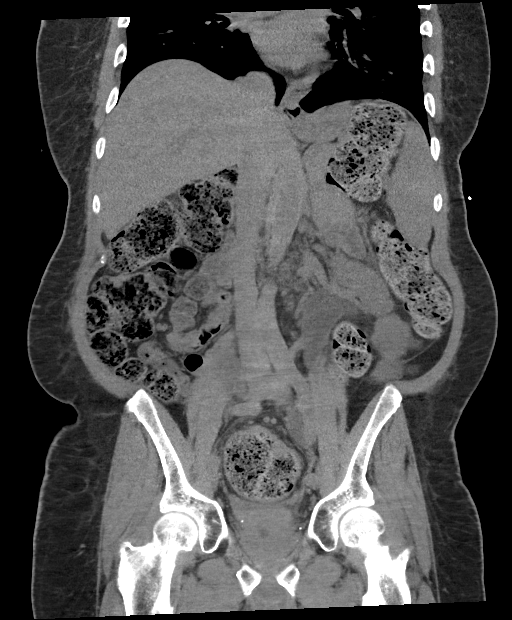

[16 of 46 positions shown; findings below may reference images not displayed]

FINDINGS: Lower chest: Bibasilar atelectasis. Heart size is normal. Extensive
coronary artery calcification.

Hepatobiliary: Stable subcentimeter low-density lesion within the
right hepatic dome, likely cyst. No hyperdense gallstone. No biliary
dilatation.

Pancreas: Grossly unremarkable.

Spleen: Normal in size without focal abnormality.

Adrenals/Urinary Tract: Unremarkable adrenal glands. Absent right
kidney. Duplicated left renal collecting system with mild-moderate
left hydroureteronephrosis, similar in appearance to prior. No renal
or ureteral calculus is identified. Urinary bladder is within normal
limits.

Stomach/Bowel: Stomach is unremarkable. No dilated loops of small
bowel. Large volume of stool throughout the colon including rectal
stool ball measuring 7.5 x 7.5 cm in diameter with associated mild
rectal wall thickening. No pneumatosis.

Vascular/Lymphatic: No significant vascular findings are evident on
noncontrast exam. No enlarged abdominal or pelvic lymph nodes.

Reproductive: Mildly prominent prostate gland, similar to prior.

Other: No free fluid. No abdominopelvic fluid collection. No
pneumoperitoneum. Small fat containing umbilical hernia.

Musculoskeletal: Unchanged retrolisthesis of L5 on S1. Multilevel
degenerative changes of the thoracic and lumbar spine. No new or
acute osseous findings.
IMPRESSION: 1. Large volume of stool throughout the colon including large rectal
stool ball with associated mild rectal wall thickening. Findings are
suggestive of constipation and possible fecal impaction.
2. Mild-moderate left hydroureteronephrosis, similar in appearance
to prior study. No renal or ureteral calculus is identified. A
component of mechanical obstruction related to rectosigmoid stool
burden is a consideration.
3. Coronary artery atherosclerosis.

## 2022-10-19 IMAGING — CT CT CHEST W/O CM
2 of 3 series · 15 of 36 positions shown, 18 images · non-contrast
Comparison: CT 07/12/2019 and previous

CLINICAL DATA: Weakness

EXAM:
CT CHEST WITHOUT CONTRAST
TECHNIQUE: Multidetector CT imaging of the chest was performed following the
standard protocol without IV contrast.

[Series 2: thorax · axial · 0.80mm/px · z∈[-214,+44]mm · 12 of 153 slices shown, 15 images]
[im 12/153  mediastinal]
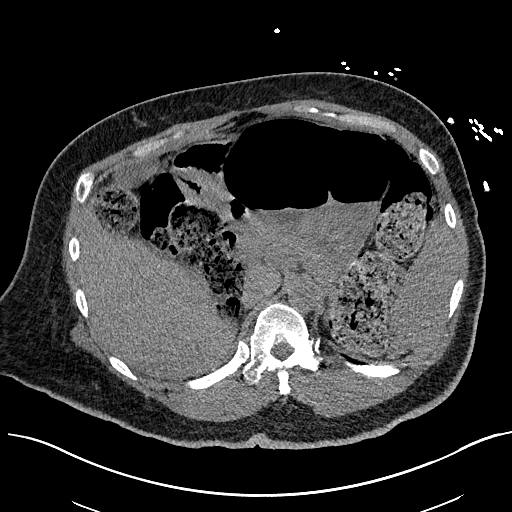
[im 12/153  lung]
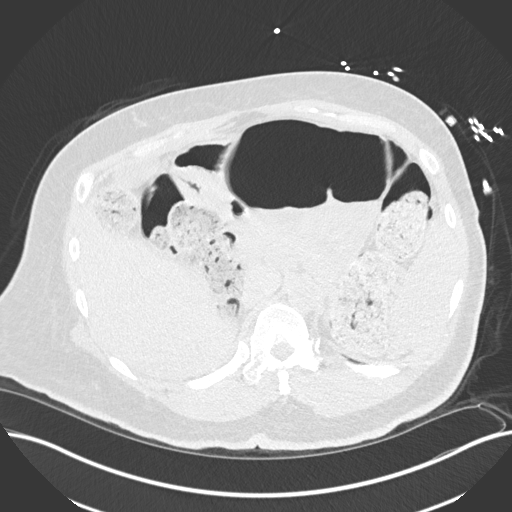
[im 23/153  lung]
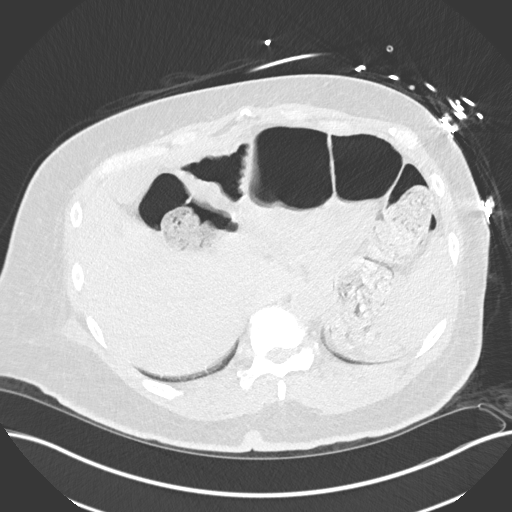
[im 34/153  lung]
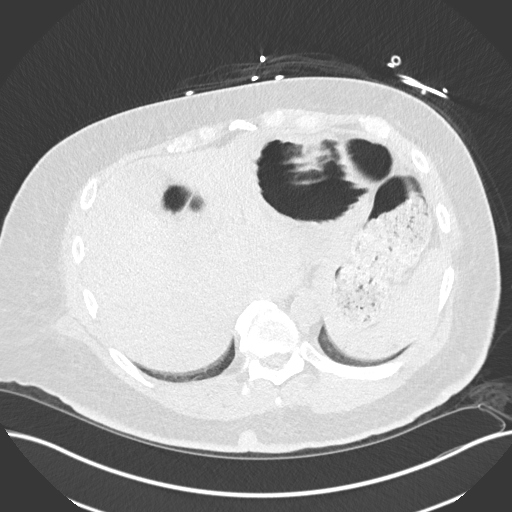
[im 46/153  lung]
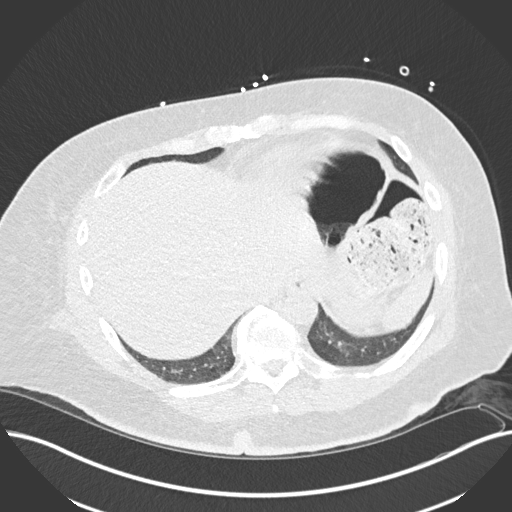
[im 57/153  mediastinal]
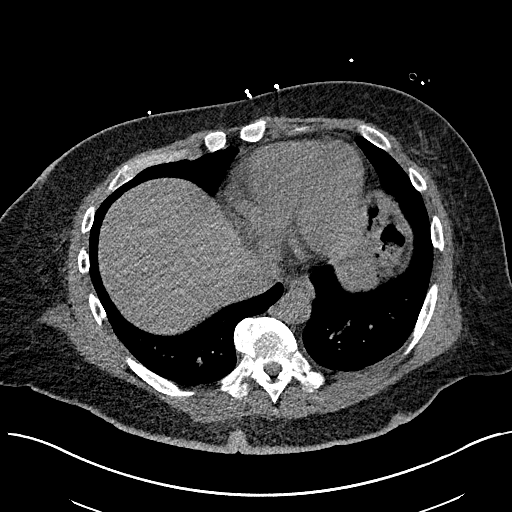
[im 57/153  lung]
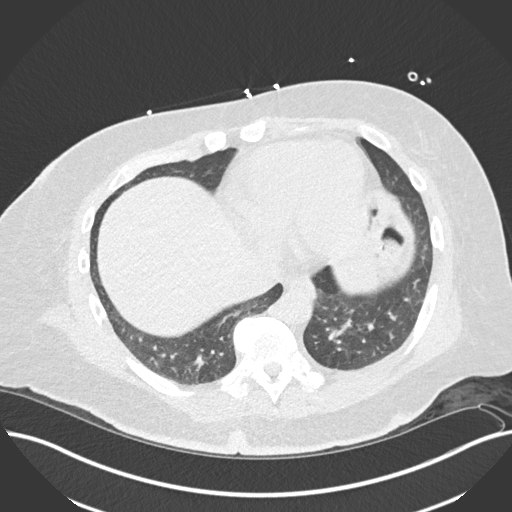
[im 68/153  lung]
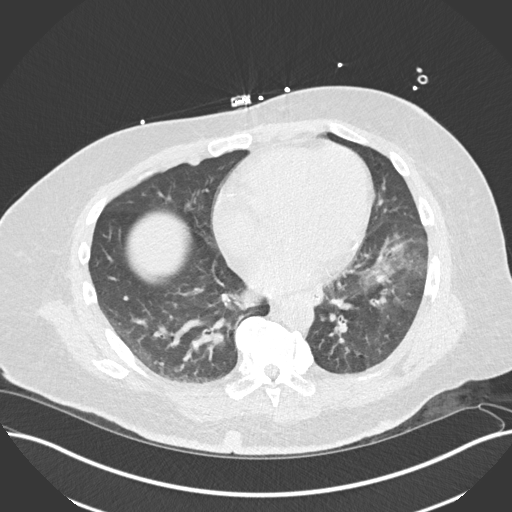
[im 85/153  lung]
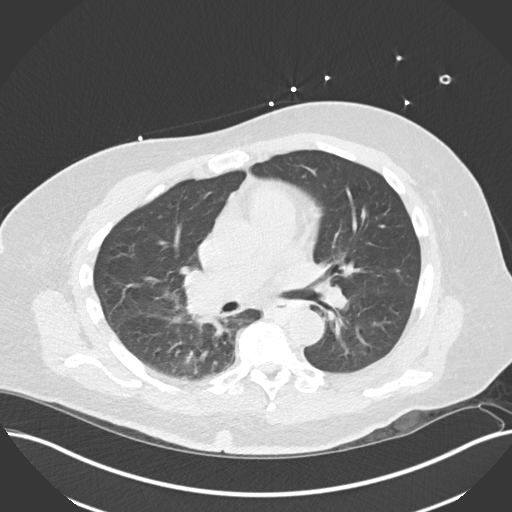
[im 96/153  lung]
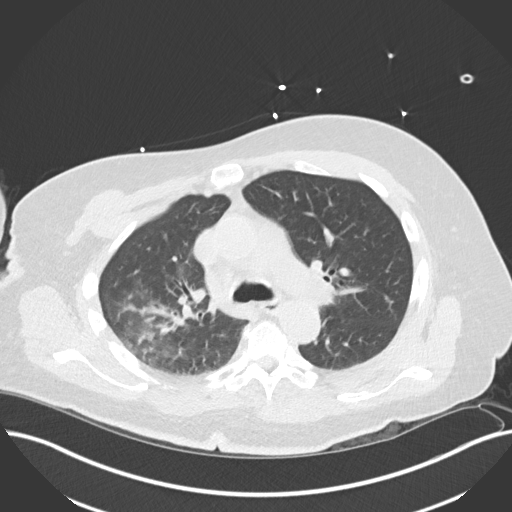
[im 107/153  mediastinal]
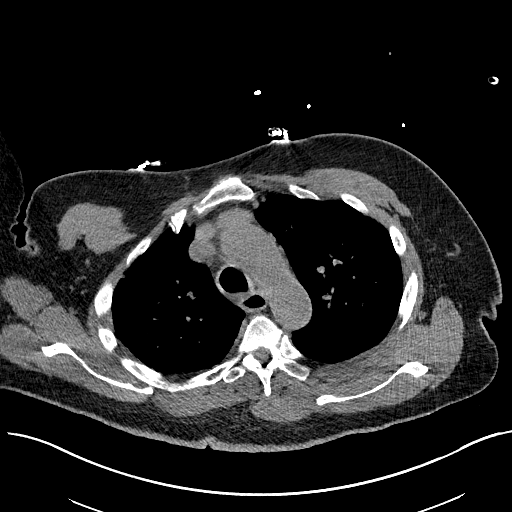
[im 107/153  lung]
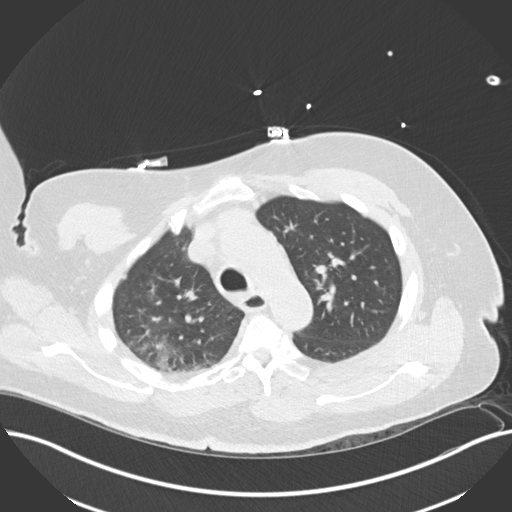
[im 119/153  lung]
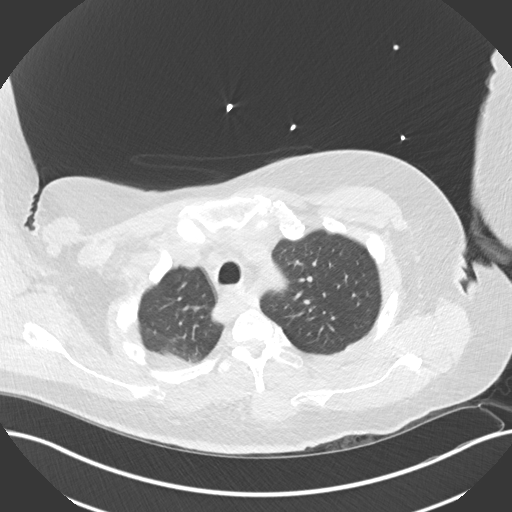
[im 130/153  lung]
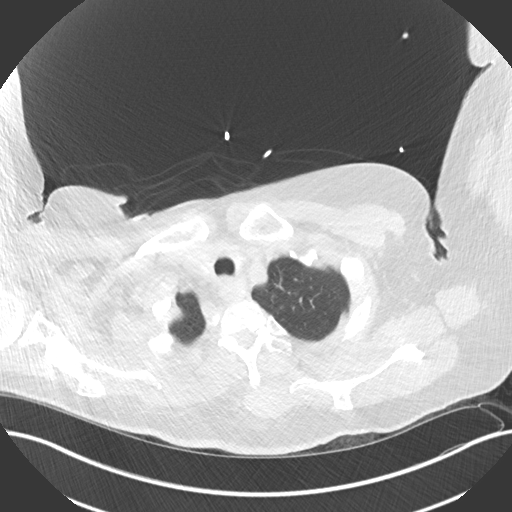
[im 141/153  lung]
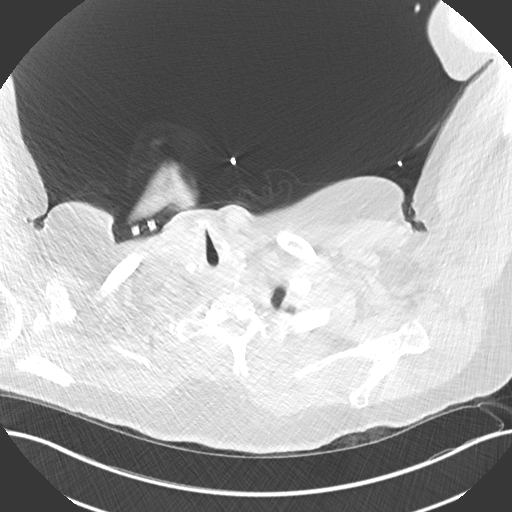

[Series 5: coronal · coronal · 0.60mm/px · 3 of 138 slices shown]
[im 28/138  lung]
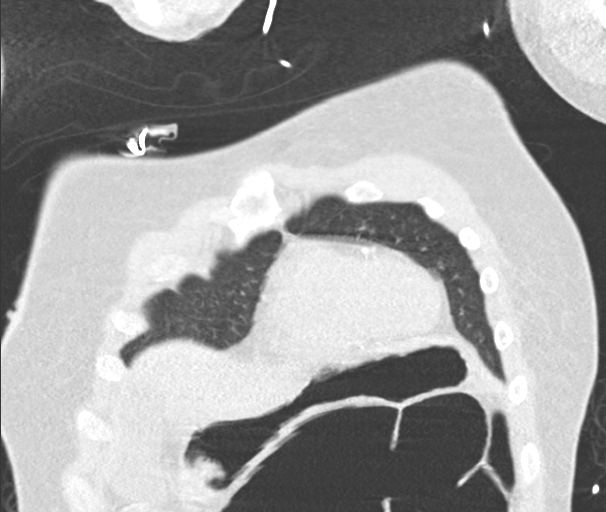
[im 55/138  lung]
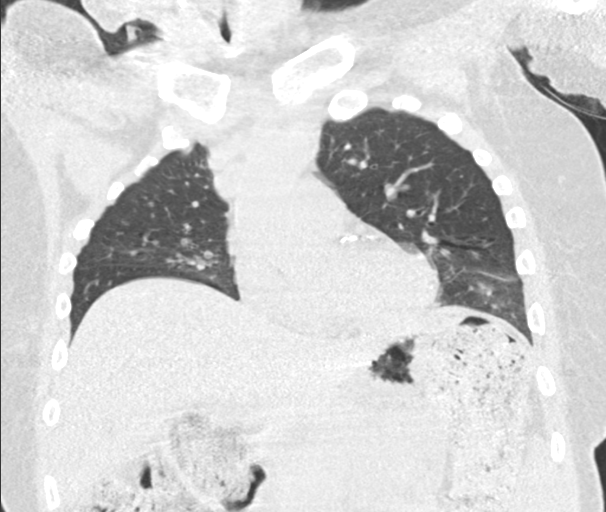
[im 83/138  lung]
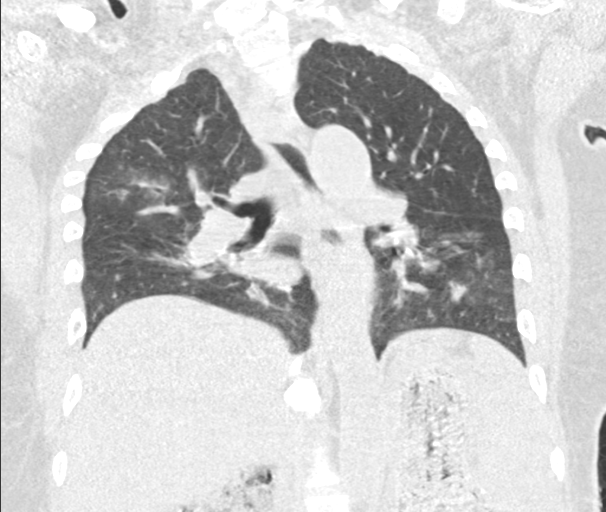

[15 of 36 positions shown; findings below may reference images not displayed]

FINDINGS: Cardiovascular: Aberrant retroesophageal right subclavian artery, an
anatomic variant. Extensive coronary calcifications. Heart size
normal. No pericardial effusion.

Mediastinum/Nodes: No mass or adenopathy.

Lungs/Pleura: No pleural effusion. No pneumothorax. Poorly
marginated airspace and ground-glass opacities in the apical and
posterior segments right upper lobe, and in the anterior basal
segment left lower lobe.

Musculoskeletal: Anterior vertebral endplate spurring at multiple
levels in the mid and lower thoracic spine.
IMPRESSION: 1. Ill-defined right upper lobe and left lower lobe pulmonary
opacities, suspect infectious/inflammatory etiology.
2. Coronary calcifications.

## 2022-10-31 ENCOUNTER — Other Ambulatory Visit: Payer: Self-pay

## 2022-10-31 ENCOUNTER — Emergency Department
Admission: EM | Admit: 2022-10-31 | Discharge: 2022-10-31 | Disposition: A | Discharge status: Home / Self Care | Payer: Medicare Other | Attending: Emergency Medicine | Admitting: Emergency Medicine

## 2022-10-31 DIAGNOSIS — E119 Type 2 diabetes mellitus without complications: Secondary | ICD-10-CM | POA: Diagnosis not present

## 2022-10-31 DIAGNOSIS — I10 Essential (primary) hypertension: Secondary | ICD-10-CM | POA: Diagnosis not present

## 2022-10-31 DIAGNOSIS — N3000 Acute cystitis without hematuria: Secondary | ICD-10-CM | POA: Insufficient documentation

## 2022-10-31 DIAGNOSIS — R531 Weakness: Secondary | ICD-10-CM | POA: Insufficient documentation

## 2022-10-31 LAB — URINALYSIS, ROUTINE W REFLEX MICROSCOPIC
Bilirubin Urine: NEGATIVE
Glucose, UA: >=500 mg/dL — AB
Ketones, ur: NEGATIVE mg/dL
Leukocytes,Ua: NEGATIVE
Nitrite: POSITIVE — AB
Protein, ur: 100 mg/dL — AB
Specific Gravity, Urine: 1.015 (ref 1.005–1.030)
pH: 5 (ref 5.0–8.0)

## 2022-10-31 LAB — COMPREHENSIVE METABOLIC PANEL
ALT: 29 U/L (ref 0–44)
AST: 68 U/L — ABNORMAL HIGH (ref 15–41)
Albumin: 3.5 g/dL (ref 3.5–5.0)
Alkaline Phosphatase: 81 U/L (ref 38–126)
Anion gap: 10 (ref 5–15)
BUN: 30 mg/dL — ABNORMAL HIGH (ref 8–23)
CO2: 23 mmol/L (ref 22–32)
Calcium: 8.3 mg/dL — ABNORMAL LOW (ref 8.9–10.3)
Chloride: 103 mmol/L (ref 98–111)
Creatinine, Ser: 1.62 mg/dL — ABNORMAL HIGH (ref 0.61–1.24)
GFR, Estimated: 47 mL/min — ABNORMAL LOW (ref >60)
Glucose, Bld: 201 mg/dL — ABNORMAL HIGH (ref 70–99)
Potassium: 3.8 mmol/L (ref 3.5–5.1)
Sodium: 136 mmol/L (ref 135–145)
Total Bilirubin: 0.7 mg/dL (ref 0.3–1.2)
Total Protein: 7.2 g/dL (ref 6.5–8.1)

## 2022-10-31 LAB — CBC
HCT: 36.7 % — ABNORMAL LOW (ref 39.0–52.0)
Hemoglobin: 11.4 g/dL — ABNORMAL LOW (ref 13.0–17.0)
MCH: 28.9 pg (ref 26.0–34.0)
MCHC: 31.1 g/dL (ref 30.0–36.0)
MCV: 92.9 fL (ref 80.0–100.0)
Platelets: 119 10*3/uL — ABNORMAL LOW (ref 150–400)
RBC: 3.95 MIL/uL — ABNORMAL LOW (ref 4.22–5.81)
RDW: 12.6 % (ref 11.5–15.5)
WBC: 4.7 10*3/uL (ref 4.0–10.5)
nRBC: 0 % (ref 0.0–0.2)

## 2022-10-31 MED ORDER — SODIUM CHLORIDE 0.9 % IV SOLN: Freq: Once | INTRAVENOUS | Status: AC

## 2022-10-31 MED ORDER — SODIUM CHLORIDE 0.9 % IV SOLN
1.0000 g | Freq: Once | INTRAVENOUS | Status: AC
  Administered 2022-10-31: 1 g via INTRAVENOUS
  Filled 2022-10-31: qty 10

## 2022-10-31 MED ORDER — CEPHALEXIN 500 MG PO CAPS: 500.0000 mg | ORAL_CAPSULE | Freq: Three times a day (TID) | ORAL | 0 refills | Status: AC

## 2022-10-31 NOTE — ED Provider Notes (Signed)
[facility] Provider Note    Event Date/Time   First MD Initiated Contact with Patient 10/31/22 1049     (approximate)   History  Generalized weakness   HPI  Jason Reed is a 62 y.o. male with a history of diabetes, hypertension, seizure disorder who presents with generalized weakness per caregiver she reports patient's p.o. intake has decreased and she is concerned that he could be dehydrated.  She reports in the past he has had urinary tract infections or is dehydrated and that typically causes weakness.  No abdominal pain.  No chest pain.  No fevers reported     Physical Exam   Triage Vital Signs: ED Triage Vitals [10/31/22 1044]  Enc Vitals Group     BP (!) 145/83     Pulse Rate 97     Resp 16     Temp 98.3 F (36.8 C)     Temp Source Oral     SpO2 99 %     Weight 104.3 kg (229 lb 15 oz)     Height      Head Circumference      Peak Flow      Pain Score 0     Pain Loc      Pain Edu?      Excl. in GC?     Most recent vital signs: Vitals:   10/31/22 1044  BP: (!) 145/83  Pulse: 97  Resp: 16  Temp: 98.3 F (36.8 C)  SpO2: 99%     General: Awake, no distress.  CV:  Good peripheral perfusion.  Resp:  Normal effort.  Clear to auscultation bilaterally Abd:  No distention.  Soft, nontender Other:  No skin rashes noted   ED Results / Procedures / Treatments   Labs (all labs ordered are listed, but only abnormal results are displayed) Labs Reviewed  CBC - Abnormal; Notable for the following components:      Result Value   RBC 3.95 (*)    Hemoglobin 11.4 (*)    HCT 36.7 (*)    Platelets 119 (*)    All other components within normal limits  COMPREHENSIVE METABOLIC PANEL - Abnormal; Notable for the following components:   Glucose, Bld 201 (*)    BUN 30 (*)    Creatinine, Ser 1.62 (*)    Calcium 8.3 (*)    AST 68 (*)    GFR, Estimated 47 (*)    All other components within normal limits  URINALYSIS, ROUTINE W REFLEX  MICROSCOPIC - Abnormal; Notable for the following components:   Color, Urine YELLOW (*)    APPearance HAZY (*)    Glucose, UA >=500 (*)    Hgb urine dipstick LARGE (*)    Protein, ur 100 (*)    Nitrite POSITIVE (*)    Bacteria, UA MANY (*)    All other components within normal limits  URINE CULTURE     EKG     RADIOLOGY     PROCEDURES:  Critical Care performed:   Procedures   MEDICATIONS ORDERED IN ED: Medications  0.9 %  sodium chloride infusion ( Intravenous New Bag/Given 10/31/22 1219)  cefTRIAXone (ROCEPHIN) 1 g in sodium chloride 0.9 % 100 mL IVPB (1 g Intravenous New Bag/Given 10/31/22 1256)     IMPRESSION / MDM / ASSESSMENT AND PLAN / ED COURSE  I reviewed the triage vital signs and the nursing notes. Patient's presentation is most consistent with acute presentation with potential threat to  life or bodily function.  Patient presents with generalized weakness as detailed above, vital signs overall reassuring.  Differential includes dehydration, infection, electrolyte abnormality.  Will obtain labs, give IV fluids and reevaluate.  Urinalysis is consistent with urinary tract infection, lab work otherwise reassuring.  Patient treated with a dose of IV Rocephin here, appropriate for discharge with outpatient antibiotics      FINAL CLINICAL IMPRESSION(S) / ED DIAGNOSES   Final diagnoses:  Acute cystitis without hematuria     Rx / DC Orders   ED Discharge Orders          Ordered    cephALEXin (KEFLEX) 500 MG capsule  3 times daily        10/31/22 1341             Note:  This document was prepared using Dragon voice recognition software and may include unintentional dictation errors.   Jene Every, MD 10/31/22 1341

## 2022-10-31 NOTE — ED Triage Notes (Signed)
Arrives with caregiver who states patient has been falling and incontinent of urine and stool since yesterday.    Patient is AAOx3.  Skin warm and dry. NAD.

## 2022-10-31 NOTE — ED Notes (Signed)
Pt's caregiver Jamerson Lebovits) called and updated that pt will be discharged with antibiotics.

## 2022-11-01 LAB — URINE CULTURE

## 2022-11-02 LAB — URINE CULTURE: Culture: >=100000 — AB

## 2022-11-04 LAB — URINE CULTURE

## 2022-12-31 ENCOUNTER — Other Ambulatory Visit: Payer: Medicare Other

## 2022-12-31 DIAGNOSIS — R972 Elevated prostate specific antigen [PSA]: Secondary | ICD-10-CM

## 2023-01-01 LAB — PSA: Prostate Specific Ag, Serum: 5.9 ng/mL — ABNORMAL HIGH (ref 0.0–4.0)

## 2023-01-06 ENCOUNTER — Ambulatory Visit: Payer: Medicare Other | Admitting: Urology

## 2023-02-10 ENCOUNTER — Ambulatory Visit: Payer: Medicare Other | Admitting: Urology

## 2023-02-10 VITALS — BP 135/80 | HR 91 | Ht 75.0 in | Wt 251.1 lb

## 2023-02-10 DIAGNOSIS — N401 Enlarged prostate with lower urinary tract symptoms: Secondary | ICD-10-CM

## 2023-02-10 DIAGNOSIS — R351 Nocturia: Secondary | ICD-10-CM

## 2023-02-10 DIAGNOSIS — C61 Malignant neoplasm of prostate: Secondary | ICD-10-CM | POA: Diagnosis not present

## 2023-02-10 DIAGNOSIS — N390 Urinary tract infection, site not specified: Secondary | ICD-10-CM

## 2023-02-10 DIAGNOSIS — Z87440 Personal history of urinary (tract) infections: Secondary | ICD-10-CM | POA: Diagnosis not present

## 2023-02-10 DIAGNOSIS — R972 Elevated prostate specific antigen [PSA]: Secondary | ICD-10-CM

## 2023-02-10 DIAGNOSIS — R339 Retention of urine, unspecified: Secondary | ICD-10-CM

## 2023-02-10 LAB — BLADDER SCAN AMB NON-IMAGING: Scan Result: 28

## 2023-02-10 NOTE — Progress Notes (Signed)
Jason Reed,acting as a scribe for Jason Scotland, MD.,have documented all relevant documentation on the behalf of Jason Scotland, MD,as directed by  Jason Scotland, MD while in the presence of Jason Scotland, MD.  02/10/2023 9:56 AM   Jason Reed 25-Mar-1961 [id number]  Referring provider: Leanna Sato, MD [address] [place],  [place] [address]  Chief Complaint  Patient presents with   Elevated PSA   Recurrent UTI    HPI: 62 year-old male who presents today for follow up of prostate cancer.   He has a complicated medical history including solitary left kidney with chronic hydronephrosis, recurrent UTIs, incomplete bladder emptying, and now low-risk prostate cancer. He ultimately underwent biopsy on 06/23/2022, revealing a 54 gram prostate. He had a funnel-shaped opening consistent with probable previous prostate intervention. He had low volume, low risk prostate cancer, Gleason 3+3 in only 1 of 12 cores, 4% involvement.   His most recent PSA on 12/31/2022 was 5.9, which is down from his previous of 6.6, which is reassuring.   Today, he reports experiencing nocturia and having accidents if not woken up. He is currently on Flomax.    Results for orders placed or performed in visit on 02/10/23  Bladder Scan (Post Void Residual) in office  Result Value Ref Range   Scan Result 28 ml      PMH: Past Medical History:  Diagnosis Date   Diabetes (HCC)    HTN (hypertension)    Hyperlipidemia    Psychosis (HCC)    Seizure (HCC)     Surgical History: Past Surgical History:  Procedure Laterality Date   COLONOSCOPY WITH PROPOFOL N/A 12/22/2018   Procedure: COLONOSCOPY WITH PROPOFOL;  Surgeon: Pasty Spillers, MD;  Location: [facility] ENDOSCOPY;  Service: Endoscopy;  Laterality: N/A;   COLONOSCOPY WITH PROPOFOL N/A 12/23/2018   Procedure: COLONOSCOPY WITH PROPOFOL;  Surgeon: Pasty Spillers, MD;  Location: [facility] ENDOSCOPY;  Service: Endoscopy;  Laterality: N/A;    ESOPHAGOGASTRODUODENOSCOPY (EGD) WITH PROPOFOL N/A 12/22/2018   Procedure: ESOPHAGOGASTRODUODENOSCOPY (EGD) WITH PROPOFOL;  Surgeon: Pasty Spillers, MD;  Location: [facility] ENDOSCOPY;  Service: Endoscopy;  Laterality: N/A;    Home Medications:  Allergies as of 02/10/2023       Reactions   Lisinopril Other (See Comments), Anaphylaxis   Unknown Unknown        Medication List        Accurate as of February 10, 2023  9:56 AM. If you have any questions, ask your nurse or doctor.          STOP taking these medications    hydrocortisone cream 1 %   metFORMIN 500 MG tablet Commonly known as: GLUCOPHAGE   psyllium 58.6 % powder Commonly known as: METAMUCIL       TAKE these medications    acetaminophen 325 MG tablet Commonly known as: TYLENOL Take 650 mg by mouth every 6 (six) hours as needed.   ARIPiprazole 15 MG tablet Commonly known as: ABILIFY Take 15 mg by mouth daily.   aspirin EC 81 MG tablet Take 81 mg by mouth daily.   cholecalciferol 25 MCG (1000 UNIT) tablet Commonly known as: VITAMIN D3 Take 1,000 Units by mouth daily.   clonazePAM 1 MG tablet Commonly known as: KLONOPIN Take 1 mg by mouth at bedtime.   Dermacloud Oint Apply topically. As needed   diphenhydrAMINE 25 MG tablet Commonly known as: SOMINEX Take 25 mg by mouth at bedtime as needed for sleep.   docusate sodium  100 MG capsule Commonly known as: COLACE Take 1 capsule (100 mg total) by mouth 2 (two) times daily.   eucerin lotion Apply topically as needed for dry skin.   gabapentin 300 MG capsule Commonly known as: NEURONTIN Take 300 mg by mouth 3 (three) times daily.   guaiFENesin-dextromethorphan 100-10 MG/5ML syrup Commonly known as: ROBITUSSIN DM Take 10 mLs by mouth every 6 (six) hours as needed for cough.   Iron 325 (65 Fe) MG Tabs Take 1 tablet by mouth daily.   loratadine 10 MG tablet Commonly known as: CLARITIN Take 10 mg by mouth daily.   losartan 25 MG  tablet Commonly known as: COZAAR Take 25 mg by mouth daily.   melatonin 5 MG Tabs Take 5 mg by mouth.   mirtazapine 15 MG tablet Commonly known as: REMERON Take 15 mg by mouth at bedtime.   phenytoin 100 MG ER capsule Commonly known as: DILANTIN Take 100-300 mg by mouth 2 (two) times daily. Take 100 mg by mouth in the morning and 300 mg by mouth at bedtime.   polyethylene glycol 17 g packet Commonly known as: MIRALAX / GLYCOLAX Take 17 g by mouth 2 (two) times daily.   QUEtiapine 400 MG tablet Commonly known as: SEROQUEL Take 800 mg by mouth at bedtime.   simvastatin 20 MG tablet Commonly known as: ZOCOR Take 20 mg by mouth at bedtime.   tamsulosin 0.4 MG Caps capsule Commonly known as: FLOMAX TAKE 1 CAPSULE BY MOUTH DAILY   traZODone 100 MG tablet Commonly known as: DESYREL Take 100 mg by mouth at bedtime.        Allergies:  Allergies  Allergen Reactions   Lisinopril Other (See Comments) and Anaphylaxis    Unknown Unknown    Family History: Family History  Family history unknown: Yes    Social History:  reports that he has never smoked. He has never used smokeless tobacco. He reports that he does not currently use drugs. He reports that he does not drink alcohol.   Physical Exam: BP 135/80   Pulse 91   Ht 6\' 3"  (1.905 m)   Wt 251 lb 2 oz (113.9 kg)   BMI 31.39 kg/m   Constitutional:  Alert and oriented, No acute distress. HEENT: [place] AT, moist mucus membranes.  Trachea midline, no masses. Neurologic: Grossly intact, no focal deficits, moving all 4 extremities. Psychiatric: Normal mood and affect.   Assessment & Plan:    1. Prostate cancer - Most recent PSA is 5.9,down from his previous of 6.6, which is reassuring - Continue monitoring PSA levels.  - Follow up in six months with PSA and BMP.  2. Recurrent UTI/ Chronic hydronephrosis - Presumably related to reflux - Continue Flomax - Creatinine remained stable as of 10/31/2022 at 1.62 -  Imaging in 10/2022 showed chronic, stable, mild to moderate left hydronephrosis, unchanged - Will continue to engage in a conservative approach and only treat for clinical symptoms as it relates to urinary tract infections  3. Nocturia  - He gets up several times a night and will have an accident if not woken up at nighttime.  - Continue Flomax - This is a reasonable intervention. He seems to have very little bother by this.   Return in about 6 months (around 08/11/2023) for repeat PSA and BMP.  I have reviewed the above documentation for accuracy and completeness, and I agree with the above.   Jason Scotland, MD   [facility] [address],  [address] [place], [place] [address] [phone]  I spent 34 total minutes on the day of the encounter including pre-visit review of the medical record, face-to-face time with the patient, and post visit ordering of labs/imaging/tests.

## 2023-07-09 ENCOUNTER — Other Ambulatory Visit: Payer: Self-pay | Admitting: Urology

## 2023-08-06 ENCOUNTER — Other Ambulatory Visit: Payer: Self-pay

## 2023-08-10 ENCOUNTER — Ambulatory Visit (INDEPENDENT_AMBULATORY_CARE_PROVIDER_SITE_OTHER): Payer: Self-pay | Admitting: Urology

## 2023-08-10 VITALS — BP 132/80 | HR 81 | Ht 75.0 in | Wt 254.0 lb

## 2023-08-10 DIAGNOSIS — R972 Elevated prostate specific antigen [PSA]: Secondary | ICD-10-CM | POA: Diagnosis not present

## 2023-08-10 DIAGNOSIS — R3914 Feeling of incomplete bladder emptying: Secondary | ICD-10-CM

## 2023-08-10 DIAGNOSIS — N401 Enlarged prostate with lower urinary tract symptoms: Secondary | ICD-10-CM

## 2023-08-10 DIAGNOSIS — N390 Urinary tract infection, site not specified: Secondary | ICD-10-CM | POA: Diagnosis not present

## 2023-08-10 DIAGNOSIS — R351 Nocturia: Secondary | ICD-10-CM

## 2023-08-10 LAB — BLADDER SCAN AMB NON-IMAGING: Scan Result: 119

## 2023-08-10 NOTE — Progress Notes (Signed)
 I,Amy L Pierron,acting as a scribe for [name], MD.,have documented all relevant documentation on the behalf of [name], MD,as directed by  [name], MD while in the presence of [name], MD.  08/10/2023 12:29 PM   Jason Reed [birth year] [id number]  Referring provider: Leanna Sato, MD [address] [place],  [place] [address]  Chief Complaint  Patient presents with   Prostate Cancer    HPI: 63 year-old male with a personal history of prostate cancer under low-risk active surveillance and recurrent UTIs with chronic hydronephrosis, presumably related to reflux presents for a six-month follow-up.  He has a complicated medical history including solitary left kidney with chronic hydronephrosis, recurrent UTIs, incomplete bladder emptying, and now low-risk prostate cancer. He ultimately underwent biopsy on 06/23/2022, revealing a 54 gram prostate. He had a funnel-shaped opening consistent with probable previous prostate intervention. He had low volume, low risk prostate cancer, Gleason 3+3 in only 1 of 12 cores, 4% involvement.   He is currently on Flomax.   He saw nephrology in March and had a BMP in February showing stable creatinine at 1.52.   His PSA was drawn today and is pending.  He reports no new bladder issues or infections since the last visit and feels he is emptying his bladder well. He denies any urinary problems, burning, or blood in urine. He continues to get up at night.   Results for orders placed or performed in visit on 08/10/23  Bladder Scan (Post Void Residual) in office  Result Value Ref Range   Scan Result 119 ml     PMH: Past Medical History:  Diagnosis Date   Diabetes (HCC)    HTN (hypertension)    Hyperlipidemia    Psychosis (HCC)    Seizure (HCC)     Surgical History: Past Surgical History:  Procedure Laterality Date   COLONOSCOPY WITH PROPOFOL N/A 12/22/2018   Procedure: COLONOSCOPY WITH PROPOFOL;  Surgeon:  Pasty Spillers, MD;  Location: [facility] END[facility];  Service: Endoscopy;  Laterality: N/A;   COLONOSCOPY WITH PROPOFOL N/A 12/23/2018   Procedure: COLONOSCOPY WITH PROPOFOL;  Surgeon: Pasty Spillers, MD;  Location: [facility];  Service: Endoscopy;  Laterality: N/A;   ESOPHAGOGASTRODUODENOSCOPY (EGD) WITH PROPOFOL N/A 12/22/2018   Procedure: ESOPHAGOGASTRODUODENOSCOPY (EGD) WITH PROPOFOL;  Surgeon: Pasty Spillers, MD;  Location: [facility] END[facility];  Service: Endoscopy;  Laterality: N/A;    Home Medications:  Allergies as of 08/10/2023       Reactions   Lisinopril Other (See Comments), Anaphylaxis   Unknown Unknown        Medication List        Accurate as of August 10, 2023 12:29 PM. If you have any questions, ask your nurse or doctor.          STOP taking these medications    docusate sodium 100 MG capsule Commonly known as: COLACE       TAKE these medications    acetaminophen 325 MG tablet Commonly known as: TYLENOL Take 650 mg by mouth every 6 (six) hours as needed.   ARIPiprazole 15 MG tablet Commonly known as: ABILIFY Take 15 mg by mouth daily.   aspirin EC 81 MG tablet Take 81 mg by mouth daily.   cholecalciferol 25 MCG (1000 UNIT) tablet Commonly known as: VITAMIN D3 Take 1,000 Units by mouth daily.   clonazePAM 1 MG tablet Commonly known as: KLONOPIN Take 1 mg by mouth at bedtime.   Dermacloud Oint Apply topically. As  needed   diphenhydrAMINE 25 MG tablet Commonly known as: SOMINEX Take 25 mg by mouth at bedtime as needed for sleep.   eucerin lotion Apply topically as needed for dry skin.   gabapentin 300 MG capsule Commonly known as: NEURONTIN Take 300 mg by mouth 3 (three) times daily.   guaiFENesin-dextromethorphan 100-10 MG/5ML syrup Commonly known as: ROBITUSSIN DM Take 10 mLs by mouth every 6 (six) hours as needed for cough.   Iron 325 (65 Fe) MG Tabs Take 1 tablet by mouth daily.   loratadine 10 MG tablet Commonly  known as: CLARITIN Take 10 mg by mouth daily.   losartan 25 MG tablet Commonly known as: COZAAR Take 25 mg by mouth daily.   melatonin 5 MG Tabs Take 5 mg by mouth.   mirtazapine 15 MG tablet Commonly known as: REMERON Take 15 mg by mouth at bedtime.   phenytoin 100 MG ER capsule Commonly known as: DILANTIN Take 100-300 mg by mouth 2 (two) times daily. Take 100 mg by mouth in the morning and 300 mg by mouth at bedtime.   polyethylene glycol 17 g packet Commonly known as: MIRALAX / GLYCOLAX Take 17 g by mouth 2 (two) times daily.   QUEtiapine 400 MG tablet Commonly known as: SEROQUEL Take 800 mg by mouth at bedtime.   simvastatin 20 MG tablet Commonly known as: ZOCOR Take 20 mg by mouth at bedtime.   tamsulosin 0.4 MG Caps capsule Commonly known as: FLOMAX TAKE 1 CAPSULE BY MOUTH DAILY   traZODone 100 MG tablet Commonly known as: DESYREL Take 100 mg by mouth at bedtime.        Allergies:  Allergies  Allergen Reactions   Lisinopril Other (See Comments) and Anaphylaxis    Unknown Unknown    Family History: Family History  Family history unknown: Yes    Social History:  reports that he has never smoked. He has never used smokeless tobacco. He reports that he does not currently use drugs. He reports that he does not drink alcohol.   Physical Exam: BP 132/80   Pulse 81   Ht 6\' 3"  (1.905 m)   Wt 254 lb (115.2 kg)   BMI 31.75 kg/m   Constitutional:  Alert and oriented, No acute distress. HEENT: [place] AT, moist mucus membranes.  Trachea midline, no masses. GU: No prostate nodularity.  Neurologic: Grossly intact, no focal deficits, moving all 4 extremities. Psychiatric: Normal mood and affect.   Assessment & Plan:    1. Prostate cancer  - Low-risk on active surveillance.  - PSA drawn today and awaiting final result. If his PSA rises, will consider a prostate MRI.   2. Recurrent UTI's and chronic hydronephrosis (solitary left kidney)  - Presumably  related to reflux, continuing Flomax.   - Unable to get a creatinine today due to limited blood volume from phlebotomy.  - A bladder scan was performed today, showing a post-void residual (PVR) of 120 mL, indicating reasonable bladder emptying.   - Conservative management will continue unless renal function becomes more compromised.  3. Nocturia  - Continue taking Flomax.  Return in about 6 months (around 02/09/2024).  I have reviewed the above documentation for accuracy and completeness, and I agree with the above.   [name], MD   [facility] [address] [place], [place] [address] [phone]

## 2023-08-11 LAB — PSA: Prostate Specific Ag, Serum: 8.7 ng/mL — ABNORMAL HIGH (ref 0.0–4.0)

## 2023-08-23 ENCOUNTER — Other Ambulatory Visit: Payer: Self-pay | Admitting: Urology

## 2023-08-23 DIAGNOSIS — C61 Malignant neoplasm of prostate: Secondary | ICD-10-CM

## 2023-08-30 ENCOUNTER — Telehealth: Payer: Self-pay

## 2023-08-30 NOTE — Telephone Encounter (Signed)
 Patient PCP called to see if a referral has been sent to our office. Informed her that there is no referral in the system but he would need a referral because it has been more then 3 years since he has been seen. Also informed them that at this time we are not scheduling new patients because our providers are leaving 10/02/2023 and we are working to get new providers in the office but I do not have any schedules at this time. Informed her it might be July or August before new patient can be scheduled. She states she will talk to the provider and see what they want to do

## 2023-10-04 ENCOUNTER — Encounter: Payer: Self-pay | Admitting: *Deleted

## 2023-10-12 ENCOUNTER — Other Ambulatory Visit: Payer: Self-pay | Admitting: Urology

## 2023-10-29 ENCOUNTER — Ambulatory Visit: Payer: Self-pay | Admitting: Urology

## 2023-11-15 NOTE — Telephone Encounter (Signed)
 Clara came into the office today to schedule his Prostate MRI. She called [facility] and they scheduled it for Monday 7/21 at 9:00 am. Clara said that if he has to do the fleets enema then he will need to be put into the hospital. Also they want to know if the MRI is with or without Contrast?

## 2023-11-22 ENCOUNTER — Ambulatory Visit: Admission: RE | Admit: 2023-11-22 | Source: Ambulatory Visit

## 2023-11-22 ENCOUNTER — Other Ambulatory Visit: Payer: Self-pay | Admitting: Urology

## 2023-11-22 DIAGNOSIS — C61 Malignant neoplasm of prostate: Secondary | ICD-10-CM

## 2023-11-25 ENCOUNTER — Ambulatory Visit: Admission: RE | Admit: 2023-11-25 | Source: Ambulatory Visit

## 2023-12-01 ENCOUNTER — Ambulatory Visit
Admission: RE | Admit: 2023-12-01 | Discharge: 2023-12-01 | Disposition: A | Discharge status: Home / Self Care | Source: Ambulatory Visit | Attending: Urology | Admitting: Urology

## 2023-12-01 DIAGNOSIS — C61 Malignant neoplasm of prostate: Secondary | ICD-10-CM | POA: Diagnosis present

## 2023-12-01 MED ORDER — GADOBUTROL 1 MMOL/ML IV SOLN
10.0000 mL | Freq: Once | INTRAVENOUS | Status: AC | PRN
  Administered 2023-12-01: 10 mL via INTRAVENOUS

## 2023-12-12 ENCOUNTER — Ambulatory Visit: Payer: Self-pay | Admitting: Urology

## 2023-12-16 ENCOUNTER — Encounter: Payer: Self-pay | Admitting: Urology

## 2023-12-28 ENCOUNTER — Ambulatory Visit: Admitting: Physician Assistant

## 2023-12-29 DIAGNOSIS — Q600 Renal agenesis, unilateral: Secondary | ICD-10-CM | POA: Insufficient documentation

## 2023-12-29 DIAGNOSIS — C61 Malignant neoplasm of prostate: Secondary | ICD-10-CM | POA: Insufficient documentation

## 2023-12-29 NOTE — Assessment & Plan Note (Deleted)
 Solitary Left kidney  Hx of CKD, rUTI, bladder dysfunction + chronic Left hydronephrosis   - repeat BMP - PVR today

## 2023-12-29 NOTE — Assessment & Plan Note (Deleted)
 Low risk CaP (dx Feb 2024)  GS6 in 1/12 cores, up to 4% On AS   Last PSA 8.7 (Apr 2025) Last biopsy (Feb 2024) - GG1 in 1/12 cores w/ 4% involvement Last MRI (12/11/23) - PIRADS 4 at left mid/apical PZ  I reviewed his prior clinical history, lab data and recent MRI findings. I recommend proceeding with an interval biopsy. We will perform an MRI fusion biopsy if radiology is able to target/map the ROI within [facility].

## 2023-12-29 NOTE — Progress Notes (Deleted)
   01/10/2024 10:16 AM   Rome Essex [birth year] [id number]  Reason for visit: Follow up low risk CaP   HPI: Initial visit with me today, previously seen by Dr. Penne for low risk CaP on AS  Prior HPI: Previously followed by Dr. Penne for low risk CaP on AS. Last seen in Apr 2025.  06/23/22 - 12-core TRUS bx, 54g gland - GG1 in 1/12 cores w/ 4% involvement  Complicated medical history including solitary left kidney with chronic hydronephrosis, recurrent UTIs, incomplete bladder emptying   Physical Exam: There were no vitals taken for this visit.   Constitutional:  Alert and oriented, No acute distress.  GU: ***  Laboratory Data: 08/10/23 - PSA 8.7 12/31/22 - PSA 5.9 06/11/20 - PSA 6.6 11/30/19 - PSA 5.0  Cr 1.52 (Feb 2025)   Pertinent Imaging: I have personally viewed and interpreted the MRI Prostate (12/11/23)  IMPRESSION: 1. 6 mm multiparametric signal abnormality within the mid to apical left peripheral zone is considered PI-RADS(v2.1)-4. 2.  No evidence of locally advanced or pelvic metastatic disease. 3. Mild limitations as detailed above. 4. (I was unable to post-process this exam in the DynaCAD application as it was not appropriately and completely on the server. This will be performed if and when the study is available.   Assessment & Plan:    Prostate cancer [facility]) Assessment & Plan: Low risk CaP (dx Feb 2024)  GS6 in 1/12 cores, up to 4% On AS   Last PSA 8.7 (Apr 2025) Last biopsy (Feb 2024) - GG1 in 1/12 cores w/ 4% involvement Last MRI (12/11/23) - PIRADS 4 at left mid/apical PZ  I reviewed his prior clinical history, lab data and recent MRI findings. I recommend proceeding with an interval biopsy. We will perform an MRI fusion biopsy if radiology is able to target/map the ROI within Dynacad.    Congenital solitary kidney Assessment & Plan: Solitary Left kidney  Hx of CKD, rUTI, bladder dysfunction + chronic Left hydronephrosis   - repeat BMP -  PVR today        Penne JONELLE Skye, MD  [facility] Urology [address] [place], [place] [address] [phone]

## 2024-01-05 ENCOUNTER — Other Ambulatory Visit

## 2024-01-05 DIAGNOSIS — N401 Enlarged prostate with lower urinary tract symptoms: Secondary | ICD-10-CM

## 2024-01-05 DIAGNOSIS — R972 Elevated prostate specific antigen [PSA]: Secondary | ICD-10-CM

## 2024-01-06 LAB — PSA: Prostate Specific Ag, Serum: 10.8 ng/mL — ABNORMAL HIGH (ref 0.0–4.0)

## 2024-01-10 ENCOUNTER — Ambulatory Visit: Admitting: Urology

## 2024-01-10 DIAGNOSIS — C61 Malignant neoplasm of prostate: Secondary | ICD-10-CM

## 2024-01-10 DIAGNOSIS — Q600 Renal agenesis, unilateral: Secondary | ICD-10-CM

## 2024-01-11 ENCOUNTER — Ambulatory Visit: Admitting: Urology

## 2024-02-23 ENCOUNTER — Telehealth: Payer: Self-pay | Admitting: Urology

## 2024-02-23 NOTE — Telephone Encounter (Signed)
 Jason Reed no-showed x 2 for his appointments to go over his prostate MRI results.  He did have a PI-RADS 4 lesion on the MRI which is concerning for clinically significant prostate cancer and he will need to have a biopsy.  Please call the patient and get him rescheduled with Dr. Georganne.

## 2024-02-24 NOTE — Telephone Encounter (Signed)
 This encounter was created in error - please disregard.

## 2024-02-24 NOTE — Progress Notes (Deleted)
   03/02/2024 4:17 PM   Rome Essex 07-22-1960 [id number]  Reason for visit: Follow up prostate Ca, solitary Left kidney, BPH, VUR   HPI: 63 y.o. 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   Constitutional:  Alert and oriented, No acute distress.  Laboratory Data: Component Ref Range & Units (hover) 1 mo ago 6 mo ago 1 yr ago 3 yr ago 4 yr ago  Prostate Specific Ag, Serum 10.8 High  8.7 High  CM 5.9 High  CM 6.6 High  CM 5.0 High  CM    Pertinent Imaging: I have personally viewed and interpreted the MRI Prostate (12/11/23) - agree with read, concerning PIRADS 4 lesion at left mid/apical PZ.  IMPRESSION: 1. 6 mm multiparametric signal abnormality within the mid to apical left peripheral zone is considered PI-RADS(v2.1)-4. 2.  No evidence of locally advanced or pelvic metastatic disease. 3. Mild limitations as detailed above. 4. (I was unable to post-process this exam in the DynaCAD application as it was not appropriately and completely on the server. This will be performed if and when the study is available.    Assessment & Plan:    Prostate cancer [facility]) Assessment & Plan: Hx of low-risk prostate Ca  - 12-core bx (Feb 2024) - GG1 in 1/12 cores up to 4%  - 54g gland   Last PSA 10.8 (Sept 2025) MRI Prostate (Aug 2025) - PIRADS 4 at Left mid/apical PZ  Reviewed his clinical history, recent PSA and MR imaging.  Recommend an MRI fusion biopsy, next available.          Penne JONELLE Skye, MD  [facility] Urology [address], [address] [place], [place] [address] [phone]

## 2024-02-24 NOTE — Assessment & Plan Note (Deleted)
 Hx of low-risk prostate Ca  - 12-core bx (Feb 2024) - GG1 in 1/12 cores up to 4%  - 54g gland   Last PSA 10.8 (Sept 2025) MRI Prostate (Aug 2025) - PIRADS 4 at Left mid/apical PZ  Reviewed his clinical history, recent PSA and MR imaging.  Recommend an MRI fusion biopsy, next available.

## 2024-03-02 ENCOUNTER — Ambulatory Visit: Admitting: Urology

## 2024-03-02 DIAGNOSIS — C61 Malignant neoplasm of prostate: Secondary | ICD-10-CM

## 2024-04-10 ENCOUNTER — Ambulatory Visit
Admission: RE | Admit: 2024-04-10 | Discharge: 2024-04-10 | Disposition: A | Attending: Gastroenterology | Admitting: Gastroenterology

## 2024-04-10 ENCOUNTER — Ambulatory Visit: Admitting: Certified Registered"

## 2024-04-10 ENCOUNTER — Encounter: Payer: Self-pay | Admitting: Gastroenterology

## 2024-04-10 ENCOUNTER — Encounter
Admission: RE | Disposition: A | Discharge status: Home / Self Care | Payer: Self-pay | Source: Home / Self Care | Attending: Gastroenterology

## 2024-04-10 HISTORY — PX: COLONOSCOPY: SHX5424

## 2024-04-10 SURGERY — COLONOSCOPY
Anesthesia: General

## 2024-04-10 MED ORDER — SODIUM CHLORIDE 0.9 % IV SOLN
INTRAVENOUS | Status: DC
  Administered 2024-04-10: 20 mL/h via INTRAVENOUS

## 2024-04-10 MED ORDER — LIDOCAINE HCL (CARDIAC) PF 100 MG/5ML IV SOSY
PREFILLED_SYRINGE | INTRAVENOUS | Status: DC | PRN
  Administered 2024-04-10: 50 mg via INTRAVENOUS

## 2024-04-10 MED ORDER — PROPOFOL 500 MG/50ML IV EMUL
INTRAVENOUS | Status: DC | PRN
  Administered 2024-04-10: 100 ug/kg/min via INTRAVENOUS

## 2024-04-10 MED ORDER — PROPOFOL 10 MG/ML IV BOLUS
INTRAVENOUS | Status: DC | PRN
  Administered 2024-04-10: 50 mg via INTRAVENOUS

## 2024-04-10 NOTE — Anesthesia Postprocedure Evaluation (Signed)
 Anesthesia Post Note  Patient: Neyland Pettengill  Procedure(s) Performed: COLONOSCOPY  Patient location during evaluation: Endoscopy Anesthesia Type: General Level of consciousness: awake and alert Pain management: pain level controlled Vital Signs Assessment: post-procedure vital signs reviewed and stable Respiratory status: spontaneous breathing, nonlabored ventilation, respiratory function stable and patient connected to nasal cannula oxygen Cardiovascular status: blood pressure returned to baseline and stable Postop Assessment: no apparent nausea or vomiting Anesthetic complications: no   No notable events documented.   Last Vitals:  Vitals:   04/10/24 1042 04/10/24 1050  BP: 138/88 (!) 143/74  Pulse: 80 79  Resp: 15 16  Temp:    SpO2: 94% 97%    Last Pain:  Vitals:   04/10/24 1030  TempSrc:   PainSc: 0-No pain                 Lendia LITTIE Mae

## 2024-04-10 NOTE — Op Note (Signed)
 [facility] Gastroenterology Patient Name: Jason Reed Procedure Date: 04/10/2024 10:09 AM MRN: [id number] Account #: [id number] Date of Birth: 11/13/1960 Admit Type: Outpatient Age: 63 Room: [facility] ENDO ROOM 1 Gender: Male Note Status: Finalized Instrument Name: Colon Scope [id number] Procedure:             Colonoscopy Indications:           Screening in patient at increased risk: Family history                         of 1st-degree relative with colorectal cancer Providers:             Ruel Kung MD, MD Referring MD:          Ruel Kung MD, MD (Referring MD), Rock EMERSON Pounds, MD                         (Referring MD) Medicines:             Monitored Anesthesia Care Complications:         No immediate complications. Procedure:             Pre-Anesthesia Assessment:                        - Prior to the procedure, a History and Physical was                         performed, and patient medications, allergies and                         sensitivities were reviewed. The patient's tolerance                         of previous anesthesia was reviewed.                        - The risks and benefits of the procedure and the                         sedation options and risks were discussed with the                         patient. All questions were answered and informed                         consent was obtained.                        - ASA Grade Assessment: II - A patient with mild                         systemic disease.                        After obtaining informed consent, the colonoscope was                         passed under direct vision. Throughout the procedure,  the patient's blood pressure, pulse, and oxygen                         saturations were monitored continuously. The                         Colonoscope was introduced through the anus with the                         intention of advancing to the cecum. The scope was                          advanced to the sigmoid colon before the procedure was                         aborted. Medications were given. The colonoscopy was                         performed with ease. The patient tolerated the                         procedure well. The quality of the bowel preparation                         was inadequate. Findings:      The perianal and digital rectal examinations were normal.      A large amount of semi-solid stool was found in the recto-sigmoid colon,       making visualization difficult. Impression:            - Preparation of the colon was inadequate.                        - Stool in the recto-sigmoid colon.                        - No specimens collected. Recommendation:        - Discharge patient to home (with escort).                        - Resume previous diet.                        - Continue present medications.                        - Repeat colonoscopy in 2 weeks because the bowel                         preparation was suboptimal. Procedure Code(s):     --- Professional ---                        [phone], 53, Colonoscopy, flexible; diagnostic,                         including collection of specimen(s) by brushing or                         washing, when performed (separate procedure) Diagnosis Code(s):     ---  Professional ---                        Z80.0, Family history of malignant neoplasm of                         digestive organs CPT copyright 2022 American Medical Association. All rights reserved. The codes documented in this report are preliminary and upon coder review may  be revised to meet current compliance requirements. Ruel Kung, MD Ruel Kung MD, MD 04/10/2024 10:26:18 AM This report has been signed electronically. Number of Addenda: 0 Note Initiated On: 04/10/2024 10:09 AM Total Procedure Duration: 0 hours 0 minutes 23 seconds  Estimated Blood Loss:  Estimated blood loss: none.      [facility]

## 2024-04-10 NOTE — Anesthesia Preprocedure Evaluation (Signed)
 Anesthesia Evaluation  Patient identified by MRN, date of birth, ID band Patient awake    Reviewed: Allergy & Precautions, NPO status , Patient's Chart, lab work & pertinent test results  Airway Mallampati: III  TM Distance: >3 FB Neck ROM: full    Dental  (+) Chipped, Poor Dentition   Pulmonary neg pulmonary ROS   Pulmonary exam normal        Cardiovascular hypertension, negative cardio ROS Normal cardiovascular exam     Neuro/Psych Seizures -,  PSYCHIATRIC DISORDERS         GI/Hepatic negative GI ROS, Neg liver ROS,,,  Endo/Other  diabetes    Renal/GU Renal disease  negative genitourinary   Musculoskeletal   Abdominal   Peds  Hematology  (+) Blood dyscrasia, anemia   Anesthesia Other Findings Past Medical History: No date: HTN (hypertension) No date: Hyperlipidemia No date: Psychosis (HCC) No date: Seizure [facility])  Past Surgical History: 12/22/2018: COLONOSCOPY WITH PROPOFOL ; N/A     Comment:  Procedure: COLONOSCOPY WITH PROPOFOL ;  Surgeon:               Janalyn Keene NOVAK, MD;  Location: [facility] ENDOSCOPY;                Service: Endoscopy;  Laterality: N/A; 12/23/2018: COLONOSCOPY WITH PROPOFOL ; N/A     Comment:  Procedure: COLONOSCOPY WITH PROPOFOL ;  Surgeon:               Janalyn Keene NOVAK, MD;  Location: [facility] ENDOSCOPY;                Service: Endoscopy;  Laterality: N/A; 12/22/2018: ESOPHAGOGASTRODUODENOSCOPY (EGD) WITH PROPOFOL ; N/A     Comment:  Procedure: ESOPHAGOGASTRODUODENOSCOPY (EGD) WITH               PROPOFOL ;  Surgeon: Janalyn Keene NOVAK, MD;  Location:               [facility] ENDOSCOPY;  Service: Endoscopy;  Laterality: N/A;  BMI    Body Mass Index: 32.10 kg/m      Reproductive/Obstetrics negative OB ROS                              Anesthesia Physical Anesthesia Plan  ASA: 3  Anesthesia Plan: General   Post-op Pain Management:    Induction:  Intravenous  PONV Risk Score and Plan: Propofol  infusion and TIVA  Airway Management Planned: Natural Airway and Nasal Cannula  Additional Equipment:   Intra-op Plan:   Post-operative Plan:   Informed Consent: I have reviewed the patients History and Physical, chart, labs and discussed the procedure including the risks, benefits and alternatives for the proposed anesthesia with the patient or authorized representative who has indicated his/her understanding and acceptance.     Dental Advisory Given  Plan Discussed with: Anesthesiologist, CRNA and Surgeon  Anesthesia Plan Comments: (Patient consented for risks of anesthesia including but not limited to:  - adverse reactions to medications - risk of airway placement if required - damage to eyes, teeth, lips or other oral mucosa - nerve damage due to positioning  - sore throat or hoarseness - Damage to heart, brain, nerves, lungs, other parts of body or loss of life  Patient voiced understanding and assent.)        Anesthesia Quick Evaluation

## 2024-04-10 NOTE — Transfer of Care (Signed)
 Immediate Anesthesia Transfer of Care Note  Patient: Jason Reed  Procedure(s) Performed: COLONOSCOPY  Patient Location: Endoscopy Unit  Anesthesia Type:General  Level of Consciousness: drowsy  Airway & Oxygen Therapy: Patient Spontanous Breathing  Post-op Assessment: Report given to RN  Post vital signs: stable  Last Vitals:  Vitals Value Taken Time  BP 129/71 04/10/24 10:30  Temp    Pulse 85 04/10/24 10:31  Resp 17 04/10/24 10:31  SpO2 95 % 04/10/24 10:31  Vitals shown include unfiled device data.  Last Pain:  Vitals:   04/10/24 1030  TempSrc:   PainSc: 0-No pain         Complications: No notable events documented.

## 2024-04-10 NOTE — H&P (Signed)
 Ruel Kung , MD [address], [address], [place], [place], [address] Phone: [phone] Fax: [phone]  Primary Care Physician:  Buren Rock HERO, MD   Pre-Procedure History & Physical: HPI:  Jason Reed is a 63 y.o. male is here for an colonoscopy.   Past Medical History:  Diagnosis Date   Diabetes (HCC)    HTN (hypertension)    Hyperlipidemia    Psychosis (HCC)    Seizure (HCC)     Past Surgical History:  Procedure Laterality Date   COLONOSCOPY WITH PROPOFOL  N/A 12/22/2018   Procedure: COLONOSCOPY WITH PROPOFOL ;  Surgeon: Janalyn Keene NOVAK, MD;  Location: [facility] ENDOSCOPY;  Service: Endoscopy;  Laterality: N/A;   COLONOSCOPY WITH PROPOFOL  N/A 12/23/2018   Procedure: COLONOSCOPY WITH PROPOFOL ;  Surgeon: Janalyn Keene NOVAK, MD;  Location: [facility] ENDOSCOPY;  Service: Endoscopy;  Laterality: N/A;   ESOPHAGOGASTRODUODENOSCOPY (EGD) WITH PROPOFOL  N/A 12/22/2018   Procedure: ESOPHAGOGASTRODUODENOSCOPY (EGD) WITH PROPOFOL ;  Surgeon: Janalyn Keene NOVAK, MD;  Location: [facility] ENDOSCOPY;  Service: Endoscopy;  Laterality: N/A;    Prior to Admission medications   Medication Sig Start Date End Date Taking? Authorizing Provider  acetaminophen  (TYLENOL ) 325 MG tablet Take 650 mg by mouth every 6 (six) hours as needed.    [provider]  ARIPiprazole  (ABILIFY ) 15 MG tablet Take 15 mg by mouth daily.    [provider]  aspirin  EC 81 MG tablet Take 81 mg by mouth daily.    [provider]  cholecalciferol (VITAMIN D) 25 MCG (1000 UNIT) tablet Take 1,000 Units by mouth daily. 09/03/20   [provider]  clonazePAM  (KLONOPIN ) 1 MG tablet Take 1 mg by mouth at bedtime.    [provider]  diphenhydrAMINE (SOMINEX) 25 MG tablet Take 25 mg by mouth at bedtime as needed for sleep.    [provider]  Emollient  (EUCERIN) lotion Apply topically as needed for dry skin.    [provider]  Ferrous Sulfate (IRON) 325 (65 Fe) MG TABS Take 1 tablet by mouth daily. 11/09/22   [provider]  gabapentin  (NEURONTIN ) 300 MG capsule Take 300 mg by mouth 3 (three) times daily.  10/25/18   [provider]  guaiFENesin -dextromethorphan  (ROBITUSSIN DM) 100-10 MG/5ML syrup Take 10 mLs by mouth every 6 (six) hours as needed for cough. 10/05/20   Patel, Sona, MD  [employer] [facility]) OINT Apply topically. As needed    [provider]  loratadine  (CLARITIN ) 10 MG tablet Take 10 mg by mouth daily.    [provider]  losartan (COZAAR) 25 MG tablet Take 25 mg by mouth daily. 09/30/20   [provider]  melatonin 5 MG TABS Take 5 mg by mouth.    [provider]  mirtazapine  (REMERON ) 15 MG tablet Take 15 mg by mouth at bedtime. 03/23/19   [provider]  phenytoin  (DILANTIN ) 100 MG ER capsule Take 100-300 mg by mouth 2 (two) times daily. Take 100 mg by mouth in the morning and 300 mg by mouth at bedtime.    [provider]  polyethylene glycol (MIRALAX  / GLYCOLAX ) 17 g packet Take 17 g by mouth 2 (two) times daily. 07/17/19   [name], MD  QUEtiapine  (SEROQUEL ) 400 MG tablet Take 800 mg by mouth at bedtime.    [provider]  simvastatin  (ZOCOR ) 20 MG tablet Take 20 mg by mouth at bedtime.    [provider]  tamsulosin  (FLOMAX ) 0.4 MG CAPS  capsule TAKE 1 CAPSULE  BY MOUTH ONCE DAILY 10/12/23   Penne Knee, MD  traZODone  (DESYREL ) 100 MG tablet Take 100 mg by mouth at bedtime.     [provider]    Allergies as of 03/08/2024 - Review Complete 10/31/2022  Allergen Reaction Noted   Lisinopril Other (See Comments) and Anaphylaxis 03/07/2014    Family History  Family history unknown: Yes    Social History   Socioeconomic History   Marital status: Single    Spouse name: Not on file    Number of children: Not on file   Years of education: Not on file   Highest education level: Not on file  Occupational History   Not on file  Tobacco Use   Smoking status: Never   Smokeless tobacco: Never  Vaping Use   Vaping status: Never Used  Substance and Sexual Activity   Alcohol use: No   Drug use: Not Currently   Sexual activity: Not Currently  Other Topics Concern   Not on file  Social History Narrative   Not on file   Social Drivers of Health   Financial Resource Strain: Patient Unable To Answer (06/09/2023)   Received from [facility]   Overall Financial Resource Strain (CARDIA)    Difficulty of Paying Living Expenses: Patient unable to answer  Food Insecurity: Patient Unable To Answer (06/09/2023)   Received from [facility]   Hunger Vital Sign    Within the past 12 months, you worried that your food would run out before you got the money to buy more.: Patient unable to answer    Within the past 12 months, the food you bought just didn't last and you didn't have money to get more.: Patient unable to answer  Transportation Needs: Patient Unable To Answer (06/09/2023)   Received from [facility] - Transportation    In the past 12 months, has lack of transportation kept you from medical appointments or from getting medications?: Patient unable to answer    Lack of Transportation (Non-Medical): Patient unable to answer  Physical Activity: Unknown (04/10/2019)   Exercise Vital Sign    Days of Exercise per Week: Patient declined    Minutes of Exercise per Session: Patient declined  Stress: Not on file  Social Connections: Not on file  Intimate Partner Violence: Not on file    Review of Systems: See HPI, otherwise negative ROS  Physical Exam: There were no vitals taken for this visit. General:   Alert,  pleasant and cooperative in NAD Head:  Normocephalic and atraumatic. Neck:  Supple; no masses or  thyromegaly. Lungs:  Clear throughout to auscultation, normal respiratory effort.    Heart:  +S1, +S2, Regular rate and rhythm, No edema. Abdomen:  Soft, nontender and nondistended. Normal bowel sounds, without guarding, and without rebound.   Neurologic:  Alert and  oriented x4;  grossly normal neurologically.  Impression/Plan: Jason Reed is here for an colonoscopy to be performed for Screening colonoscopy , family history of colon cancer.   Risks, benefits, limitations, and alternatives regarding  colonoscopy have been reviewed with the patient.  Questions have been answered.  All parties agreeable.   Ruel Kung, MD  04/10/2024, 9:44 AM

## 2024-04-11 ENCOUNTER — Other Ambulatory Visit: Payer: Self-pay

## 2024-04-11 MED ORDER — TAMSULOSIN HCL 0.4 MG PO CAPS: 0.4000 mg | ORAL_CAPSULE | Freq: Every day | ORAL | 1 refills | Status: AC

## 2024-04-20 ENCOUNTER — Encounter: Payer: Self-pay | Admitting: Physician Assistant

## 2024-04-20 ENCOUNTER — Ambulatory Visit: Admitting: Physician Assistant

## 2024-04-20 VITALS — BP 127/77 | HR 114 | Ht >= 80 in | Wt 250.0 lb

## 2024-04-20 DIAGNOSIS — C61 Malignant neoplasm of prostate: Secondary | ICD-10-CM

## 2024-04-20 NOTE — Progress Notes (Signed)
 04/20/2024 11:14 AM   Rome Essex 1960/06/08 [id number]  CC: Chief Complaint  Patient presents with   Results   HPI: Jason Reed is a 63 y.o. male with PMH low-volume, low risk prostate cancer on active surveillance, recurrent UTI, solitary left kidney, nocturia on Flomax , and chronic hydronephrosis presumed due to reflux who presents today for follow-up with repeat prostate MRI.  He is accompanied today by a caregiver, who contributes to HPI.  A repeat prostate MRI was ordered after his most recent PSA in September showed significant elevation of her prior, 10.8, previously 8.7 5 months prior.  Prostate MRI dated 12/01/2023 showed a 6 mm PI-RADS 4 lesion in the mid to apical left peripheral zone with no evidence of locally advanced or pelvic metastatic disease.  No prior prostate MRI available for comparison.  Notably, he has historically struggled with bowel prep instructions for MRI or biopsy, which has interfered with timely care.  PMH: Past Medical History:  Diagnosis Date   HTN (hypertension)    Hyperlipidemia    Psychosis (HCC)    Seizure [facility])     Surgical History: Past Surgical History:  Procedure Laterality Date   COLONOSCOPY N/A 04/10/2024   Procedure: COLONOSCOPY;  Surgeon: Therisa Bi, MD;  Location: [facility] ENDOSCOPY;  Service: Gastroenterology;  Laterality: N/A;   COLONOSCOPY WITH PROPOFOL  N/A 12/22/2018   Procedure: COLONOSCOPY WITH PROPOFOL ;  Surgeon: Janalyn Keene NOVAK, MD;  Location: [facility] ENDOSCOPY;  Service: Endoscopy;  Laterality: N/A;   COLONOSCOPY WITH PROPOFOL  N/A 12/23/2018   Procedure: COLONOSCOPY WITH PROPOFOL ;  Surgeon: Janalyn Keene NOVAK, MD;  Location: [facility] ENDOSCOPY;  Service: Endoscopy;  Laterality: N/A;   ESOPHAGOGASTRODUODENOSCOPY (EGD) WITH PROPOFOL  N/A 12/22/2018   Procedure: ESOPHAGOGASTRODUODENOSCOPY (EGD) WITH PROPOFOL ;  Surgeon: Janalyn Keene NOVAK, MD;  Location: [facility] ENDOSCOPY;  Service: Endoscopy;  Laterality: N/A;    Home  Medications:  Allergies as of 04/20/2024       Reactions   Lisinopril Other (See Comments), Anaphylaxis   Unknown Unknown        Medication List        Accurate as of April 20, 2024 11:14 AM. If you have any questions, ask your nurse or doctor.          acetaminophen  325 MG tablet Commonly known as: TYLENOL  Take 650 mg by mouth every 6 (six) hours as needed.   ARIPiprazole  15 MG tablet Commonly known as: ABILIFY  Take 15 mg by mouth daily.   aspirin  EC 81 MG tablet Take 81 mg by mouth daily.   cholecalciferol 25 MCG (1000 UNIT) tablet Commonly known as: VITAMIN D3 Take 1,000 Units by mouth daily.   clonazePAM  1 MG tablet Commonly known as: KLONOPIN  Take 1 mg by mouth at bedtime.   Dermacloud Oint Apply topically. As needed   diphenhydrAMINE 25 MG tablet Commonly known as: SOMINEX Take 25 mg by mouth at bedtime as needed for sleep.   eucerin lotion Apply topically as needed for dry skin.   gabapentin  300 MG capsule Commonly known as: NEURONTIN  Take 300 mg by mouth 3 (three) times daily.   guaiFENesin -dextromethorphan  100-10 MG/5ML syrup Commonly known as: ROBITUSSIN DM Take 10 mLs by mouth every 6 (six) hours as needed for cough.   Iron 325 (65 Fe) MG Tabs Take 1 tablet by mouth daily.   loratadine  10 MG tablet Commonly known as: CLARITIN  Take 10 mg by mouth daily.   losartan 25 MG tablet Commonly known as: COZAAR Take 25 mg by mouth daily.  melatonin 5 MG Tabs Take 5 mg by mouth.   mirtazapine  15 MG tablet Commonly known as: REMERON  Take 15 mg by mouth at bedtime.   phenytoin  100 MG ER capsule Commonly known as: DILANTIN  Take 100-300 mg by mouth 2 (two) times daily. Take 100 mg by mouth in the morning and 300 mg by mouth at bedtime.   polyethylene glycol 17 g packet Commonly known as: MIRALAX  / GLYCOLAX  Take 17 g by mouth 2 (two) times daily.   QUEtiapine  400 MG tablet Commonly known as: SEROQUEL  Take 800 mg by mouth at  bedtime.   simvastatin  20 MG tablet Commonly known as: ZOCOR  Take 20 mg by mouth at bedtime.   tamsulosin  0.4 MG Caps capsule Commonly known as: FLOMAX  Take 1 capsule (0.4 mg total) by mouth daily.   traZODone  100 MG tablet Commonly known as: DESYREL  Take 100 mg by mouth at bedtime.        Allergies:  Allergies[1]  Family History: Family History  Family history unknown: Yes    Social History:   reports that he has never smoked. He has never used smokeless tobacco. He reports that he does not currently use drugs. He reports that he does not drink alcohol.  Physical Exam: BP 127/77   Pulse (!) 114   Ht 6' 8 (2.032 m)   Wt 250 lb (113.4 kg)   BMI 27.46 kg/m   Constitutional:  Alert, no acute distress, nontoxic appearing HEENT: [place], AT Cardiovascular: No clubbing, cyanosis, or edema Respiratory: Normal respiratory effort, no increased work of breathing Skin: No rashes, bruises or suspicious lesions Neurologic: Grossly intact, no focal deficits, moving all 4 extremities Psychiatric: Normal mood and affect  Laboratory Data: Results for orders placed or performed in visit on 01/05/24  PSA   Collection Time: 01/05/24 10:18 AM  Result Value Ref Range   Prostate Specific Ag, Serum 10.8 (H) 0.0 - 4.0 ng/mL   Assessment & Plan:   1. Prostate cancer (HCC) (Primary) Small, 6 mm PI-RADS 4 lesion on prostate MRI.  I suspect this is his known, low risk low-volume prostate cancer.  Fortunately, no evidence of spread outside the prostate.  We discussed that there is an approximate 35% risk of upgrading his risk on subsequent biopsy per PASS risk calculator.  We also discussed that there are benign causes of fluctuations in PSA.  We discussed various options including repeat biopsy versus repeat PSA and prostate MRI in 3 months, only pursuing repeat biopsy if there is evident progression of disease biochemically and on imaging.  Caregiver would like to discuss these options  further with his group home staff so that they can all make this decision together.  I typed up our conversation for his AVS today.  They will call me when they decide how they would like to proceed.  Return for Call me with desired next steps.  Lucie Hones, PA-C  [facility] [place] [address] [place], [place] [address] [phone]      [1]  Allergies Allergen Reactions   Lisinopril Other (See Comments) and Anaphylaxis    Unknown Unknown

## 2024-04-20 NOTE — Patient Instructions (Addendum)
 We know that you have a very slow-growing, nonaggressive prostate cancer.  That is why we keep following your PSA blood test.  Your PSA rose quite a bit this year, and so we got a prostate MRI to see if there is any evidence of your prostate cancer becoming more aggressive.  If it did become more aggressive, we would want to treat it and not just monitor it with blood testing alone.  Your prostate MRI showed a very small area on your prostate, 6 mm in diameter, that I fully expect is a tumor within your prostate.  Unfortunately, I do not have a prior prostate MRI to compare against this, so I cannot say if this tumor has grown or changed compared to when your prostate cancer was first diagnosed from biopsy.  The good news is that there is no evidence that your prostate cancer has spread outside of your prostate.  Keep in mind that PSA blood testing is not perfect.  PSA values can change for a lot of reasons including when you have a urinary tract infection, or have recently ejaculated, or are constipated.  Therefore, while your PSA rose this year, that test alone does not tell us  what to do next, and it having risen could have nothing to do with your prostate cancer.  At this point we have a couple of options: Option 1: Get a repeat prostate biopsy now to see for sure if your cancer is becoming more aggressive and we need to start treatment.  There is only about a 35% chance that this would happen based on your current data. Option 2: Repeat a PSA blood test and prostate MRI in 3 months so that we can get a better sense of how things are growing or changing, if at all.  Then considering a repeat prostate biopsy only if things look like they are worsening.  Please give me a call in clinic to let me know how you would like to proceed.
